# Patient Record
Sex: Female | Born: 1935 | Race: White | Hispanic: No | Marital: Married | Smoking: Never smoker
Health system: Southern US, Community
[De-identification: ages and names within clinical notes are randomized; demographics above are authoritative.]

## PROBLEM LIST (undated history)

## (undated) DIAGNOSIS — F0390 Unspecified dementia without behavioral disturbance: Secondary | ICD-10-CM

## (undated) DIAGNOSIS — F329 Major depressive disorder, single episode, unspecified: Secondary | ICD-10-CM

## (undated) DIAGNOSIS — J309 Allergic rhinitis, unspecified: Secondary | ICD-10-CM

## (undated) DIAGNOSIS — R32 Unspecified urinary incontinence: Secondary | ICD-10-CM

## (undated) DIAGNOSIS — F32A Depression, unspecified: Secondary | ICD-10-CM

## (undated) DIAGNOSIS — F1011 Alcohol abuse, in remission: Secondary | ICD-10-CM

## (undated) DIAGNOSIS — I6381 Other cerebral infarction due to occlusion or stenosis of small artery: Secondary | ICD-10-CM

## (undated) DIAGNOSIS — M24411 Recurrent dislocation, right shoulder: Secondary | ICD-10-CM

## (undated) DIAGNOSIS — G8929 Other chronic pain: Secondary | ICD-10-CM

## (undated) DIAGNOSIS — S065X9A Traumatic subdural hemorrhage with loss of consciousness of unspecified duration, initial encounter: Secondary | ICD-10-CM

## (undated) DIAGNOSIS — E039 Hypothyroidism, unspecified: Secondary | ICD-10-CM

## (undated) DIAGNOSIS — J45909 Unspecified asthma, uncomplicated: Secondary | ICD-10-CM

## (undated) DIAGNOSIS — K219 Gastro-esophageal reflux disease without esophagitis: Secondary | ICD-10-CM

## (undated) DIAGNOSIS — R1312 Dysphagia, oropharyngeal phase: Secondary | ICD-10-CM

## (undated) DIAGNOSIS — F1010 Alcohol abuse, uncomplicated: Secondary | ICD-10-CM

## (undated) DIAGNOSIS — G4700 Insomnia, unspecified: Secondary | ICD-10-CM

## (undated) DIAGNOSIS — M47812 Spondylosis without myelopathy or radiculopathy, cervical region: Secondary | ICD-10-CM

## (undated) DIAGNOSIS — E785 Hyperlipidemia, unspecified: Secondary | ICD-10-CM

## (undated) DIAGNOSIS — Z96659 Presence of unspecified artificial knee joint: Secondary | ICD-10-CM

## (undated) DIAGNOSIS — M1990 Unspecified osteoarthritis, unspecified site: Secondary | ICD-10-CM

## (undated) DIAGNOSIS — K631 Perforation of intestine (nontraumatic): Secondary | ICD-10-CM

## (undated) DIAGNOSIS — Z8679 Personal history of other diseases of the circulatory system: Secondary | ICD-10-CM

## (undated) DIAGNOSIS — M6530 Trigger finger, unspecified finger: Secondary | ICD-10-CM

## (undated) DIAGNOSIS — I951 Orthostatic hypotension: Secondary | ICD-10-CM

## (undated) DIAGNOSIS — Z8489 Family history of other specified conditions: Secondary | ICD-10-CM

## (undated) DIAGNOSIS — K56609 Unspecified intestinal obstruction, unspecified as to partial versus complete obstruction: Secondary | ICD-10-CM

## (undated) DIAGNOSIS — M069 Rheumatoid arthritis, unspecified: Secondary | ICD-10-CM

## (undated) DIAGNOSIS — R296 Repeated falls: Secondary | ICD-10-CM

## (undated) DIAGNOSIS — S065XAA Traumatic subdural hemorrhage with loss of consciousness status unknown, initial encounter: Secondary | ICD-10-CM

## (undated) DIAGNOSIS — I10 Essential (primary) hypertension: Secondary | ICD-10-CM

## (undated) HISTORY — DX: Unspecified osteoarthritis, unspecified site: M19.90

## (undated) HISTORY — DX: Gastro-esophageal reflux disease without esophagitis: K21.9

## (undated) HISTORY — DX: Major depressive disorder, single episode, unspecified: F32.9

## (undated) HISTORY — DX: Trigger finger, unspecified finger: M65.30

## (undated) HISTORY — DX: Rheumatoid arthritis, unspecified: M06.9

## (undated) HISTORY — DX: Allergic rhinitis, unspecified: J30.9

## (undated) HISTORY — DX: Hyperlipidemia, unspecified: E78.5

## (undated) HISTORY — DX: Hypothyroidism, unspecified: E03.9

## (undated) HISTORY — DX: Unspecified asthma, uncomplicated: J45.909

## (undated) HISTORY — PX: CARPAL TUNNEL RELEASE: SHX101

## (undated) HISTORY — PX: TOTAL KNEE ARTHROPLASTY: SHX125

## (undated) HISTORY — DX: Presence of unspecified artificial knee joint: Z96.659

## (undated) HISTORY — DX: Alcohol abuse, uncomplicated: F10.10

## (undated) HISTORY — DX: Other cerebral infarction due to occlusion or stenosis of small artery: I63.81

## (undated) HISTORY — DX: Essential (primary) hypertension: I10

## (undated) HISTORY — DX: Insomnia, unspecified: G47.00

## (undated) HISTORY — PX: ABDOMINAL HYSTERECTOMY: SHX81

## (undated) HISTORY — DX: Alcohol abuse, in remission: F10.11

## (undated) HISTORY — DX: Spondylosis without myelopathy or radiculopathy, cervical region: M47.812

## (undated) HISTORY — PX: BREAST SURGERY: SHX581

## (undated) HISTORY — DX: Recurrent dislocation, right shoulder: M24.411

## (undated) HISTORY — DX: Unspecified urinary incontinence: R32

## (undated) HISTORY — DX: Orthostatic hypotension: I95.1

## (undated) HISTORY — DX: Depression, unspecified: F32.A

## (undated) HISTORY — DX: Dysphagia, oropharyngeal phase: R13.12

## (undated) HISTORY — DX: Personal history of other diseases of the circulatory system: Z86.79

## (undated) HISTORY — PX: VAGINAL PROLAPSE REPAIR: SHX830

---

## 1996-06-01 HISTORY — PX: OTHER SURGICAL HISTORY: SHX169

## 1998-09-30 ENCOUNTER — Inpatient Hospital Stay (HOSPITAL_COMMUNITY): Admission: RE | Admit: 1998-09-30 | Discharge: 1998-10-05 | Payer: Self-pay | Admitting: Orthopedic Surgery

## 1998-09-30 HISTORY — PX: TOTAL KNEE ARTHROPLASTY: SHX125

## 1998-10-02 ENCOUNTER — Encounter: Payer: Self-pay | Admitting: Orthopedic Surgery

## 1998-11-12 ENCOUNTER — Encounter: Admission: RE | Admit: 1998-11-12 | Discharge: 1998-12-26 | Payer: Self-pay | Admitting: Orthopedic Surgery

## 1999-02-11 ENCOUNTER — Other Ambulatory Visit: Admission: RE | Admit: 1999-02-11 | Discharge: 1999-02-11 | Payer: Self-pay | Admitting: Family Medicine

## 1999-04-29 ENCOUNTER — Ambulatory Visit (HOSPITAL_BASED_OUTPATIENT_CLINIC_OR_DEPARTMENT_OTHER): Admission: RE | Admit: 1999-04-29 | Discharge: 1999-04-29 | Payer: Self-pay | Admitting: Orthopedic Surgery

## 1999-06-02 HISTORY — PX: CATARACT EXTRACTION: SUR2

## 1999-06-04 ENCOUNTER — Encounter: Admission: RE | Admit: 1999-06-04 | Discharge: 1999-07-25 | Payer: Self-pay | Admitting: Orthopedic Surgery

## 1999-08-21 ENCOUNTER — Encounter: Payer: Self-pay | Admitting: Family Medicine

## 1999-08-21 ENCOUNTER — Encounter: Admission: RE | Admit: 1999-08-21 | Discharge: 1999-08-21 | Payer: Self-pay | Admitting: Family Medicine

## 2002-02-07 ENCOUNTER — Encounter: Payer: Self-pay | Admitting: *Deleted

## 2002-02-07 ENCOUNTER — Encounter: Admission: RE | Admit: 2002-02-07 | Discharge: 2002-02-07 | Payer: Self-pay | Admitting: Family Medicine

## 2002-02-08 ENCOUNTER — Ambulatory Visit (HOSPITAL_BASED_OUTPATIENT_CLINIC_OR_DEPARTMENT_OTHER): Admission: RE | Admit: 2002-02-08 | Discharge: 2002-02-08 | Payer: Self-pay | Admitting: *Deleted

## 2002-07-31 HISTORY — PX: TOTAL KNEE ARTHROPLASTY: SHX125

## 2002-08-09 ENCOUNTER — Inpatient Hospital Stay (HOSPITAL_COMMUNITY): Admission: RE | Admit: 2002-08-09 | Discharge: 2002-08-13 | Payer: Self-pay | Admitting: Orthopedic Surgery

## 2002-08-11 ENCOUNTER — Encounter: Payer: Self-pay | Admitting: Orthopedic Surgery

## 2002-09-26 ENCOUNTER — Encounter: Admission: RE | Admit: 2002-09-26 | Discharge: 2002-10-16 | Payer: Self-pay | Admitting: Orthopedic Surgery

## 2005-06-01 HISTORY — PX: LUMBAR LAMINECTOMY: SHX95

## 2005-10-13 ENCOUNTER — Ambulatory Visit (HOSPITAL_COMMUNITY): Admission: RE | Admit: 2005-10-13 | Discharge: 2005-10-13 | Payer: Self-pay | Admitting: Neurosurgery

## 2005-11-04 ENCOUNTER — Encounter: Admission: RE | Admit: 2005-11-04 | Discharge: 2005-11-04 | Payer: Self-pay | Admitting: Neurosurgery

## 2005-11-30 ENCOUNTER — Encounter: Admission: RE | Admit: 2005-11-30 | Discharge: 2005-11-30 | Payer: Self-pay | Admitting: Neurosurgery

## 2006-11-13 ENCOUNTER — Emergency Department (HOSPITAL_COMMUNITY): Admission: EM | Admit: 2006-11-13 | Discharge: 2006-11-14 | Payer: Self-pay | Admitting: Emergency Medicine

## 2006-11-17 ENCOUNTER — Ambulatory Visit: Payer: Self-pay | Admitting: Psychiatry

## 2006-11-17 ENCOUNTER — Other Ambulatory Visit: Payer: Self-pay | Admitting: Emergency Medicine

## 2006-11-17 ENCOUNTER — Inpatient Hospital Stay (HOSPITAL_COMMUNITY): Admission: AD | Admit: 2006-11-17 | Discharge: 2006-11-22 | Payer: Self-pay | Admitting: Psychiatry

## 2007-03-10 ENCOUNTER — Encounter: Payer: Self-pay | Admitting: Internal Medicine

## 2007-03-10 ENCOUNTER — Inpatient Hospital Stay (HOSPITAL_COMMUNITY): Admission: EM | Admit: 2007-03-10 | Discharge: 2007-03-12 | Payer: Self-pay | Admitting: Emergency Medicine

## 2007-03-10 LAB — CONVERTED CEMR LAB
BUN: 15 mg/dL
CO2: 90 meq/L
Calcium: 9.3 mg/dL
Creatinine, Ser: 0.94 mg/dL
Potassium: 4.6 meq/L
Sodium: 141 meq/L

## 2007-03-28 ENCOUNTER — Ambulatory Visit (HOSPITAL_COMMUNITY): Admission: RE | Admit: 2007-03-28 | Discharge: 2007-03-28 | Payer: Self-pay | Admitting: Neurosurgery

## 2007-05-29 ENCOUNTER — Encounter: Payer: Self-pay | Admitting: Internal Medicine

## 2007-05-29 ENCOUNTER — Emergency Department (HOSPITAL_COMMUNITY): Admission: EM | Admit: 2007-05-29 | Discharge: 2007-05-30 | Payer: Self-pay | Admitting: Emergency Medicine

## 2007-05-29 LAB — CONVERTED CEMR LAB
AST: 16 units/L
Alkaline Phosphatase: 82 units/L
BUN: 21 mg/dL
CO2: 28 meq/L
Creatinine, Ser: 1.05 mg/dL
Glucose, Bld: 117 mg/dL
HCT: 32.4 %
Lymphocytes, automated: 8 %
MCV: 92.1 fL
Monocytes Relative: 11 %
Neutrophils Relative %: 80 %
Platelets: 416 10*3/uL
RDW: 16.2 %

## 2008-07-04 ENCOUNTER — Encounter: Payer: Self-pay | Admitting: Internal Medicine

## 2008-07-04 LAB — CONVERTED CEMR LAB
Cholesterol: 224 mg/dL
HDL: 56 mg/dL
LDL Cholesterol: 137 mg/dL
Triglyceride fasting, serum: 172 mg/dL

## 2008-07-16 ENCOUNTER — Encounter: Admission: RE | Admit: 2008-07-16 | Discharge: 2008-07-16 | Payer: Self-pay | Admitting: Internal Medicine

## 2008-11-07 ENCOUNTER — Encounter: Payer: Self-pay | Admitting: Internal Medicine

## 2008-11-07 LAB — CONVERTED CEMR LAB
ALT: 25 units/L
AST: 20 units/L
CO2: 24 meq/L
Glucose, Bld: 71 mg/dL
Potassium: 4.2 meq/L
Sodium: 141 meq/L

## 2009-03-25 ENCOUNTER — Encounter: Admission: RE | Admit: 2009-03-25 | Discharge: 2009-03-25 | Payer: Self-pay | Admitting: Neurosurgery

## 2009-07-05 ENCOUNTER — Encounter: Payer: Self-pay | Admitting: Internal Medicine

## 2009-07-05 LAB — CONVERTED CEMR LAB
Albumin: 3.6 g/dL
Alkaline Phosphatase: 69 units/L
CO2: 19 meq/L
Calcium: 10.4 mg/dL
Cholesterol: 259 mg/dL
Creatinine, Ser: 1.1 mg/dL
Glucose, Bld: 69 mg/dL
HDL: 65 mg/dL
LDL Cholesterol: 163 mg/dL
Platelets: 446 10*3/uL
RBC: 4 M/uL
Sodium: 142 meq/L
TSH: 1.78 microintl units/mL
Total Bilirubin: 0.3 mg/dL
WBC: 10.8 10*3/uL

## 2009-10-02 ENCOUNTER — Encounter: Admission: RE | Admit: 2009-10-02 | Discharge: 2009-10-02 | Payer: Self-pay | Admitting: Neurosurgery

## 2009-12-07 ENCOUNTER — Emergency Department (HOSPITAL_COMMUNITY)
Admission: EM | Admit: 2009-12-07 | Discharge: 2009-12-07 | Payer: Self-pay | Source: Home / Self Care | Admitting: Emergency Medicine

## 2009-12-19 ENCOUNTER — Encounter: Payer: Self-pay | Admitting: Internal Medicine

## 2009-12-19 LAB — CONVERTED CEMR LAB
CO2: 23 meq/L
Calcium: 10.7 mg/dL
Glucose, Bld: 104 mg/dL

## 2009-12-24 ENCOUNTER — Encounter: Admission: RE | Admit: 2009-12-24 | Discharge: 2009-12-24 | Payer: Self-pay | Admitting: Gastroenterology

## 2010-01-27 ENCOUNTER — Encounter: Admission: RE | Admit: 2010-01-27 | Discharge: 2010-01-27 | Payer: Self-pay | Admitting: Neurological Surgery

## 2010-05-06 ENCOUNTER — Encounter: Admission: RE | Admit: 2010-05-06 | Discharge: 2010-05-06 | Payer: Self-pay | Admitting: Orthopedic Surgery

## 2010-06-01 HISTORY — PX: COLPOCLEISIS VAGINAL LE FORT: SUR279

## 2010-06-22 ENCOUNTER — Encounter: Payer: Self-pay | Admitting: Neurosurgery

## 2010-07-09 ENCOUNTER — Encounter: Payer: Self-pay | Admitting: Internal Medicine

## 2010-07-09 LAB — CONVERTED CEMR LAB
ALT: 19 units/L
AST: 20 units/L
CO2: 29 meq/L
Calcium: 9.9 mg/dL
Chloride: 105 meq/L
Cholesterol: 235 mg/dL
Glucose, Bld: 84 mg/dL
HDL: 62 mg/dL
LDL Cholesterol: 153 mg/dL
Potassium: 4 meq/L
Sodium: 143 meq/L
TSH: 3.38 microintl units/mL
Triglyceride fasting, serum: 101 mg/dL

## 2010-08-04 ENCOUNTER — Encounter: Payer: Self-pay | Admitting: Internal Medicine

## 2010-08-04 DIAGNOSIS — Z96659 Presence of unspecified artificial knee joint: Secondary | ICD-10-CM | POA: Insufficient documentation

## 2010-08-04 DIAGNOSIS — F329 Major depressive disorder, single episode, unspecified: Secondary | ICD-10-CM | POA: Insufficient documentation

## 2010-08-04 DIAGNOSIS — F1011 Alcohol abuse, in remission: Secondary | ICD-10-CM

## 2010-08-04 DIAGNOSIS — G4700 Insomnia, unspecified: Secondary | ICD-10-CM | POA: Insufficient documentation

## 2010-08-04 DIAGNOSIS — F32A Depression, unspecified: Secondary | ICD-10-CM | POA: Insufficient documentation

## 2010-08-04 DIAGNOSIS — M1990 Unspecified osteoarthritis, unspecified site: Secondary | ICD-10-CM | POA: Insufficient documentation

## 2010-08-04 HISTORY — DX: Alcohol abuse, in remission: F10.11

## 2010-08-04 HISTORY — DX: Presence of unspecified artificial knee joint: Z96.659

## 2010-08-11 ENCOUNTER — Ambulatory Visit (INDEPENDENT_AMBULATORY_CARE_PROVIDER_SITE_OTHER): Payer: Medicare Other | Admitting: Internal Medicine

## 2010-08-11 ENCOUNTER — Encounter: Payer: Self-pay | Admitting: Internal Medicine

## 2010-08-11 DIAGNOSIS — K219 Gastro-esophageal reflux disease without esophagitis: Secondary | ICD-10-CM | POA: Insufficient documentation

## 2010-08-11 DIAGNOSIS — I10 Essential (primary) hypertension: Secondary | ICD-10-CM | POA: Insufficient documentation

## 2010-08-11 DIAGNOSIS — E785 Hyperlipidemia, unspecified: Secondary | ICD-10-CM | POA: Insufficient documentation

## 2010-08-11 DIAGNOSIS — M129 Arthropathy, unspecified: Secondary | ICD-10-CM | POA: Insufficient documentation

## 2010-08-11 DIAGNOSIS — J45909 Unspecified asthma, uncomplicated: Secondary | ICD-10-CM | POA: Insufficient documentation

## 2010-08-11 DIAGNOSIS — E039 Hypothyroidism, unspecified: Secondary | ICD-10-CM

## 2010-08-11 DIAGNOSIS — M069 Rheumatoid arthritis, unspecified: Secondary | ICD-10-CM

## 2010-08-11 DIAGNOSIS — F329 Major depressive disorder, single episode, unspecified: Secondary | ICD-10-CM

## 2010-08-11 DIAGNOSIS — J309 Allergic rhinitis, unspecified: Secondary | ICD-10-CM | POA: Insufficient documentation

## 2010-08-11 DIAGNOSIS — R32 Unspecified urinary incontinence: Secondary | ICD-10-CM | POA: Insufficient documentation

## 2010-08-11 HISTORY — DX: Unspecified asthma, uncomplicated: J45.909

## 2010-08-11 HISTORY — DX: Allergic rhinitis, unspecified: J30.9

## 2010-08-13 DIAGNOSIS — M069 Rheumatoid arthritis, unspecified: Secondary | ICD-10-CM | POA: Insufficient documentation

## 2010-08-13 DIAGNOSIS — E039 Hypothyroidism, unspecified: Secondary | ICD-10-CM | POA: Insufficient documentation

## 2010-08-14 ENCOUNTER — Telehealth (INDEPENDENT_AMBULATORY_CARE_PROVIDER_SITE_OTHER): Payer: Self-pay | Admitting: *Deleted

## 2010-08-15 ENCOUNTER — Encounter: Payer: Self-pay | Admitting: Internal Medicine

## 2010-08-16 ENCOUNTER — Emergency Department (HOSPITAL_COMMUNITY): Payer: Medicare Other

## 2010-08-16 ENCOUNTER — Emergency Department (HOSPITAL_COMMUNITY)
Admission: EM | Admit: 2010-08-16 | Discharge: 2010-08-16 | Disposition: A | Discharge status: Home / Self Care | Payer: Medicare Other | Attending: Emergency Medicine | Admitting: Emergency Medicine

## 2010-08-16 DIAGNOSIS — W010XXA Fall on same level from slipping, tripping and stumbling without subsequent striking against object, initial encounter: Secondary | ICD-10-CM | POA: Insufficient documentation

## 2010-08-16 DIAGNOSIS — S0003XA Contusion of scalp, initial encounter: Secondary | ICD-10-CM | POA: Insufficient documentation

## 2010-08-16 DIAGNOSIS — M542 Cervicalgia: Secondary | ICD-10-CM | POA: Insufficient documentation

## 2010-08-16 DIAGNOSIS — R51 Headache: Secondary | ICD-10-CM | POA: Insufficient documentation

## 2010-08-16 DIAGNOSIS — M129 Arthropathy, unspecified: Secondary | ICD-10-CM | POA: Insufficient documentation

## 2010-08-16 DIAGNOSIS — I4891 Unspecified atrial fibrillation: Secondary | ICD-10-CM | POA: Insufficient documentation

## 2010-08-16 DIAGNOSIS — S0990XA Unspecified injury of head, initial encounter: Secondary | ICD-10-CM | POA: Insufficient documentation

## 2010-08-16 DIAGNOSIS — M549 Dorsalgia, unspecified: Secondary | ICD-10-CM | POA: Insufficient documentation

## 2010-08-16 DIAGNOSIS — E039 Hypothyroidism, unspecified: Secondary | ICD-10-CM | POA: Insufficient documentation

## 2010-08-16 DIAGNOSIS — M109 Gout, unspecified: Secondary | ICD-10-CM | POA: Insufficient documentation

## 2010-08-19 NOTE — Assessment & Plan Note (Signed)
Summary: New pt/Medicare/#/cd   Vital Signs:  Patient profile:   77 year old female Height:      60 inches (152.40 cm) Weight:      123.8 pounds (56.27 kg) BMI:     24.27 O2 Sat:      97 % on Room air Temp:     98.1 degrees F (36.72 degrees C) oral Pulse rate:   83 / minute BP sitting:   132 / 72  (left arm) Cuff size:   regular  Vitals Entered By: Orlan Leavens RMA (August 11, 2010 3:25 PM)  O2 Flow:  Room air CC: New patient Is Patient Diabetic? No Pain Assessment Patient in pain? no        Primary Care Provider:  Newt Lukes MD  CC:  New patient.  History of Present Illness: new pt to me and our group, here to est care - prev at [facility]   reviewed chronic med issues -  chronic pain - complicated by severe arthritis, ?RA - hands, wrists, shoulders - follows with ortho ([facility]) and planning to see hand re: L left wrist pain and swelling, hx fx 09/2006 followinfall - uses sched norco and takes cymbalta for this and depression symptoms   severe gout - on chronic pred - prev tophi but all resolved with chroinc pred use -  depression and anxiety - complicated by hx alcohol abuse - chronic sched BZ - reports compliance with ongoing medical treatment and no changes in medication dose or frequency. denies adverse side effects related to current therapy.   hypothyroid - reports compliance with ongoing medical treatment and no changes in medication dose or frequency. denies adverse side effects related to current therapy.   Preventive Screening-Counseling & Management  Alcohol-Tobacco     Alcohol drinks/day: 0     Smoking Status: never  Caffeine-Diet-Exercise     Does Patient Exercise: no     Exercise Counseling: to improve exercise regimen  Comments: hx Etoh abise - sober since 2010 (intermitent periods of sobreity per spouse)  Safety-Violence-Falls     Seat Belt Counseling: not indicated; patient wears seat belts     Helmet Counseling: not applicable     Violence  Counseling: not indicated; no violence risk noted     Fall Risk Counseling: counseling provided; falls with injury noted  Clinical Review Panels:  CBC   WBC:  11.1 (05/29/2007)   RBC:  3.52 (05/29/2007)   Hgb:  11.0 (05/29/2007)   Hct:  32.4 (05/29/2007)   Platelets:  416 (05/29/2007)   MCV  92.1 (05/29/2007)   RDW  16.2 (05/29/2007)   PMN:  80 (05/29/2007)   Monos:  11 (05/29/2007)   Eosinophils:  1 (05/29/2007)   Basophil:  1 (05/29/2007)  Complete Metabolic Panel   Glucose:  84 (07/09/2010)   Sodium:  143 (07/09/2010)   Potassium:  4.0 (07/09/2010)   Chloride:  105 (07/09/2010)   CO2:  29 (07/09/2010)   BUN:  24 (07/09/2010)   Creatinine:  0.9 (07/09/2010)   Albumin:  3.6 (07/09/2010)   Total Protein:  6.0 (07/09/2010)   Calcium:  9.9 (07/09/2010)   Total Bili:  0.4 (07/09/2010)   Alk Phos:  92 (07/09/2010)   SGPT (ALT):  19 (07/09/2010)   SGOT (AST):  20 (07/09/2010)   -  Date:  07/09/2010    BG Random: 84    BUN: 24    Creatinine: 0.9    Sodium: 143    Potassium: 4.0  Chloride: 105    CO2 Total: 29    SGOT (AST): 20    SGPT (ALT): 19    T. Bilirubin: 0.4    Alk Phos: 92    Calcium: 9.9    Total Protein: 6.0    Albumin: 3.6    Cholesterol: 235    LDL: 153    HDL: 62    Triglycerides: 045    TSH: 3.38  Current Medications (verified): 1)  Lasix 40 Mg Tabs (Furosemide) .... Take 1/2 By Mouth Once Daily 2)  Claritin 10 Mg Tabs (Loratadine) .... Take 1 By Mouth Once Daily 3)  Synthroid 100 Mcg Tabs (Levothyroxine Sodium) .... Take 1 By Mouth Once Daily 4)  Klor-Con M20 20 Meq Cr-Tabs (Potassium Chloride Crys Cr) .... Take 1 By Mouth Once Daily 5)  Alprazolam 0.5 Mg Tabs (Alprazolam) .... Take As Needed 6)  Prednisone 10 Mg Tabs (Prednisone) .... Take 1 By Mouth Once Daily 7)  Protonix 40 Mg Tbec (Pantoprazole Sodium) .... Take 1 By Mouth Once Daily 8)  Ipratropium Bromide 0.02 % Soln (Ipratropium Bromide) .... Use As Needed 9)  Gabapentin 300 Mg  Caps (Gabapentin) .... Take 1 By Mouth Two Times A Day 10)  Aricept 10 Mg Tabs (Donepezil Hcl) .... Take 1 By Mouth Once Daily 11)  Dilt-Cd 180 Mg Xr24h-Cap (Diltiazem Hcl Coated Beads) .... Take 1 By Mouth Once Daily 12)  Colcrys 0.6 Mg Tabs (Colchicine) .... Take As Needed 13)  Metoprolol Succinate 100 Mg Xr24h-Tab (Metoprolol Succinate) .... Take 1 At Bedtime 14)  Cymbalta 60 Mg Cpep (Duloxetine Hcl) .... Take 1 At Bedtime 15)  Hydrocodone-Acetaminophen 10-325 Mg Tabs (Hydrocodone-Acetaminophen) .... Take 1 At Bedtime 16)  Proventil Hfa 108 (90 Base) Mcg/act Aers (Albuterol Sulfate) .... Use As Needed 17)  Tylenol Extra Strength 500 Mg Tabs (Acetaminophen) .... Take As Needed 18)  Tramadol Hcl 50 Mg Tabs (Tramadol Hcl) .... Take 1-2 By Mouth As Needed 19)  Prednisolone Acetate 1 % Susp (Prednisolone Acetate) .... Use As Needed 20)  Biotin 300 Mcg Tabs (Biotin) .... Take 1 Evening 21)  Vitamin E 400 Unit Caps (Vitamin E) .... Take 1 By Mouth Once Daily 22)  Selenium 100 Mcg Tabs (Selenium) .... Take 1 By Mouth in The Evening 23)  Fish Oil 1200 Mg Caps (Omega-3 Fatty Acids) .... Take 1 By Mouth in The Evening 24)  Multivitamins  Tabs (Multiple Vitamin) .... Take 1 By Mouth Once Daily 25)  Melatonin 5 Mg Tabs (Melatonin) .... Take 1 At Bedtime 26)  I-Caps .... Take 1 By Mouth At Bedtime  Allergies (verified): 1)  ! * Azithromycin 2)  ! Vasotec 3)  ! Allopurinol 4)  * Valtrex  Past History:  Past Medical History: Depression Osteoarthritis - ?RA and gout - chronic pred use Allergic rhinitis Asthma GERD Hyperlipidemia Hypertension Urinary incontinence vaginal prolapse s/p repair 06/2008  md roster -  ortho - [facility] [place], [place] for women - [facility]  Past Surgical History: Hysterectomy (1977) Breast biopsy (1961) repair vag prolapse 06/2008 TKR, right 07/2002 TKR, L 09/1998  Family History: Family History of Arthritis Family  History Diabetes 1st degree relative Family History Hypertension Family History Lung cancer Family History of Stroke M 1st degree relative <50  Social History: married, lives with spouse never smoked hx alcohol abuse 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 I have reviewed all other systems and they were negative.   Physical Exam  General:  alert, well-developed, well-nourished, and cooperative to examination.   spouse at side Head:  Normocephalic and atraumatic without obvious abnormalities. No apparent alopecia or balding. Eyes:  vision grossly intact.   Ears:  R ear normal and L ear normal.   Mouth:  teeth and gums in good repair; mucous membranes moist, without lesions or ulcers. oropharynx clear without exudate, no erythema.  Lungs:  normal respiratory effort, no intercostal retractions or use of accessory muscles;fair breath sounds bilaterally - no crackles and no wheezes.    Heart:  normal rate, regular rhythm, no murmur, and no rub. BLE without edema. Abdomen:  soft, non-tender, normal bowel sounds, no distention; no masses and no appreciable hepatomegaly or splenomegaly.   Genitalia:  defer Msk:  pannus deformity of left wrist - other chronic arthritis changes at MCP - limited ROM B shoulders - no tophi at this time Neurologic:  alert & oriented X3 and cranial nerves II-XII symetrically intact.  strength normal in all extremities, sensation intact to light touch, and gait normal. speech fluent without dysarthria or aphasia; follows commands with good comprehension.  Psych:  Oriented X3, memory intact for recent and remote, normally interactive, good eye contact, not anxious appearing, not depressed appearing, and not agitated.      Impression & Recommendations:  Problem # 1:  HYPOTHYROIDISM (ICD-244.9) will send for records The following medications were removed from the medication  list:    Synthroid 112 Mcg Tabs (Levothyroxine sodium) .Marland Kitchen... Take 1 by mouth once daily Her updated medication list for this problem includes:    Synthroid 100 Mcg Tabs (Levothyroxine sodium) .Marland Kitchen... Take 1 by mouth once daily  Labs Reviewed: TSH: 0.119 (03/10/2007)     Problem # 2:  RHEUMATOID ARTHRITIS (ICD-714.0) suspect this as primary arthritic dx based on exam (left wrist, mcp and b shoulders) but complicated by hx gout and pred use - will send for prior w/u and rec no med change at this time -pred and sched norco  Problem # 3:  DEPRESSION (ICD-311) complicated by anxiety and alcohol abuse hx - no med change rec - send fopr prior records The following medications were removed from the medication list:    Cymbalta 60 Mg Cpep (Duloxetine hcl) .Marland Kitchen... Take 1 by mouth once daily Her updated medication list for this problem includes:    Alprazolam 0.5 Mg Tabs (Alprazolam) .Marland Kitchen... Take two times a day as needed    Cymbalta 60 Mg Cpep (Duloxetine hcl) .Marland Kitchen... Take 1 at bedtime Time spent with patient and spouse 35 minutes, more than 50% of this time was spent counseling patient on depression, arthritis and chronic pain and review of recent l;abs and medications - plan to send for additional records should pt decide to follow here rather than return to her prior pcp  Complete Medication List: 1)  Lasix 40 Mg Tabs (Furosemide) .... Take 1/2 by mouth once daily 2)  Claritin-d 12 Hour 5-120 Mg Xr12h-tab (Loratadine-pseudoephedrine) .Marland Kitchen.. 1 by mouth  two times a day 3)  Synthroid 100 Mcg Tabs (Levothyroxine sodium) .... Take 1 by mouth once daily 4)  Klor-con M20 20 Meq Cr-tabs (Potassium chloride crys cr) .... Take 1 by mouth once daily 5)  Alprazolam 0.5 Mg Tabs (Alprazolam) .... Take two times a day as needed 6)  Prednisone 10 Mg Tabs (Prednisone) .... Take 1 by mouth once daily 7)  Protonix 40  Mg Tbec (Pantoprazole sodium) .... Take 1 by mouth once daily 8)  Atrovent 0.06 % Soln (Ipratropium  bromide) .... Spray nostril  every morning as needed 9)  Gabapentin 300 Mg Caps (Gabapentin) .... Take 1 by mouth two times a day 10)  Aricept 10 Mg Tabs (Donepezil hcl) .... Take 1 by mouth once daily 11)  Dilt-cd 180 Mg Xr24h-cap (Diltiazem hcl coated beads) .... Take 1 by mouth once daily 12)  Colcrys 0.6 Mg Tabs (Colchicine) .Marland Kitchen.. 1 by mouth once daily 13)  Metoprolol Succinate 100 Mg Xr24h-tab (Metoprolol succinate) .... Take 1 at bedtime 14)  Cymbalta 60 Mg Cpep (Duloxetine hcl) .... Take 1 at bedtime 15)  Hydrocodone-acetaminophen 10-325 Mg Tabs (Hydrocodone-acetaminophen) .... Take 1 by mouth two times a day 16)  Proventil Hfa 108 (90 Base) Mcg/act Aers (Albuterol sulfate) .... Use as needed 17)  Tylenol Extra Strength 500 Mg Tabs (Acetaminophen) .... Take as needed 18)  Tramadol Hcl 50 Mg Tabs (Tramadol hcl) .... Take 1-2 by mouth as needed 19)  Prednisolone Acetate 1 % Susp (Prednisolone acetate) .... Use as needed 20)  Biotin 300 Mcg Tabs (Biotin) .... Take 1 evening 21)  Vitamin E 400 Unit Caps (Vitamin e) .... Take 1 by mouth once daily 22)  Selenium 100 Mcg Tabs (Selenium) .... Take 1 by mouth in the evening 23)  Fish Oil 1200 Mg Caps (Omega-3 fatty acids) .... Take 1 by mouth in the evening 24)  Multivitamins Tabs (Multiple vitamin) .... Take 1 by mouth once daily 25)  Melatonin 5 Mg Tabs (Melatonin) .... Take 1 at bedtime 26)  I-caps  .... Take 1 by mouth at bedtime 27)  Uloric 40 Mg Tabs (Febuxostat) .Marland Kitchen.. 1 by mouth  every morning 28)  Theophylline Cr 200 Mg Xr12h-tab (Theophylline) .Marland Kitchen.. 1 by mouth at bedtime  Patient Instructions: 1)  it was good to see you today. 2)  medications and history reviewed - no changes today 3)  will send for records from dr. Jarold Motto to review 4)  Please schedule a follow-up appointment in 2-3 months to continue review, call sooner if problems.    Orders Added: 1)  New Patient Level III [40981]

## 2010-08-19 NOTE — Progress Notes (Signed)
  Phone Note Other Incoming   Request: Send information Summary of Call: Records received from [facility]. 117 pages forwarded to Dr. Felicity Coyer for review.

## 2010-08-21 ENCOUNTER — Encounter: Payer: Self-pay | Admitting: Internal Medicine

## 2010-08-21 ENCOUNTER — Ambulatory Visit (INDEPENDENT_AMBULATORY_CARE_PROVIDER_SITE_OTHER)
Admission: RE | Admit: 2010-08-21 | Discharge: 2010-08-21 | Disposition: A | Discharge status: Home / Self Care | Payer: Medicare Other | Source: Ambulatory Visit | Attending: Internal Medicine | Admitting: Internal Medicine

## 2010-08-21 ENCOUNTER — Other Ambulatory Visit: Payer: Medicare Other

## 2010-08-21 ENCOUNTER — Ambulatory Visit: Payer: BC Managed Care – PPO | Admitting: Internal Medicine

## 2010-08-21 ENCOUNTER — Ambulatory Visit (INDEPENDENT_AMBULATORY_CARE_PROVIDER_SITE_OTHER): Payer: Medicare Other | Admitting: Internal Medicine

## 2010-08-21 VITALS — BP 142/70 | HR 92 | Temp 97.7°F | Resp 14 | Wt 123.5 lb

## 2010-08-21 DIAGNOSIS — S0993XA Unspecified injury of face, initial encounter: Secondary | ICD-10-CM

## 2010-08-21 DIAGNOSIS — S199XXA Unspecified injury of neck, initial encounter: Secondary | ICD-10-CM

## 2010-08-21 DIAGNOSIS — S0990XA Unspecified injury of head, initial encounter: Secondary | ICD-10-CM

## 2010-08-21 NOTE — Patient Instructions (Signed)
Head Injuries, Adult You have had a head injury which does not appear serious at this time. A concussion is a state of changed mental ability, usually from a blow to the head. You should take clear liquids for the rest of the day and then resume your regular diet. You should not take sedatives or alcoholic beverages forever hours after discharge. After injuries such as yours, most problems occur within the first 24 hours.  THESE MINOR SYMPTOMS MAY BE SEEN AFTER DISCHARGE:  Memory difficulties  Dizziness   Headaches   Double vision  Hearing difficulties   Depression   Tiredness  Weakness   Difficulty with concentration   If you experience any of these problems, you should not be alarmed. A concussion requires a few days for recovery. Many patients with head injuries frequently experience such symptoms. Usually, these problems disappear without medical care. If symptoms last for more than one day, notify your caregiver. See your caregiver sooner if symptoms are becoming worse rather than better. HOME CARE INSTRUCTIONS  During the next 24 hours you must stay with someone who can watch you for the warning signs listed below.  Although it is unlikely that serious side effects will occur, you should be aware of signs and symptoms which may necessitate your return to this location. Side effects may occur up to 7 - 10 days following the injury. It is important for you to carefully monitor your condition and contact your caregiver or seek immediate medical attention if there is a change in your condition. 1. SEEK IMMEDIATE MEDICAL CARE IF:   There is confusion or drowsiness.   You can not awaken the injured person.   There is nausea (feeling sick to your stomach) or continued, forceful vomiting.   You notice dizziness or unsteadiness which is getting worse, or inability to walk.   You have convulsions or unconsciousness.   You experience severe, persistent headaches not relieved by  over-the-counter or prescription medicines for pain. (Do not take aspirin as this impairs clotting abilities). Take other pain medications only as directed.   You can not use arms or legs normally.   There is clear or bloody discharge from the nose or ears.  MAKE SURE YOU:   Understand these instructions.   Will watch your condition.   Will get help right away if you are not doing well or get worse.  Document Released: 05/18/2005 Document Re-Released: 05/06/2009 [facility] Patient Information 2011 [place], [place].

## 2010-08-21 NOTE — Progress Notes (Signed)
Subjective:    Patient ID: Connie Fitzpatrick, female    DOB: 08/24/1927, 77 y.o.   MRN: [id number]  HPI New to me she was scheduled to see Dr. Felicity Coyer but missed her appt and is seeing me. She has had 2 falls in the last 5 days. After the first fall she went to the ER and has normal Xrays but she fell again 3 days ago. She says that she is falling "b/c I am old." She describes losing her balance spontaneoulsy with no warning or palpitations. Today she tells me that something does not fell right in her head and neck. She has diffuse neck pain and diffuse head pain that shoots into her ears. She has not had an LOC, nausea, vomiting, or dizziness. She has not had any N/W/T or slurred speech. She has pain meds and ? muscle relaxer that she is taking for discomfort.   Review of Systems  Constitutional: Positive for activity change. Negative for fever, chills, diaphoresis, appetite change, fatigue and unexpected weight change.  HENT: Positive for ear pain, neck pain and neck stiffness. Negative for hearing loss, nosebleeds, congestion, facial swelling, rhinorrhea and ear discharge.   Eyes: Negative for photophobia, pain, redness and visual disturbance.  Respiratory: Negative for cough, chest tightness, shortness of breath and stridor.   Cardiovascular: Negative for chest pain, palpitations and leg swelling.  Gastrointestinal: Negative for abdominal pain and anal bleeding.  Genitourinary: Negative for dysuria, frequency, hematuria and difficulty urinating.  Musculoskeletal: Positive for arthralgias. Negative for myalgias, back pain and gait problem.  Skin: Negative for color change, pallor, rash and wound.  Neurological: Positive for syncope and headaches. Negative for dizziness, tremors, seizures, speech difficulty, weakness, light-headedness and numbness.  Hematological: Negative for adenopathy. Does not bruise/bleed easily.  Psychiatric/Behavioral: Negative for suicidal ideas, hallucinations, behavioral  problems, confusion, sleep disturbance, self-injury, dysphoric mood, decreased concentration and agitation. The patient is not nervous/anxious and is not hyperactive.    No past medical history on file. No past surgical history on file.  reports that she has never smoked. She does not have any smokeless tobacco history on file. Her alcohol and drug histories not on file. family history is not on file. Allergies  Allergen Reactions  . Allopurinol     REACTION: Skin rash  . Azithromycin   . Enalapril Maleate     REACTION: swelling of mouth \\T \ throat  . Valacyclovir Hcl       Objective:   Physical Exam  Constitutional: She is oriented to person, place, and time. She appears well-developed and well-nourished. No distress.  HENT:  Head: Normocephalic. Head is without raccoon's eyes, without Battle's sign, without abrasion, without contusion, without laceration, without right periorbital erythema and without left periorbital erythema.  Right Ear: External ear normal.  Left Ear: External ear normal.  Nose: Nose normal.  Mouth/Throat: Oropharynx is clear and moist. No oropharyngeal exudate.  Eyes: Conjunctivae and EOM are normal. Pupils are equal, round, and reactive to light. Right eye exhibits no discharge. Left eye exhibits no discharge. No scleral icterus.  Neck: Neck supple. No tracheal tenderness, no spinous process tenderness and no muscular tenderness present. No rigidity. Decreased range of motion present. No edema and no erythema present. No Kernig's sign noted. No thyromegaly present.  Cardiovascular: Normal rate, regular rhythm and intact distal pulses.  Exam reveals no gallop and no friction rub.   No murmur heard. Pulmonary/Chest: Effort normal and breath sounds normal. No stridor. No respiratory distress. She has no wheezes. She  has no rales. She exhibits no tenderness.  Abdominal: She exhibits no distension and no mass. There is no tenderness. There is no rebound and no  guarding.  Musculoskeletal:       She has diffuse rheumatoid nodules and deviations in her joints.  Neurological: She is alert and oriented to person, place, and time. She displays normal reflexes. She exhibits normal muscle tone. Coordination normal.  Skin: Skin is warm and dry. No rash noted. She is not diaphoretic. No erythema. No pallor.  Psychiatric: She has a normal mood and affect. Her behavior is normal. Judgment and thought content normal.          Assessment & Plan:

## 2010-09-25 ENCOUNTER — Other Ambulatory Visit: Payer: Self-pay | Admitting: *Deleted

## 2010-09-25 MED ORDER — HYDROCODONE-ACETAMINOPHEN 10-325 MG PO TABS: 2.0000 | ORAL_TABLET | Freq: Two times a day (BID) | ORAL | Status: DC

## 2010-09-25 MED ORDER — ALPRAZOLAM 0.5 MG PO TABS: 0.5000 mg | ORAL_TABLET | Freq: Three times a day (TID) | ORAL | Status: DC

## 2010-09-25 NOTE — Telephone Encounter (Signed)
Faxed script back to [facility]/[address] [phone]

## 2010-10-13 ENCOUNTER — Other Ambulatory Visit: Payer: Self-pay | Admitting: Internal Medicine

## 2010-10-14 NOTE — H&P (Signed)
NAME:  Connie Fitzpatrick, Connie Fitzpatrick                  ACCOUNT NO.:  [id number]   MEDICAL RECORD NO.:  [id number]          PATIENT TYPE:  INP   LOCATION:  3707                         FACILITY:  [facility]   PHYSICIAN:  Hilda Lias, M.D.   DATE OF BIRTH:  21-Jan-1928   DATE OF ADMISSION:  03/10/2007  DATE OF DISCHARGE:                              HISTORY & PHYSICAL   Connie Fitzpatrick is a lady who was brought to the emergency room after she  fell.  She was seen by the emergency room physician.  It was found that  she had a laceration of the left eye.  She underwent CT scan of the head  and because of the finding, we were called for evaluation.  The last  time I saw this lady was several months ago with back pain.  She  underwent decompressive laminectomy about 11 years ago.  Unknown to me,  she came to the emergency room in June of this year after she fell, and  a CT scan at that time was negative.  The CT scan this time is positive  for a small subdural hematoma in the left frontal area.   PAST MEDICAL HISTORY:  1. Knee replacement.  2. Lumbar laminectomy.  3. Carpal tunnel surgery.  4. Hysterectomy.   FAMILY HISTORY:  Unremarkable.   REVIEW OF SYSTEMS:  Right now is positive for some headache.   PHYSICAL EXAMINATION:  HEENT:  Racoon eyes.  There is a laceration in  the left supraorbital area.  There is a well-healed laceration in the  right supraorbital area.  There is no evidence of any CSF or blood  coming from the nose or from the ear.  NECK:  Normal.  LUNGS:  Clear.  HEART:  Heart sounds normal.  EXTREMITIES:  Normal pulses.  NEUROLOGIC:  The patient is oriented x3.  She does not recall the name  of her medical doctor.  Nevertheless, I was able to smell some alcohol  on her breath.  Strength is normal.  Sensation normal.  Coordination was  not tested.   CT scan showed that she has a small subdural hematoma in the left  frontal area with no displacement.  The alcohol level is positive in  blood  for over 120.   IMPRESSION:  Closed head injury with a small subdural hematoma.  No  evidence of surgical lesion.  Alcohol intoxication.   RECOMMENDATIONS:  The patient is going to be admitted to the hospital  for 24-hour observation.  We are going to repeat the CT scan in the next  24 hours.  She is going to be taking the medication at home.  Her  medical doctor,  Dr. Jarome Matin, is going to be called to help [place] with the care of  this lady, not only for her medical problem but the alcohol  intoxication.  It is important to note that right now it is 6:30 in the  afternoon, and the alcohol level was __________  4 hours ago.           ______________________________  Hilda Lias, M.D.  EB/MEDQ  D:  03/10/2007  T:  03/11/2007  Job:  [id number]

## 2010-10-14 NOTE — Consult Note (Signed)
Connie Fitzpatrick, Connie Fitzpatrick                  ACCOUNT NO.:  [id number]   MEDICAL RECORD NO.:  [id number]          PATIENT TYPE:  INP   LOCATION:  3707                         FACILITY:  [facility]   PHYSICIAN:  Antonietta Breach, M.D.  DATE OF BIRTH:  19-May-1928   DATE OF CONSULTATION:  03/11/2007  DATE OF DISCHARGE:                                 CONSULTATION   REQUESTING PHYSICIAN:  Hilda Lias, M.D.   REASON FOR CONSULTATION:  Depression and alcohol dependence.   HISTORY OF PRESENT ILLNESS:  Mrs. Connie Fitzpatrick is a 77 year old female  admitted to the [facility] on March 10, 2007, due to a fall.   The patient did sustain a left orbit laceration as well as a small,  subdural hematoma.  She has been drinking alcohol again after a period  of abstinence.  She does have a long-term history of alcohol dependence.   She describes normal interests and constructive future goals.  She has a  mild decrease in energy and concentration.  She has no thoughts of  harming herself, no thoughts of harming others.  She has no delusions or  hallucinations.  She was admitted with alcohol intoxication.  She  minimizes the amount of alcohol that she has been drinking.  It is  therefore unclear how much liquor she has been drinking per day.   PAST PSYCHIATRIC HISTORY:  The patient does have a history in the past  of drinking four pints of vodka per week.  In the early part of this  summer, through her primary care physician and her husband, she agrees  to enter the [facility] for a dual diagnosis  treatment program.   She states that she did maintain a period of abstinence after that  admission.   While there, she was treated for depression which included insomnia.  She was treated with Cymbalta and was discharged on Cymbalta 60 mg daily  on November 22, 2006.  The patient relapsed on alcohol this past week.   She denies any history of decreased need for sleep or expansive mood  with increased energy.  She denies hallucinations or delusions.  She  never has had other psychiatric admissions.  She has not made any  suicide attempts.   She denies other alcohol rehabilitation programs.  She does not use  illegal drugs.   FAMILY PSYCHIATRIC HISTORY:  None known.   SOCIAL HISTORY:  Mrs. Connie Fitzpatrick is married.  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  She does  not use any illegal drugs.  Religion is [place].  The patient was  trained as a Engineer, civil (consulting) in [place], [place].  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   PAST MEDICAL HISTORY:  1. Knee replacement.  2. Lumbar laminectomy.  3. Carpal tunnel surgery.  4. Hysterectomy.  5. Acute subdural hematoma which is small (please see below).   ALLERGIES:  POLYMYXIN B, VASOTEC, ZYLOPRIM AND VALACYCLOVIR.   MEDICATIONS:  The MAR is reviewed.  The patient is on Xanax 0.5 mg  b.i.d. which has been necessary for her feeling on edge, Cymbalta 60 mg  daily, Synthroid 112 mcg daily.   LABORATORY DATA:  WBC 8.8, hemoglobin 11, platelet count 357.  Alcohol  on admission was 126, INR 0.9.  Basic metabolic panel within normal  limits.   Head CT without contrast showed a small, left, frontal, subdural  hematoma with a possible small, left, tentorial, subdural hematoma.  Followup CT showed no change.  There was no midline shift.   REVIEW OF SYSTEMS:  CONSTITUTIONAL:  Afebrile.  No weight loss.  HEAD:  The patient has a closed laceration above the left orbit.  EYES:  No  visual changes.  EARS:  No hearing impairment.  NOSE:  No rhinorrhea.  MOUTH/THROAT:  No sore throat. NEUROLOGIC:  No focal motor or sensory  deficits.  PSYCHIATRIC:  As above.  CARDIOVASCULAR:  No chest pain,  palpitations.  RESPIRATORY:  No coughing or wheezing.  GASTROINTESTINAL:  No nausea, vomiting, diarrhea.   GENITOURINARY:  No dysuria.  SKIN:  Unremarkable.  ENDOCRINE/METABOLIC:  No heat or cold intolerance.  MUSCULOSKELETAL:  No deformities.  HEMATOLOGIC/LYMPHATIC:  Mild anemia.   PHYSICAL EXAMINATION:  VITAL SIGNS:  Temperature 97.8, pulse 102,  respiratory rate 19, blood pressure 168/92, O2 saturation on room air  95%.  GENERAL:  Mrs. Connie Fitzpatrick is an elderly female appearing her chronologic age  of 39, sitting up in her hospital bed with no abnormal involuntary  movements.  She is in no apparent distress.  MENTAL STATUS EXAM:  Mrs. Connie Fitzpatrick is alert.  She is oriented to all  spheres.  Her eye contact is good.  Her attention span is within normal  limits.  Her concentration is within normal limits.  Her affect is  mildly anxious.  Her mood is mildly anxious.  She is oriented to all  spheres.  Her memory is intact to immediate recent and remote.  Her fund  of knowledge and intelligence are above average.  Speech involves normal  rate and prosody without dysarthria.  Thought process is logical,  coherent, goal-directed.  No looseness of associations.  Language  expression and comprehension are intact.  Abstraction is intact.  Thought content with no thoughts of harming herself.  No thoughts of  harming others.  No delusions, no hallucinations.  Insight is partial.  Judgment is intact.   ASSESSMENT:  AXIS I:  1.  (293.83)  Mood disorder, not otherwise  specified (functional and general medical factors), depressed.  1. (23.84) Anxiety disorder, not otherwise specified.  2. Alcohol dependence.  3. (296.35) Major depressive disorder, recurrent in partial remission,      provisional.  AXIS II:  Deferred.  AXIS III:  See general medical section above.  AXIS IV:  General medical.  AXIS V:  55.   Mrs. Connie Fitzpatrick is not at risk to harm herself or others.  She agrees to call  emergency services immediately for any thoughts of harming herself,  thoughts of harming others or distress.   RECOMMENDATIONS:   The undersigned did recommend that he patient enter a  chemical dependency inpatient program given her history of less intense  therapy for alcohol and relapse, however, the patient declined inpatient  chemical dependency treatment as well as intensive outpatient CD  treatment.  She states that she also would prefer not to attend AA.   She wants  to see a psychiatrist as an outpatient on routine visits for  antidepression.   The undersigned provided ego supportive psychotherapy and education and  emphasized that if she changed her mind, she could call [phone],  regarding chemical dependency programs.   Regarding her medication treatment, she will continue on Cymbalta 60 mg  daily.   Regarding her outpatient care, in addition to a psychiatrist for  psychotropic medication management, the undersigned recommends a course  of cognitive behavioral therapy with a psychotherapist including deep  breathing and progressive muscle relaxation.  This will help reduce of  any symptoms that would be treated otherwise with benzodiazepines.   The SSRI component of Cymbalta should help her with her anxiety.   I would not prescribe benzodiazepines for this patient until her  sobriety from alcohol is confirmed.  The goal of medication combined  with psychotherapy is the elimination of benzodiazepine treatment.      Antonietta Breach, M.D.  Electronically Signed     JW/MEDQ  D:  03/13/2007  T:  03/14/2007  Job:  [id number]

## 2010-10-17 NOTE — Discharge Summary (Signed)
NAME:  Connie Fitzpatrick, SPIRITO NO.:  [id number]   MEDICAL RECORD NO.:  [id number]          PATIENT TYPE:  IPS   LOCATION:  0501                          FACILITY:  [facility]   PHYSICIAN:  Geoffery Lyons, M.D.      DATE OF BIRTH:  [birth year]   DATE OF ADMISSION:  11/17/2006  DATE OF DISCHARGE:  11/22/2006                               DISCHARGE SUMMARY   CHIEF COMPLAINT:  This was the first admission to [facility] for this 77 year old married white female voluntarily admitted.  History of alcohol dependence.  She fell and was found in the kitchen  floor, had been drinking since January, 4 pints a week of vodka.  Claimed that she drinks to help numb the pain of her arthritis.  Depression, sleeps poor at night.   PAST PSYCHIATRIC HISTORY:  First time at [facility], no previous  treatment.  Being seen by primary care Finian Helvey, has been given  Cymbalta.   ALCOHOL AND DRUG HISTORY:  As already stated persistent and increased  use of alcohol, started this in January, 4 pints of vodka a week.   MEDICAL HISTORY:  Bilateral knee replacement, osteoarthritis.   MEDICATIONS:  Cymbalta 60 mg per day for a year.   PHYSICAL EXAM PERFORMED:  Failed to show any acute findings.   LABORATORY WORKUP:  CBC white blood cells 6.2, hemoglobin 13, mean  corpuscular volume 97.4, sodium 140, potassium 3.7, glucose 102, BUN 30,  creatinine 1.45, eventually BUN came down to 20, and creatinine 1.27,  SGOT 16, SGPT 10, TSH 0.119.   MENTAL STATUS EXAM:  Reveals an alert cooperative female, good eye  contact.  Speech clear, normal rate, tempo and production.  Mood  depressed, somewhat upset.  Affect broad.  Thought processes logical,  coherent and relevant.  No delusions.  No active suicidal or homicidal  ideas, no hallucinations.  Cognition well-preserved.   ADMITTING DIAGNOSES:  AXIS I: Alcohol dependence.  Depressive disorder  not otherwise specified.  AXIS II: No  diagnosis.  AXIS III: Arthritis, bilateral knee replacement.  AXIS IV: Moderate.  AXIS V:  On admission 35, highest  GAF in the last year 70.   COURSE IN THE HOSPITAL:  She was admitted, started in individual and  group psychotherapy.  She was detoxed with Librium.  She was maintained  on the Cymbalta.  Apparently Saturday night before the admission she was  on the floor.  She had blackout, drinking steadily as already stated  started in January, 4 pints a week, the cheapest vodka she felt find.  Claimed that alcohol numbs the  pain of her arthritis.  Pain was worse  during January due to the cold weather, long history of depression, has  been on Cymbalta 60 mg per day.  She was able to look back at how things  got to be this way.  The situation with the husband has been very  conflicted through the years.  Does not think this is going to change,  but she does not see any options for her self.  Worried  about series of  activities they are going to be involved with family, not sure what to  tell them why she was going to be in the hospital, trying not to upset  the future in-laws of her son and they were coming to visit.  Endorsed  that she was trying to quit drinking, endorsed she was going to have to  develop other ways of dealing with the conflict with the husband.  She  was pretty open about things.  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 They have been very involved in church.  Has  spent the last 9 years caring for ailing parents and extended family.  Husband told her recently that he did not think that he could live with  her any longer due to her drinking. She has been drinking since she was  [age].  She continued to be detoxed,  pretty open about the situation,  worked on Pharmacologist and relapse prevention. On June 23 she was in  full contact with reality.  There were no active  suicidal or homicidal  ideas, no hallucinations or delusions.  No withdrawal.  She was going to  pursue further outpatient treatment.  She was committed to abstinence.  Endorsed that she had quit before and she was going to it again.   DISCHARGE DIAGNOSES:  AXIS I: Alcohol dependence.  Depressive disorder  not otherwise specified.  AXIS II: No diagnosis.  AXIS III:  Status post blackout with fall,  rheumatoid arthritis,  bilateral knee replacement, hypothyroidism.  AXIS IV: Moderate.  AXIS V:  Upon discharge 50.   DISCHARGE MEDICATIONS:  Discharged on Synthroid 112 mcg per day, Cartia  XL 220 mg per day.  Zetia 10 mg per day, Cymbalta 60 mg per day, Lasix  40 mg per day p.o., Theophylline slow B 100 mg every 12 hours, Colace  100 mg at night, Pepcid 10 mg twice a day.  K-Dur 10 mEq twice daily,  Toprol XL 50 mg at bedtime, Claritin 10 mg per day.  Cipro 250 twice a  day for five more days.   FOLLOW UP:  Follow up outpatient Ashmi Blas      Geoffery Lyons, M.D.  Electronically Signed     IL/MEDQ  D:  12/10/2006  T:  12/12/2006  Job:  [id number]

## 2010-11-03 ENCOUNTER — Other Ambulatory Visit (INDEPENDENT_AMBULATORY_CARE_PROVIDER_SITE_OTHER): Payer: Medicare Other

## 2010-11-03 ENCOUNTER — Other Ambulatory Visit: Payer: Self-pay | Admitting: Internal Medicine

## 2010-11-03 ENCOUNTER — Encounter: Payer: Self-pay | Admitting: Internal Medicine

## 2010-11-03 ENCOUNTER — Ambulatory Visit (INDEPENDENT_AMBULATORY_CARE_PROVIDER_SITE_OTHER): Payer: Medicare Other | Admitting: Internal Medicine

## 2010-11-03 DIAGNOSIS — M069 Rheumatoid arthritis, unspecified: Secondary | ICD-10-CM

## 2010-11-03 DIAGNOSIS — E785 Hyperlipidemia, unspecified: Secondary | ICD-10-CM

## 2010-11-03 DIAGNOSIS — E039 Hypothyroidism, unspecified: Secondary | ICD-10-CM

## 2010-11-03 DIAGNOSIS — J309 Allergic rhinitis, unspecified: Secondary | ICD-10-CM

## 2010-11-03 LAB — LIPID PANEL: VLDL: 30.4 mg/dL (ref 0.0–40.0)

## 2010-11-03 LAB — CBC WITH DIFFERENTIAL/PLATELET
Basophils Relative: 0.3 % (ref 0.0–3.0)
Eosinophils Relative: 0.8 % (ref 0.0–5.0)
Hemoglobin: 12.8 g/dL (ref 12.0–15.0)
Lymphocytes Relative: 16.2 % (ref 12.0–46.0)
MCV: 97.3 fl (ref 78.0–100.0)
Monocytes Absolute: 0.5 10*3/uL (ref 0.1–1.0)
Neutro Abs: 7.5 10*3/uL (ref 1.4–7.7)
Neutrophils Relative %: 77.8 % — ABNORMAL HIGH (ref 43.0–77.0)
RBC: 4.02 Mil/uL (ref 3.87–5.11)
WBC: 9.7 10*3/uL (ref 4.5–10.5)

## 2010-11-03 MED ORDER — AZELASTINE HCL 0.1 % NA SOLN
2.0000 | Freq: Two times a day (BID) | NASAL | Status: DC
Start: 2010-11-03 — End: 2011-10-26

## 2010-11-03 MED ORDER — POTASSIUM CHLORIDE CRYS ER 10 MEQ PO TBCR: 10.0000 meq | EXTENDED_RELEASE_TABLET | Freq: Every day | ORAL | Status: DC

## 2010-11-03 MED ORDER — THEOPHYLLINE 100 MG PO TB12: 100.0000 mg | ORAL_TABLET | Freq: Every day | ORAL | Status: DC | PRN

## 2010-11-03 MED ORDER — LORATADINE-PSEUDOEPHEDRINE ER 5-120 MG PO TB12: 1.0000 | ORAL_TABLET | Freq: Two times a day (BID) | ORAL | Status: DC

## 2010-11-03 NOTE — Assessment & Plan Note (Signed)
Labs done 07/2010 - no changes, no meds rec

## 2010-11-03 NOTE — Progress Notes (Signed)
Subjective:    Patient ID: Connie Fitzpatrick, female    DOB: 02-13-1928, 77 y.o.   MRN: [id number]  HPI Here for follow up - reviewed chronic medical issues:  Asthma - takes prn AM dose of theophylline oin addition to scheduled qhs dose - no flares but requests refills  allg rhinitis - continued clear runny nasal discharge despite OTC and steroid nasal spray - no congestion, no sneezing, no HA or fever  chronic pain - complicated by severe arthritis (RA, gout) - hands, wrists, shoulders - follows with ortho (graves) and planning to see hand re: L left wrist pain and swelling, hx fx 09/2006 following fall - uses sched norco and takes cymbalta for this and depression symptoms   severe gout - also advanced RA and OA/DJD - on chronic pred for control same + uloric - prev tophi but all resolved with chronic pred use -   depression and anxiety - associated with chronic insomnia. complicated by hx alcohol abuse - chronic sched BZ + cymbalta - reports compliance with ongoing medical treatment and no changes in medication dose or frequency. denies adverse side effects related to current therapy.   hypothyroid - reports compliance with ongoing medical treatment and no changes in medication dose or frequency. denies adverse side effects related to current therapy.  No weight, bowel or skin changes  Past Medical History  Diagnosis Date  . Thyroid disease   . Depression   . Gout   . GERD (gastroesophageal reflux disease)   . ALCOHOL ABUSE 08/04/2010  . DEPRESSION 08/04/2010  . INSOMNIA 08/04/2010  . KNEE REPLACEMENT, BILATERAL, HX OF 08/04/2010  . HYPOTHYROIDISM 08/13/2010  . HYPERLIPIDEMIA 08/11/2010  . HYPERTENSION 08/11/2010  . ASTHMA 08/11/2010  . GERD 08/11/2010  . URINARY INCONTINENCE 08/11/2010  . ARTHRITIS 08/11/2010  . Head injury 08/21/2010  . Neck injury 08/21/2010  . ALLERGIC RHINITIS   . OSTEOARTHRITIS   . Rheumatoid arthritis      Review of Systems  Constitutional: Negative for fever and  unexpected weight change.  Respiratory: Negative for shortness of breath.   Cardiovascular: Negative for chest pain.  Musculoskeletal: Positive for joint swelling and arthralgias.       Objective:   Physical Exam BP 122/68  Pulse 69  Temp(Src) 97.4 F (36.3 C) (Oral)  Resp 16  Wt 124 lb 8 oz (56.473 kg)  SpO2 97% Physical Exam  Constitutional: She is oriented to person, place, and time. She appears well-developed and well-nourished. No distress. spouse at side Neck: Normal range of motion. Neck supple. No JVD present. No thyromegaly present.  Cardiovascular: Normal rate, regular rhythm and normal heart sounds.  No murmur heard. No BLE edema. Pulmonary/Chest: Effort normal and breath sounds normal. No respiratory distress. She has no wheezes. Musculoskeletal: chronic rheumatoid deformities, esp L>R wrist, fingers Neurological: She is alert and oriented to person, place, and time. No cranial nerve deficit. Coordination normal.  Skin: Skin is warm and dry. No rash noted. No erythema.  Psychiatric: She has a normal mood and affect. Her behavior is normal. Judgment and thought content normal.       Lab Results  Component Value Date   WBC 10.80 07/05/2009   HGB 12.3 07/05/2009   HCT 37.7 07/05/2009   PLT 446 07/05/2009   CHOL 235 07/09/2010   HDL 62 07/09/2010   ALT 19 07/09/2010   AST 20 07/09/2010   NA 143 07/09/2010   K 4.0 07/09/2010   CL 105 07/09/2010   CREATININE 0.9  07/09/2010   BUN 24 07/09/2010   CO2 29 07/09/2010   TSH 3.38 07/09/2010    Assessment & Plan:  See problem list. Medications and labs reviewed today. Reviewed ER visit and ROV with TJ 07/2010 with accidental fall resulting in neck/head pain - now resolved injury but chronically "off balance" = order HHPT

## 2010-11-03 NOTE — Assessment & Plan Note (Signed)
On chronic pred, no acute flares ?if able to resume celebrex - will review prior EGD given hx "ulcers" (not in EMR at this time) Order HHPT/Ot to supplement ongoing aide (private)

## 2010-11-03 NOTE — Assessment & Plan Note (Signed)
Check labs -  Pt request name brand refills Lab Results  Component Value Date   TSH 3.38 07/09/2010

## 2010-11-03 NOTE — Assessment & Plan Note (Signed)
Add astelin for non allergic component of rhinitis - erx done Refills on claritin D as requested

## 2010-11-03 NOTE — Patient Instructions (Signed)
It was good to see you today. We have reviewed your prior records including labs and tests today Medications reviewed, see below for changes at this time. Test(s) ordered today. Your results will be called to you after review (48-72hours after test completion). If any changes need to be made, you will be notified at that time. we'll make referral for home health PT/OT. Our office will contact you regarding appointment(s) once made. Please schedule followup in 3-4 months, call sooner if problems.

## 2010-11-04 ENCOUNTER — Telehealth: Payer: Self-pay | Admitting: *Deleted

## 2010-11-04 ENCOUNTER — Encounter: Payer: Self-pay | Admitting: Internal Medicine

## 2010-11-04 MED ORDER — SYNTHROID 100 MCG PO TABS: 100.0000 ug | ORAL_TABLET | Freq: Every day | ORAL | Status: DC

## 2010-11-04 NOTE — Telephone Encounter (Signed)
Notified pt with results concerning labs. Per husband md wanted to know when she had her colon/endo done. 01/01/10 by Dr. Wandalee Ferdinand with [facility]. Colon was normal, Endo dx ulcer. Was told to continue taking all meds except for the celebrex....11/04/10@2 :13pm/LMB

## 2010-11-05 NOTE — Telephone Encounter (Signed)
Ok - noted and thanks - celebrex removed from med list, will avoid other NSAIDs

## 2010-11-11 ENCOUNTER — Other Ambulatory Visit: Payer: Self-pay | Admitting: *Deleted

## 2010-11-11 MED ORDER — TRAMADOL HCL 50 MG PO TABS: 50.0000 mg | ORAL_TABLET | Freq: Four times a day (QID) | ORAL | Status: DC | PRN

## 2010-12-01 DIAGNOSIS — G8929 Other chronic pain: Secondary | ICD-10-CM

## 2010-12-01 DIAGNOSIS — R269 Unspecified abnormalities of gait and mobility: Secondary | ICD-10-CM

## 2010-12-01 DIAGNOSIS — M069 Rheumatoid arthritis, unspecified: Secondary | ICD-10-CM

## 2010-12-01 DIAGNOSIS — IMO0001 Reserved for inherently not codable concepts without codable children: Secondary | ICD-10-CM

## 2010-12-02 ENCOUNTER — Encounter (HOSPITAL_COMMUNITY): Payer: Self-pay | Admitting: Radiology

## 2010-12-02 ENCOUNTER — Emergency Department (HOSPITAL_COMMUNITY): Payer: Medicare Other

## 2010-12-02 ENCOUNTER — Inpatient Hospital Stay (HOSPITAL_COMMUNITY)
Admission: EM | Admit: 2010-12-02 | Discharge: 2010-12-08 | DRG: 392 | Disposition: A | Discharge status: Skilled Nursing Facility | Payer: Medicare Other | Attending: Internal Medicine | Admitting: Internal Medicine

## 2010-12-02 DIAGNOSIS — I10 Essential (primary) hypertension: Secondary | ICD-10-CM | POA: Diagnosis present

## 2010-12-02 DIAGNOSIS — R1032 Left lower quadrant pain: Secondary | ICD-10-CM | POA: Diagnosis present

## 2010-12-02 DIAGNOSIS — E039 Hypothyroidism, unspecified: Secondary | ICD-10-CM | POA: Diagnosis present

## 2010-12-02 DIAGNOSIS — M069 Rheumatoid arthritis, unspecified: Secondary | ICD-10-CM | POA: Diagnosis present

## 2010-12-02 DIAGNOSIS — F0280 Dementia in other diseases classified elsewhere without behavioral disturbance: Secondary | ICD-10-CM | POA: Diagnosis present

## 2010-12-02 DIAGNOSIS — E871 Hypo-osmolality and hyponatremia: Secondary | ICD-10-CM | POA: Diagnosis present

## 2010-12-02 DIAGNOSIS — R1031 Right lower quadrant pain: Secondary | ICD-10-CM | POA: Diagnosis present

## 2010-12-02 DIAGNOSIS — K921 Melena: Secondary | ICD-10-CM | POA: Diagnosis present

## 2010-12-02 DIAGNOSIS — K219 Gastro-esophageal reflux disease without esophagitis: Secondary | ICD-10-CM | POA: Diagnosis present

## 2010-12-02 DIAGNOSIS — K5900 Constipation, unspecified: Secondary | ICD-10-CM

## 2010-12-02 DIAGNOSIS — R109 Unspecified abdominal pain: Secondary | ICD-10-CM

## 2010-12-02 DIAGNOSIS — G309 Alzheimer's disease, unspecified: Secondary | ICD-10-CM | POA: Diagnosis present

## 2010-12-02 DIAGNOSIS — F1020 Alcohol dependence, uncomplicated: Secondary | ICD-10-CM | POA: Diagnosis present

## 2010-12-02 LAB — URINALYSIS, ROUTINE W REFLEX MICROSCOPIC
Bilirubin Urine: NEGATIVE
Glucose, UA: NEGATIVE mg/dL
Nitrite: NEGATIVE
Specific Gravity, Urine: 1.012 (ref 1.005–1.030)
pH: 8 (ref 5.0–8.0)

## 2010-12-02 LAB — CBC
HCT: 42.7 % (ref 36.0–46.0)
MCH: 31.5 pg (ref 26.0–34.0)
MCHC: 33 g/dL (ref 30.0–36.0)
MCV: 95.3 fL (ref 78.0–100.0)
RDW: 13.9 % (ref 11.5–15.5)

## 2010-12-02 LAB — DIFFERENTIAL
Eosinophils Relative: 2 % (ref 0–5)
Lymphocytes Relative: 22 % (ref 12–46)
Lymphs Abs: 1.7 10*3/uL (ref 0.7–4.0)
Monocytes Absolute: 0.8 10*3/uL (ref 0.1–1.0)
Monocytes Relative: 10 % (ref 3–12)

## 2010-12-02 LAB — POCT OCCULT BLOOD STOOL (DEVICE): Fecal Occult Bld: POSITIVE

## 2010-12-02 LAB — COMPREHENSIVE METABOLIC PANEL
AST: 46 U/L — ABNORMAL HIGH (ref 0–37)
BUN: 21 mg/dL (ref 6–23)
CO2: 27 mEq/L (ref 19–32)
Chloride: 94 mEq/L — ABNORMAL LOW (ref 96–112)
Creatinine, Ser: 1.03 mg/dL (ref 0.50–1.10)
GFR calc Af Amer: >60 mL/min (ref >60)
GFR calc non Af Amer: 52 mL/min — ABNORMAL LOW (ref >60)
Glucose, Bld: 100 mg/dL — ABNORMAL HIGH (ref 70–99)
Total Bilirubin: 0.4 mg/dL (ref 0.3–1.2)

## 2010-12-02 LAB — CARDIAC PANEL(CRET KIN+CKTOT+MB+TROPI)
CK, MB: 3.6 ng/mL (ref 0.3–4.0)
Total CK: 84 U/L (ref 7–177)

## 2010-12-02 MED ORDER — IOHEXOL 300 MG/ML  SOLN
100.0000 mL | Freq: Once | INTRAMUSCULAR | Status: AC | PRN
  Administered 2010-12-02: 100 mL via INTRAVENOUS

## 2010-12-02 NOTE — H&P (Signed)
[name], Fitzpatrick NO.:  [id number]  MEDICAL RECORD NO.:  [id number]  LOCATION:  WLED                         FACILITY:  [facility]  PHYSICIAN:  Pleas Koch, MD        DATE OF BIRTH:  08-12-1927  DATE OF ADMISSION:  12/02/2010 DATE OF DISCHARGE:                             HISTORY & PHYSICAL   CHIEF COMPLAINT: 1. Falls. 2. Dark stool. 3. Significant rheumatoid arthritis and pain.  HISTORY OF PRESENT ILLNESS:  This is a very pleasant 77 year old female with multiple medical issues, who reports to me that the reason she came to the ED was that she tripped and fell on floor and needed to get around.  This happened last p.m.  She states that she was dizzy.  She had no dizziness.  She was just moving fast that she normally should have and has fallen before.  She states no chest pain, no double vision, no lightheadedness or any other issues.  Her husband then comes into the room and tells me that the patient complained this morning of severe abdominal pain, it hurt so bad that he brought her to the ED.  He states that she could not really move around. She has not really had a bowel movement, she was too weak to get up to the car and two people had to get her up.  They both are pretty tangential; however, I am able to glean that she has had pretty severe rheumatoid arthritis as well as possible gout and has been on prednisone long-term for a while and is also on Ultram.  The patient is not able to tell me pretty much how bad the pain was but said that the shots in the stomach what helped.  She does endorse that she has significant joint pains, especially in the left joint and has been seen by orthopedic surgery in the past.  Her husband also states that she worked pretty rigorously with physical therapy at her home yesterday and did 4 to 6 rounds around the chair and had to keep moving and then developed some thigh pain in addition to her other issues.  PAST  MEDICAL HISTORY:  Significant for hypothyroidism, history of rheumatoid arthritis and gout, elevated blood pressure.  No lung disease.  No heart disease that she knows of.  She has bad arthritis and seen by Dr. Allena Katz 15 years ago and he did back surgery which healed this.  She also has significant wrist swelling and has been seen by Dr. Luiz Blare but they have held off on doing surgery.  REVIEW OF SYSTEMS:  On further review of systems, both husband and wife are able to endorse that she does not really eat well and has difficulty swallowing in general.  She also currently has abdominal pain 5 over 10 and it is a dull aching pain, which was relieved as said before by shots.  MEDICATIONS:  Medication history is not reconciled but seem to include: 1. Synthroid 100 mcg once daily. 2. Uloric 40 mg once daily. 3. Alprazolam 0.5 twice daily. 4. Diltiazem 180 once daily. 5. Prednisone 10 mg once daily. 6. Colchicine 0.6 mg once daily.  7. Lasix 20 mg once daily. 8. Klor-Con 20 mEq daily. 9. Pantoprazole 40 mg once daily. 10.B12 1000 mcg once daily. 11.Claritin-D 12 as needed twice daily. 12.Vitamin B1 100 mg once daily. 13.Ultram 50 mg as needed. 14.Caltrate plus D 1 tablet once daily. 15.Aricept 10 mg once daily. 16.Coenzyme Q 100 mg once daily. 17.Vicodin 10/325 mg twice daily. 18.Biotene 300 mg.  SOCIAL HISTORY:  The patient also has significant depression history and is known to abuse alcohol as well but has been clean from alcohol per her husband for the past 3 years.  FAMILY HISTORY:  Her mother passed at the age of 8 secondary to a fall. Father passed at the age of 40 with lung emphysema.  Sister had diabetes mellitus.  The patient's husband also reveals to me that the patient has been followed in the past by Dr. Sherre Poot at [facility], who has done endoscope and colonoscopy about a year ago and coincidental ultrasound of the abdomen.  She was told that she would not need a  colonoscopy.  She has significant acid reflux and pain of this, and has not been able to get any relief from the same.  SOCIAL HISTORY:  She is to [employer] and she was a Engineer, civil (consulting) as well.  She used to drink until 3 years ago.  She does not smoke.  ALLERGIES:  She is allergic to ALLOPURINOL, AZITHROMYCIN, VALTREX AND VASOTEC, which cause throat swelling.  GENERAL PHYSICAL EXAMINATION:  VITAL SIGNS:  On admission, blood pressures were 195 to 212 over 90 to 98, pulse was 81, respirations 20. Pain was 10 over 10.  O2 sats were 100% at 6 a.m. GENERAL:  The patient is a frail Caucasian female, for the most part oriented but does seem to be a little bit agitated. HEENT:  Pinpoint pupils.  Throat is clear.  Moderately good dentition. NECK:  No neck bruit.  Neck is soft.  Slight JVD. HEART:  Increase S1 and S2.  No murmurs, rubs or gallops.  CTAB.  No tactile vocal resonance or fremitus. ABDOMEN:  Soft but distended.  Nontender.  No rebound no guarding. Decreased bowel sounds. RECTAL:  Deferred but as per report by the ED provider, it looks like it showed maroonish-colored blood which showed that this was grossly guaiac positive x1.  LABORATORY DATA:  Vitals on admit, urinalysis negative.  Fecal occult blood was positive.  Lipase is 27.  Cardiac panel, CK 84, CK-MB 3.6, troponin less than 0.30.  CMP, sodium 137, potassium 3.7, chloride 94, CO2 27, glucose 100, BUN to creatinine 21 over 1.03.  I do not have another for comparison.  Bilirubin is 0.4.  Alk phos is 86, SGOT 46, SGPT 33, calcium 10.6.  CBC shows hemoglobin of 14.1, hematocrit of 42.7, WBC 7.9, platelet count 277,000, normal differential.  Compared with CBC done on June 4, this is actually better, hemoglobin of 12.8.  PERTINENT IMAGING STUDIES:  CT abdomen and pelvis done December 02, 2010 showed no evidence of bowel obstruction.  Normal appendix.  Oral contrast within distal esophagus raising possibility of  gastroesophageal reflux or esophageal dysmotility, 3.9 cm right adnexal cystic lesion, unchanged from 2008.  IMPRESSION AND ASSESSMENT:  This is an 77 year old pleasant Caucasian female with: 1. Falls, likely secondary to impaired proprioception from rheumatoid     arthritis and significant pain issues secondary to her are     arthritides and on prednisone which may have caused her to develop     gastrointestinal  bleed, which is more likely upper than lower given     history of maroonish stool. 2. Possible gastrointestinal bleed.  I will taper and discontinue her     prednisone for now, as this may be irritating her reflux and maybe     the precipitant for her gastrointestinal issues.  She is on Ultram     which will also need to be discontinued and we will place her on     Dilaudid for abdominal pain at this point in time.  We will also     hold off on colchicine at this point in time and just continue     Uloric.  Further recommendations as I do get a full med rec.     Gastroenterology has been consulted regarding these issues and     hopefully will see her and evaluate her for another colonoscopy.     I will let gastroenterology make that     call-a differential would be Ischemic colitis and we will review her with      another abdominal exam. 3. Severe rheumatoid arthritis.  Options seem limited.  I would     recommend around the discharge period of time that she transitions     to opiates only given the fact that NSAIDs would be a relative     contraindication with her if she does have a bleed. 4. Hypertension.  Her blood pressure shows evidences of accelerated     hypertension but she has not had her medications, so we will     reconcile them and start her back once meds her reconciled. 5. Hypothyroidism.  She will continue on Synthroid. 6. Depression-stable continue meds as reconciled 7. Dementia.  This is mild, however, Aricept has been shown to     contribute to falls in the  elderly and I would recommend     discontinuing off this and placing her maybe on Namenda. 8. History of depression.  The patient is not on any antidepressants     and may likely need to be on the same.  However, SSRIs can also     worsen bleeding. 9. History of ethanol abuse.  The patient does not need CIWA protocol     at this time, as she is clinically clear from the same. 10.History of back surgery, this is stable at this time.  I anticipate     that she will experience some blood loss anemia and we will do labs     daily.  I also anticipate that her BUN, creatinine is slightly     elevated and may become more so over the course of time given     breakdown products of blood.  We will follow the patient daily.     The patient will be admitted on to [facility].  I have discussed fully with her husband, Carlisia Geno, at phone number [phone].          ______________________________ Pleas Koch, MD     JS/MEDQ  D:  12/02/2010  T:  12/02/2010  Job:  [id number]  Electronically Signed by Pleas Koch MD on 12/02/2010 08:04:52 PM

## 2010-12-03 LAB — COMPREHENSIVE METABOLIC PANEL
AST: 50 U/L — ABNORMAL HIGH (ref 0–37)
Albumin: 2.9 g/dL — ABNORMAL LOW (ref 3.5–5.2)
BUN: 17 mg/dL (ref 6–23)
Calcium: 8.9 mg/dL (ref 8.4–10.5)
Chloride: 91 mEq/L — ABNORMAL LOW (ref 96–112)
Creatinine, Ser: 0.84 mg/dL (ref 0.50–1.10)
Total Bilirubin: 0.5 mg/dL (ref 0.3–1.2)
Total Protein: 5.9 g/dL — ABNORMAL LOW (ref 6.0–8.3)

## 2010-12-03 LAB — CBC
HCT: 45.8 % (ref 36.0–46.0)
MCHC: 32.3 g/dL (ref 30.0–36.0)
MCV: 94.2 fL (ref 78.0–100.0)
Platelets: 239 10*3/uL (ref 150–400)
RDW: 13.8 % (ref 11.5–15.5)
WBC: 12.5 10*3/uL — ABNORMAL HIGH (ref 4.0–10.5)

## 2010-12-03 LAB — LACTIC ACID, PLASMA: Lactic Acid, Venous: 1.2 mmol/L (ref 0.5–2.2)

## 2010-12-04 LAB — BASIC METABOLIC PANEL
BUN: 28 mg/dL — ABNORMAL HIGH (ref 6–23)
CO2: 22 mEq/L (ref 19–32)
Chloride: 91 mEq/L — ABNORMAL LOW (ref 96–112)
Creatinine, Ser: 1.09 mg/dL (ref 0.50–1.10)
GFR calc Af Amer: 58 mL/min — ABNORMAL LOW (ref >60)
Glucose, Bld: 98 mg/dL (ref 70–99)
Potassium: 4.8 mEq/L (ref 3.5–5.1)

## 2010-12-04 LAB — CBC
HCT: 41.2 % (ref 36.0–46.0)
Hemoglobin: 13.3 g/dL (ref 12.0–15.0)
MCV: 93 fL (ref 78.0–100.0)
RBC: 4.43 MIL/uL (ref 3.87–5.11)
RDW: 13.8 % (ref 11.5–15.5)
WBC: 13.7 10*3/uL — ABNORMAL HIGH (ref 4.0–10.5)

## 2010-12-04 LAB — LIPID PANEL
Cholesterol: 190 mg/dL (ref 0–200)
Total CHOL/HDL Ratio: 4.1 RATIO
Triglycerides: 179 mg/dL — ABNORMAL HIGH (ref <150)
VLDL: 36 mg/dL (ref 0–40)

## 2010-12-04 LAB — POCT OCCULT BLOOD STOOL (DEVICE): Fecal Occult Bld: NEGATIVE

## 2010-12-05 LAB — CBC
HCT: 35.6 % — ABNORMAL LOW (ref 36.0–46.0)
Hemoglobin: 12 g/dL (ref 12.0–15.0)
MCHC: 33.7 g/dL (ref 30.0–36.0)
MCV: 91.8 fL (ref 78.0–100.0)

## 2010-12-05 LAB — COMPREHENSIVE METABOLIC PANEL
Alkaline Phosphatase: 71 U/L (ref 39–117)
BUN: 20 mg/dL (ref 6–23)
Chloride: 91 mEq/L — ABNORMAL LOW (ref 96–112)
Creatinine, Ser: 0.82 mg/dL (ref 0.50–1.10)
GFR calc Af Amer: >60 mL/min (ref >60)
Glucose, Bld: 113 mg/dL — ABNORMAL HIGH (ref 70–99)
Potassium: 3.9 mEq/L (ref 3.5–5.1)
Total Bilirubin: 0.4 mg/dL (ref 0.3–1.2)
Total Protein: 5.6 g/dL — ABNORMAL LOW (ref 6.0–8.3)

## 2010-12-05 LAB — POCT OCCULT BLOOD STOOL (DEVICE): Fecal Occult Bld: NEGATIVE

## 2010-12-05 LAB — OSMOLALITY: Osmolality: 272 mOsm/kg — ABNORMAL LOW (ref 275–300)

## 2010-12-06 LAB — SODIUM, URINE, RANDOM: Sodium, Ur: 27 mEq/L

## 2010-12-06 LAB — CBC
MCH: 30.7 pg (ref 26.0–34.0)
MCHC: 33.6 g/dL (ref 30.0–36.0)
Platelets: 260 10*3/uL (ref 150–400)
RDW: 13.6 % (ref 11.5–15.5)

## 2010-12-06 LAB — BASIC METABOLIC PANEL
Calcium: 8.5 mg/dL (ref 8.4–10.5)
GFR calc Af Amer: >60 mL/min (ref >60)
GFR calc non Af Amer: >60 mL/min (ref >60)
Sodium: 126 mEq/L — ABNORMAL LOW (ref 135–145)

## 2010-12-06 LAB — CORTISOL: Cortisol, Plasma: 24.7 ug/dL

## 2010-12-07 LAB — BASIC METABOLIC PANEL
Calcium: 8.2 mg/dL — ABNORMAL LOW (ref 8.4–10.5)
GFR calc Af Amer: >60 mL/min (ref >60)
GFR calc non Af Amer: >60 mL/min (ref >60)
Glucose, Bld: 91 mg/dL (ref 70–99)
Sodium: 131 mEq/L — ABNORMAL LOW (ref 135–145)

## 2010-12-08 LAB — BASIC METABOLIC PANEL
BUN: 17 mg/dL (ref 6–23)
CO2: 24 mEq/L (ref 19–32)
Chloride: 102 mEq/L (ref 96–112)
Glucose, Bld: 85 mg/dL (ref 70–99)
Potassium: 4.8 mEq/L (ref 3.5–5.1)
Sodium: 132 mEq/L — ABNORMAL LOW (ref 135–145)

## 2010-12-08 NOTE — Consult Note (Signed)
[name], Connie Fitzpatrick NO.:  [id number]  MEDICAL RECORD NO.:  [id number]  LOCATION:  1502                         FACILITY:  [facility]  PHYSICIAN:  Currie Paris, M.D.DATE OF BIRTH:  [birth year]  DATE OF CONSULTATION:  12/02/2010 DATE OF DISCHARGE:                                CONSULTATION   TIME OF CONSULTATION:  1615  REQUESTING PHYSICIAN:  Willis Modena, MD  REASON FOR CONSULTATION:  Abdominal pain.  HISTORY OF PRESENT ILLNESS:  Connie Fitzpatrick is a 77 year old white female with a history of alcoholism, Alzheimer disease, GERD, rheumatoid arthritis, hypothyroidism, and hypertension who was brought to the emergency department last night by her husband secondary to abdominal pain.  The history is very vague as the husband is no longer here.  The patient is very drowsy from recent pain medicine.  The daughter is present; however, she is unaware of much of the history.  The daughter does state that the patient has not had a bowel movement in several days.  Apparently, the patient and her husband tried giving her an enema last night; however, she had no results.  They did state that they saw some bright red blood after the enema was completed.  The patient denies any nausea or vomiting.  She states that her pain is unlike her chronic back pain.  It is crampy and sharp in nature and is very episodic.  Upon arrival to the emergency department, she had a CT scan of the abdomen and pelvis, which was completely negative except for a stool burden in the rectum, sigmoid, and left colon.  All of her labs were otherwise completely normal as well.  The patient was admitted for pain control and further observation.  We have been asked to evaluate the patient for further input.  REVIEW OF SYSTEMS:  Please see HPI, otherwise review of systems is difficult to obtain secondary to the patient's drowsiness.  FAMILY HISTORY:  Noncontributory.  PAST MEDICAL HISTORY: 1.  Alcoholism. 2. Alzheimer's. 3. Rheumatoid arthritis. 4. Hypothyroidism. 5. Hypertension. 6. Gout. 7. GERD.  PAST SURGICAL HISTORY: 1. Recent vaginal cuff closure with pelvic floor reinforcement for     vaginal prolapse. 2. Hysterectomy. 3. Back surgery. 4. Bilateral total knee replacement.  SOCIAL HISTORY:  The daughter states the patient lives at home with her husband.  She used to be an alcoholic, but quit drinking approximately 5 years ago.  She denies any tobacco, but did smoke many years ago.  ALLERGIES: 1. ALLOPURINOL. 2. AZITHROMYCIN. 3. VALTREX. 4. VASOTEC.  MEDICATIONS AT HOME: 1. Synthroid. 2. Uloric. 3. Alprazolam. 4. Diltiazem. 5. Prednisone. 6. Colchicine. 7. Lasix. 8. Klor-Con. 9. Pantoprazole. 10.B12. 11.Claritin-D 12.Vitamin B1. 13.Ultram 50 mg. 14.Caltrate plus. 15.Aricept. 16.Coenzyme Q. 17.Vicodin. 18.Biotene.  PHYSICAL EXAMINATION:  GENERAL:  [name] is a 77 year old white female who was initially lying in bed comfortably; however, then had a pain episode and was in obvious distress. VITAL SIGNS:  Temperature 99, pulse 88, respirations 20, blood pressure 153/85. HEENT:  Head is normocephalic, atraumatic.  Sclerae noninjected.  Pupils are equal, round, and reactive to light.  Ears and nose without any obvious masses or lesions.  No  rhinorrhea.  Mouth is pink.  Throat shows no exudate. HEART:  Regular rate and rhythm.  Normal S1, S2.  No murmurs, gallops, or rubs are noted.  She does have palpable carotid, radial, and pedal pulses bilaterally. LUNGS:  Clear to auscultation bilaterally with no wheezes, rhonchi, or rales noted. RESPIRATORY:  Effort is nonlabored. ABDOMEN:  Soft and initially nontender, nondistended with active bowel sounds.  No hernias, masses, or peritoneal signs are noted.  The patient then began having an episode of pain and was minimally tender with palpation, but I suspect most of her pain was crampy  intra-abdominal pain.  MUSCULOSKELETAL:  All 4 extremities are relatively symmetrical with no cyanosis, clubbing, or edema; however, the patient does have multiple contractures secondary to her rheumatoid arthritis, specifically in her hands. SKIN:  Warm and dry with no masses, lesions, or rashes. PSYCH:  The patient is arousable, but sleepy.  She does appear to be oriented x3.  LABORATORY DATA AND DIAGNOSTICS:  Urinalysis is negative.  Fecal occult is positive.  Lipase is 27.  Cardiac panel is negative.  Sodium is 137, potassium 3.7, glucose 100, BUN 21, creatinine 1.03.  LFTs are unremarkable except for AST of 46; however, there was some slight hemolysis.  White blood cell count is 7900, hemoglobin 14.1, hematocrit 42.7, platelet count is 277,000 with 65% neutrophil count.  CT scan of the abdomen and pelvis reveals no evidence of bowel obstruction and normal appendix.  There was some oral contrast within the distal esophagus raising the possibility of GERD or esophageal dysmotility.  The only other finding was 3.9 cm right adnexal cystic lesion, which was unchanged since 2008.  IMPRESSION: 1. Abdominal pain of unknown etiology, questionably secondary to     constipation. 2. Constipation. 3. Alzheimer disease. 4. Rheumatoid arthritis with chronic pain.  PLAN:  The patient has been seen by myself as well as Dr. Jamey Ripa.  There is no acute evidence for immediate surgical intervention.  The patient's abdomen is very soft and actually nontender until she has her episodic crampy pain.  When her pain does arise, it seems like her pain is mostly intra-abdominal and with only mild tenderness with palpation.  There are definitely no signs of peritonitis.  It appears that most of her pain may be coming from cramps secondary to constipation.  However, the patient is on steroids and this may mask some further worsening problem. Therefore, in the meantime, we agree with continuing to try to  clean her out with suppositories.  We will have her on call physician tonight follow up on the patient and reevaluate her later.  We will follow her otherwise very closely for any possible surgical intervention that may be needed.     Letha Cape, PA   ______________________________ Currie Paris, M.D.    KEO/MEDQ  D:  12/02/2010  T:  12/03/2010  Job:  [id number]  cc:   Willis Modena, MD Fax: [phone]  Graylin Shiver, M.D. Fax: [phone]  Electronically Signed by Barnetta Chapel PA on 12/05/2010 04:28:10 PM Electronically Signed by Cyndia Bent M.D. on 12/08/2010 09:50:00 AM

## 2010-12-11 NOTE — Discharge Summary (Signed)
Connie Fitzpatrick, Connie Fitzpatrick                  ACCOUNT NO.:  [id number]  MEDICAL RECORD NO.:  [id number]  LOCATION:  1502                         FACILITY:  [facility]  PHYSICIAN:  Connie Canal, MD      DATE OF BIRTH:  [birth year]  DATE OF ADMISSION:  12/02/2010 DATE OF DISCHARGE:  12/08/2010                        DISCHARGE SUMMARY - REFERRING   PRIMARY CARE PHYSICIAN:  Vikki Ports A. Felicity Coyer, MD, with [facility].  CONSULTING PHYSICIANS: 1. Willis Modena, MD. 2. Currie Paris, MD.  DISCHARGE DIAGNOSES: 1. Abdominal pain, no clear etiology, probably related to     constipation. 2. Hyponatremia, multifactorial etiology including medications,     improved. 3. Hypothyroidism. 4. Severe rheumatoid arthritis. 5. Malignant hypertension. 6. Gout. 7. Gastroesophageal reflux disease. 8. Alcoholism. 9. Alzheimer disease.  DISCHARGE MEDICATIONS: 1. Albuterol inhalations 2 puffs every 4 hours as needed. 2. Alprazolam 0.5 mg 3 times daily as needed. 3. Aricept 10 mg daily. 4. Atrovent 1 spray daily as needed. 5. Biotin 300 mg daily. 6. Calcium carbonate 1 tablet daily. 7. Cymbalta 60 mg daily. 8. Cardizem CD 180 mg daily. 9. Colace 200 mg daily. 10.Lasix 20 mg daily. 11.Melatonin 5 mg at bedtime as needed. 12.Toprol XL 100 mg daily. 13.MiraLax 17 g daily as needed. 14.Multivitamins 1 tablet daily. 15.Protonix 40 mg daily. 16.KCl 10 mEq daily while on Lasix. 17.Prednisone acetate 1% suspension both eyes 2 times daily as needed. 18.Prednisone 10 mg daily. 19.Physicians Comfort Formula Joint Support 2 tablets daily. 20.Synthroid 100 mcg daily. 21.Uloric 40 mg daily. 22.Ultram 50 mg every 8 hours as needed. 23.Vicodin 10/325 mg every 8 hours as needed. 24.Vitamin B1 is 100 mg daily. 25.Vitamin B12 is 1000 mcg daily. 26.Vitamin E 400 units daily.  PROCEDURES PERFORMED:  CT abdomen and pelvis on December 02, 2010, showed no evidence of bowel obstruction, normal appendix, and also showed  oral contrast within the distal esophagus raising the possibility of gastroesophageal reflux disease or esophageal dysmotility,and a 3.9 cm right adnexal cystic lesion which is unchanged from 2008.  HOSPITAL COURSE:  [name] was admitted on December 02, 2010, with complaints of dark colored stool, falls, and dizziness.  She also complained of severe abdominal pain, hence was seen by Gastroenterology and General Surgery who could note identify specific cause for the abdominal pain other than possibility of constipation.  The patient did not have any more GI bleeding in the hospital.  She made slow progress. The hospitalization was also marked by episode of hyponatremia possibly related to the patient coming off medications including prednisone. Once the medications were restarted, her sodium gradually improved. Serum uric acid was normal, serum osmolality 272, while urine osmolality was within normal limits.  Cortisol level was 24.7.  She had episode of diarrhea probably from laxatives, and her stool for C diff was negative. The patient may have an element of polypharmacy and prior to discharge, I held some of her medications including colchicine, theophylline, and Claritin which she seemed to do fine without during the hospital stay. It might be worth revisiting her medications to see if further adjustments are necessarily or some of these medications that I held may be needed in the future.  Today, her labs also include a white count of 8, hemoglobin 11.4, hematocrit 33.9, platelet count 260,000.  Sodium 131, potassium 4.8, BUN 17, creatinine 0.74, and calcium 8.6.  Total cholesterol 190, HDL 46, LDL 108, triglycerides 179.  The patient is discharged in relatively stable condition to skilled nursing facility today.  TIME SPENT:  Time spent for discharge preparation less than __________.     Connie Canal, MD     SR/MEDQ  D:  12/08/2010  T:  12/08/2010  Job:  [id number]  cc:    Vikki Ports A. Felicity Coyer, MD [address] [place], [place] [address]  Willis Modena, MD Fax: [phone]  Electronically Signed by Connie Fitzpatrick  on 12/11/2010 07:30:44 PM

## 2011-01-09 NOTE — Consult Note (Signed)
NAMEGERLDINE, Connie Fitzpatrick NO.:  [id number]  MEDICAL RECORD NO.:  [id number]  LOCATION:  1502                         FACILITY:  [facility]  PHYSICIAN:  Willis Modena, MD     DATE OF BIRTH:  1928-03-21  DATE OF CONSULTATION:  12/02/2010 DATE OF DISCHARGE:                                CONSULTATION   REASON FOR CONSULTATION:  Abdominal pain, blood in stool.  CHIEF COMPLAINT:  Abdominal pain.  HISTORY OF PRESENT ILLNESS:  [name] is an 77 year old female with severe rheumatoid arthritis on prednisone and NSAIDs in the past.  She has a history, over 5 years, of mid-back pain.  Over the past 2 weeks, she has developed a distinctly different type of pain in her lower abdomen.  It has become progressively worse and was essentially intractable early this morning, necessitating emergency department evaluation.  She has history of intermittent chronic constipation, has not had a bowel movement about the past week and half.  She denies ever having pain like this associated with constipation in the past.  Her husband has tried some failed attempts at enemas at home in the recent past and has noticed some spots of blood in her stool postdating enema insertion. She had a little bit of bleeding when she arrived in the emergency room, but has not had any for several hours.  She underwent a CT scan of the abdomen and pelvis with contrast that showed diverticulosis changes, consistent with reflux, but otherwise no acute findings, specifically no bowel obstruction, inflammatory changes.  She currently is writhing around in pain, uncomfortable, but is not able to focally verbalize any discrete symptoms otherwise.  Past medical history, past surgical history, home medications, allergies, family history, social history, review of systems, all from dictated note from Dr. Mahala Menghini dated, December 02, 2010.  I have reviewed and I agree.  PHYSICAL EXAMINATION:  VITAL SIGNS:  Blood pressure  is 153/85, heart rate 88, respiratory rate 20, temperature 99.0, oxygen saturation 93% on 2 L. GENERAL:  [name] is uncomfortable-appearing, writhing around in pain. She tells me she is having both back pain and severe abdominal pain, it is hard to tease out which one is the most pressing. LUNGS:  Clear. HEART:  Regular. ABDOMEN:  Soft, but she endorses severe pain with palpation.  Her bowel sounds are normoactive.  She does not have rebound, rigidity, or guarding.  She has no obvious liver or splenic enlargement. EXTREMITIES:  No peripheral cyanosis, clubbing, or edema. MUSCULOSKELETAL:  Diffuse joint deformities from rheumatoid arthritis. NEUROLOGIC:  Diffusely weak, but nonfocal without lateralizing signs. LYMPHATICS:  No palpable axillary, submandibular, or supraclavicular adenopathy.  RADIOLOGIC STUDIES:  CT of abdomen and pelvis with results as mentioned.  LABORATORY STUDIES:  White count 7.9, hemoglobin 14.1, platelets 277. Sodium 137, potassium 3.7, chloride 94, bicarbonate 27, BUN 21, creatinine 1.0. AST 46, other liver tests are normal.  Calcium slightly up at 10.6, lipase normal at 27.  IMPRESSION:  [name] is an 77 year old female presenting with severe abdominal pain and back pain. Differential considerations are extensive. These would include constipation versus radicular discomfort from back disk problem versus mesenteric ischemia (unlikely  given normal labs, lack of leukocytosis, and normal CT scan), other.  She did have an endoscopy and colonoscopy about a year ago by Dr. Evette Cristal which showed a small gastric ulcer as well as extensive left-sided diverticulosis.  I suspect the blood in her stool is more due to irritation from enemas rather than any intrinsic process.  Gastrointestinal bleeding does not seem to be the source of her symptoms in my estimation.  PLAN: 1. Agree with supportive management with judicious analgesia. 2. We will treat constipation a bit  more aggressively than she was at     home and see if this helps discomfort. 3. Given her severe discomfort and lack of clear explanation, I would     not pursue endoscopy or colonoscopy at this time unless she has     overt ongoing bleeding, which she does not at this time.  We will     check a lactic acid level. 4. The patient is absolutely uncomfortable with significant severe     abdominal pain despite a fairly benign abdominal exam, negative     labs, and negative CT.  I will solicit the opinion of General     Surgery to see if they think there is any possibility of mesenteric     ischemia, but I doubt there is. 5. If all the above measures are unrevealing, one might consider     thoracolumbar spinal MRI or possible orthopedics consultation. 6. I have discussed the case directly with Barnetta Chapel of the     surgical team as well as Dr. Mahala Menghini, the admitting hospitalist, of     record.  Thank you again for allowing me to participate in [name]'s care.     Willis Modena, MD     WO/MEDQ  D:  12/02/2010  T:  12/03/2010  Job:  [id number]  Electronically Signed by Willis Modena  on 01/09/2011 07:22:48 PM

## 2011-01-12 ENCOUNTER — Ambulatory Visit (INDEPENDENT_AMBULATORY_CARE_PROVIDER_SITE_OTHER): Payer: Medicare Other | Admitting: Internal Medicine

## 2011-01-12 ENCOUNTER — Encounter: Payer: Self-pay | Admitting: Internal Medicine

## 2011-01-12 DIAGNOSIS — E039 Hypothyroidism, unspecified: Secondary | ICD-10-CM

## 2011-01-12 DIAGNOSIS — M069 Rheumatoid arthritis, unspecified: Secondary | ICD-10-CM

## 2011-01-12 DIAGNOSIS — I10 Essential (primary) hypertension: Secondary | ICD-10-CM

## 2011-01-12 MED ORDER — PREDNISONE 10 MG PO TABS: 10.0000 mg | ORAL_TABLET | Freq: Every day | ORAL | Status: DC

## 2011-01-12 MED ORDER — FUROSEMIDE 40 MG PO TABS: 20.0000 mg | ORAL_TABLET | Freq: Every day | ORAL | Status: DC

## 2011-01-12 MED ORDER — METOPROLOL SUCCINATE ER 100 MG PO TB24: 100.0000 mg | ORAL_TABLET | Freq: Every day | ORAL | Status: DC

## 2011-01-12 MED ORDER — COLCHICINE 0.6 MG PO TABS: 0.6000 mg | ORAL_TABLET | Freq: Every day | ORAL | Status: DC

## 2011-01-12 MED ORDER — FEBUXOSTAT 40 MG PO TABS: 80.0000 mg | ORAL_TABLET | ORAL | Status: DC

## 2011-01-12 MED ORDER — ALPRAZOLAM 0.5 MG PO TABS: 0.5000 mg | ORAL_TABLET | Freq: Three times a day (TID) | ORAL | Status: DC

## 2011-01-12 MED ORDER — POTASSIUM CHLORIDE CRYS ER 10 MEQ PO TBCR: 10.0000 meq | EXTENDED_RELEASE_TABLET | Freq: Every day | ORAL | Status: DC

## 2011-01-12 MED ORDER — THEOPHYLLINE 200 MG PO TB12: 200.0000 mg | ORAL_TABLET | Freq: Every day | ORAL | Status: DC

## 2011-01-12 MED ORDER — GABAPENTIN 300 MG PO CAPS: 300.0000 mg | ORAL_CAPSULE | Freq: Two times a day (BID) | ORAL | Status: DC

## 2011-01-12 MED ORDER — PANTOPRAZOLE SODIUM 40 MG PO TBEC: 40.0000 mg | DELAYED_RELEASE_TABLET | Freq: Every day | ORAL | Status: DC

## 2011-01-12 MED ORDER — DILTIAZEM HCL ER COATED BEADS 180 MG PO CP24: 180.0000 mg | ORAL_CAPSULE | Freq: Every day | ORAL | Status: DC

## 2011-01-12 MED ORDER — DULOXETINE HCL 60 MG PO CPEP: 60.0000 mg | ORAL_CAPSULE | Freq: Every day | ORAL | Status: DC

## 2011-01-12 NOTE — Patient Instructions (Signed)
It was good to see you today. We have reviewed your prior records including labs and tests today Medications reviewed, no changes at this time. Refill on medication(s) as discussed today. Please schedule followup in 3-4 months, call sooner if problems. If you would like a referral to a rheumatology specialist, let us know! I would not sign up for surgery until this evaluation is complete

## 2011-01-12 NOTE — Progress Notes (Signed)
Subjective:    Patient ID: Connie Fitzpatrick, female    DOB: January 08, 1928, 77 y.o.   MRN: [id number]  HPI  Here for hospital follow up - 7/3-7/9, 2012, then SNF: DISCHARGE DIAGNOSES:  1. Abdominal pain, no clear etiology, probably related to constipation.  2. Hyponatremia, multifactorial etiology including medications, improved.  3. Hypothyroidism.  4. Severe rheumatoid arthritis.  5. Malignant hypertension.  6. Gout.  7. Gastroesophageal reflux disease.  8. Alcoholism.  9. Alzheimer disease. Since home,  Also reviewed chronic medical issues:  Asthma - takes prn AM dose of theophylline in addition to scheduled qhs dose - no flares   allg rhinitis - continued clear runny nasal discharge despite OTC and steroid nasal spray - no congestion, no sneezing, no HA or fever  chronic pain - complicated by severe arthritis (RA, gout) - hands, wrists, shoulders - follows with ortho ([facility]) and planning to see hand re: L left wrist pain and swelling, hx fx 09/2006 following fall - uses sched norco and takes cymbalta for this and depression symptoms   severe gout - also advanced RA and OA/DJD - on chronic pred for control same + uloric - prev tophi but now resolved with chronic pred use -   depression and anxiety - associated with chronic insomnia. complicated by hx alcohol abuse - chronic sched BZ + cymbalta - reports compliance with ongoing medical treatment and no changes in medication dose or frequency. denies adverse side effects related to current therapy.   hypothyroid - reports compliance with ongoing medical treatment and no changes in medication dose or frequency. denies adverse side effects related to current therapy.  No weight, bowel or skin changes  Past Medical History  Diagnosis Date  . Gout   . INSOMNIA   . ALLERGIC RHINITIS   . ASTHMA   . OSTEOARTHRITIS   . Rheumatoid arthritis   . HYPERTENSION   . HYPERLIPIDEMIA   . GERD (gastroesophageal reflux disease)   . Depression   .  ALCOHOL ABUSE     hx detox, dry since 2010  . HYPOTHYROIDISM   . URINARY INCONTINENCE      Review of Systems  Constitutional: Negative for fever and unexpected weight change.  Respiratory: Negative for shortness of breath.   Cardiovascular: Negative for chest pain.  Musculoskeletal: Positive for joint swelling and arthralgias.       Objective:   Physical Exam  BP 122/60  Pulse 75  Temp(Src) 97.6 F (36.4 C) (Oral)  Ht 5' (1.524 m)  Wt 114 lb 12.8 oz (52.073 kg)  BMI 22.42 kg/m2  SpO2 95%   Wt Readings from Last 3 Encounters:  01/12/11 114 lb 12.8 oz (52.073 kg)  11/03/10 124 lb 8 oz (56.473 kg)  08/21/10 123 lb 8 oz (56.019 kg)   Constitutional: She is chronically ill appearing; She appears well-developed and well-nourished. No distress. spouse at side Neck: Normal range of motion. Neck supple. No JVD present. No thyromegaly present.  Cardiovascular: Normal rate, regular rhythm and normal heart sounds.  No murmur heard. No BLE edema. Pulmonary/Chest: Effort normal and breath sounds normal. No respiratory distress. She has no wheezes. Musculoskeletal: chronic rheumatoid deformities, esp L>R wrist pannus, fingers with MCP deformities Neurological: She is alert and oriented to person, place, and time. No cranial nerve deficit. Coordination normal.  Skin: Skin is warm and dry. No rash noted. No erythema.  Psychiatric: She has a normal mood and affect. Her behavior is normal. Judgment and thought content normal.  Lab Results  Component Value Date   WBC 8.0 12/06/2010   HGB 11.4* 12/06/2010   HCT 33.9* 12/06/2010   PLT 260 12/06/2010   CHOL 190 12/04/2010   TRIG 179* 12/04/2010   HDL 46 12/04/2010   LDLDIRECT 130.5 11/03/2010   ALT 20 12/05/2010   AST 20 12/05/2010   NA 132* 12/08/2010   K 4.8 12/08/2010   CL 102 12/08/2010   CREATININE 0.74 12/08/2010   BUN 17 12/08/2010   CO2 24 12/08/2010   TSH 0.62 11/03/2010    Assessment & Plan:  See problem list. Medications and labs reviewed  today.  Time spent with pt/family today 28 minutes, greater than 50% time spent counseling patient on recent hospitalization, SNF rehab and medication review. Also review of prior records

## 2011-01-14 NOTE — Assessment & Plan Note (Signed)
On chronic pred, no acute flares continue HHPT/Ot to supplement ongoing aide (private) Also encouraged consideration of rheum eval and tx - pt will let us know if this is agreeable

## 2011-01-14 NOTE — Assessment & Plan Note (Signed)
BP Readings from Last 3 Encounters:  01/12/11 122/60  11/03/10 122/68  08/21/10 142/70   The current medical regimen is effective;  continue present plan and medications.

## 2011-01-14 NOTE — Assessment & Plan Note (Signed)
The current medical regimen is effective;  continue present plan and medications.  Pt request name brand refills Lab Results  Component Value Date   TSH 0.62 11/03/2010

## 2011-02-04 ENCOUNTER — Other Ambulatory Visit: Payer: Self-pay | Admitting: Internal Medicine

## 2011-02-04 ENCOUNTER — Telehealth: Payer: Self-pay

## 2011-02-04 DIAGNOSIS — S81809A Unspecified open wound, unspecified lower leg, initial encounter: Secondary | ICD-10-CM

## 2011-02-04 NOTE — Telephone Encounter (Signed)
Pt advised via home VM, to expect contact from [facility] RN and to make an appt if necessary.

## 2011-02-04 NOTE — Telephone Encounter (Signed)
Will order [facility] RN eval and tx, pt may make OV if feels symptoms are getting worse - thx

## 2011-02-04 NOTE — Telephone Encounter (Signed)
Pt's spouse called stating pt has 6-8 un healed wounds on her LE that are weeping. Spouse is requesting advisement from MD, does pt need OV THEN wound care OV? Please advise

## 2011-02-06 ENCOUNTER — Encounter: Payer: Self-pay | Admitting: Internal Medicine

## 2011-02-06 ENCOUNTER — Ambulatory Visit (INDEPENDENT_AMBULATORY_CARE_PROVIDER_SITE_OTHER): Payer: Medicare Other | Admitting: Internal Medicine

## 2011-02-06 DIAGNOSIS — L02419 Cutaneous abscess of limb, unspecified: Secondary | ICD-10-CM

## 2011-02-06 DIAGNOSIS — S81809A Unspecified open wound, unspecified lower leg, initial encounter: Secondary | ICD-10-CM

## 2011-02-06 DIAGNOSIS — L03119 Cellulitis of unspecified part of limb: Secondary | ICD-10-CM

## 2011-02-06 MED ORDER — DOXYCYCLINE HYCLATE 100 MG PO TABS: 100.0000 mg | ORAL_TABLET | Freq: Two times a day (BID) | ORAL | Status: AC

## 2011-02-06 MED ORDER — FEBUXOSTAT 40 MG PO TABS: 40.0000 mg | ORAL_TABLET | ORAL | Status: DC

## 2011-02-06 NOTE — Progress Notes (Signed)
Subjective:    Patient ID: Connie Fitzpatrick, female    DOB: May 27, 1928, 77 y.o.   MRN: [id number]  HPI  complains of open leg wounds L>R distal legs -  Onset > 1 week ago  associated with redness and swelling but no fever [facility] - [facility] Covering with neosprorin and bandaid Declines HHRN due to poor experience with same in past   Also reviewed chronic medical issues:  Asthma - takes prn AM dose of theophylline in addition to scheduled qhs dose - no flares   allg rhinitis - continued clear runny nasal discharge despite OTC and steroid nasal spray - no congestion, no sneezing, no HA or fever  chronic pain - complicated by severe arthritis (RA, gout) - hands, wrists, shoulders - follows with ortho ([facility]) and planning to see hand re: L left wrist pain and swelling, hx fx 09/2006 following fall - uses sched norco and takes cymbalta for this and depression symptoms   severe gout - also advanced RA and OA/DJD - on chronic pred for control same + uloric - prev tophi but now resolved with chronic pred use -   depression and anxiety - associated with chronic insomnia. complicated by hx alcohol abuse - chronic sched BZ + cymbalta - reports compliance with ongoing medical treatment and no changes in medication dose or frequency. denies adverse side effects related to current therapy.   hypothyroid - reports compliance with ongoing medical treatment and no changes in medication dose or frequency. denies adverse side effects related to current therapy.  No weight, bowel or skin changes  Past Medical History  Diagnosis Date  . Gout   . INSOMNIA   . ALLERGIC RHINITIS   . ASTHMA   . OSTEOARTHRITIS   . Rheumatoid arthritis   . HYPERTENSION   . HYPERLIPIDEMIA   . GERD (gastroesophageal reflux disease)   . Depression   . ALCOHOL ABUSE     hx detox, dry since 2010  . HYPOTHYROIDISM   . URINARY INCONTINENCE      Review of Systems  Constitutional: Negative for fever and  unexpected weight change.  Respiratory: Negative for shortness of breath.   Cardiovascular: Negative for chest pain.  Musculoskeletal: Positive for joint swelling and arthralgias.       Objective:   Physical Exam  BP 100/52  Pulse 76  Temp(Src) 98.4 F (36.9 C) (Oral)  Ht 5' (1.524 m)  SpO2 95%   Wt Readings from Last 3 Encounters:  01/12/11 114 lb 12.8 oz (52.073 kg)  11/03/10 124 lb 8 oz (56.473 kg)  08/21/10 123 lb 8 oz (56.019 kg)   Constitutional: She is chronically ill appearing; She appears well-developed and well-nourished. No distress. spouse at side Neck: Normal range of motion. Neck supple. No JVD present. No thyromegaly present.  Cardiovascular: Normal rate, regular rhythm and normal heart sounds.  No murmur heard. No BLE edema. Pulmonary/Chest: Effort normal and breath sounds normal. No respiratory distress. She has no wheezes. Musculoskeletal: chronic rheumatoid deformities, esp L>R wrist pannus, fingers with MCP deformities  Skin: bright erythema distal LE L>R, open wounds, superficial with clear weeping distal L>R ( L with 3, largest 2.5cm x 2cm and R with 2 smaller superficial ulcersations) - no abnormal warmth Psychiatric: She has a normal mood and affect. Her behavior is normal. Judgment and thought content normal.       Lab Results  Component Value Date   WBC 8.0 12/06/2010   HGB 11.4* 12/06/2010  HCT 33.9* 12/06/2010   PLT 260 12/06/2010   CHOL 190 12/04/2010   TRIG 179* 12/04/2010   HDL 46 12/04/2010   LDLDIRECT 130.5 11/03/2010   ALT 20 12/05/2010   AST 20 12/05/2010   NA 132* 12/08/2010   K 4.8 12/08/2010   CL 102 12/08/2010   CREATININE 0.74 12/08/2010   BUN 17 12/08/2010   CO2 24 12/08/2010   TSH 0.62 11/03/2010    Assessment & Plan:  See problem list. Medications and labs reviewed today. BLE wounds, open weeping in setting of chronic lymphedema -refer to wound care center at [facility]  Cellulitis BLE related to above - on kelfex, add doxy continue topical wound  care until seen by wound care center - refer as above

## 2011-02-06 NOTE — Patient Instructions (Signed)
It was good to see you today. we'll make referral to wound care center at [facility]. Our office will contact you regarding appointment(s) once made. Add doxy antibiotics to keflex - continue wound care covering as ongoing Your prescription(s) have been submitted to your pharmacy. Please take as directed and contact our office if you believe you are having problem(s) with the medication(s).

## 2011-02-14 ENCOUNTER — Other Ambulatory Visit: Payer: Self-pay | Admitting: Internal Medicine

## 2011-02-19 ENCOUNTER — Encounter (HOSPITAL_BASED_OUTPATIENT_CLINIC_OR_DEPARTMENT_OTHER): Payer: Medicare Other | Attending: Internal Medicine

## 2011-02-19 DIAGNOSIS — M069 Rheumatoid arthritis, unspecified: Secondary | ICD-10-CM | POA: Insufficient documentation

## 2011-02-19 DIAGNOSIS — G8929 Other chronic pain: Secondary | ICD-10-CM | POA: Insufficient documentation

## 2011-02-19 DIAGNOSIS — E039 Hypothyroidism, unspecified: Secondary | ICD-10-CM | POA: Insufficient documentation

## 2011-02-19 DIAGNOSIS — I872 Venous insufficiency (chronic) (peripheral): Secondary | ICD-10-CM | POA: Insufficient documentation

## 2011-02-19 DIAGNOSIS — L97809 Non-pressure chronic ulcer of other part of unspecified lower leg with unspecified severity: Secondary | ICD-10-CM | POA: Insufficient documentation

## 2011-02-19 DIAGNOSIS — K219 Gastro-esophageal reflux disease without esophagitis: Secondary | ICD-10-CM | POA: Insufficient documentation

## 2011-02-19 DIAGNOSIS — I10 Essential (primary) hypertension: Secondary | ICD-10-CM | POA: Insufficient documentation

## 2011-02-19 DIAGNOSIS — M109 Gout, unspecified: Secondary | ICD-10-CM | POA: Insufficient documentation

## 2011-02-19 DIAGNOSIS — E785 Hyperlipidemia, unspecified: Secondary | ICD-10-CM | POA: Insufficient documentation

## 2011-02-19 DIAGNOSIS — M129 Arthropathy, unspecified: Secondary | ICD-10-CM | POA: Insufficient documentation

## 2011-02-19 NOTE — Progress Notes (Unsigned)
[facility]  NAME:  Connie Fitzpatrick, BERTE NO.:  [id number]  MEDICAL RECORD NO.:  [id number]      DATE OF BIRTH:  Apr 02, 1928  PHYSICIAN:  Maxwell Caul, M.D.      VISIT DATE:                                  OFFICE VISIT   HISTORY:  This is an 77 year old woman who is here for review of wounds on her right greater than left leg.  The history I obtained was slightly different from her referral notes from her primary care at [facility] (Dr. Felicity Coyer).  The patient states she has had an open area on this right leg for several months, however, she fell a week or 2 ago and the area was larger and opened.  She has apparently had a bad experience working with physical therapy provider in the past, therefore wanted to be referred here.  Her setting is complicated by rheumatoid arthritis and apparently also severe gout.  Her hands especially on the left are deformed compatible with rheumatoid arthritis, chronic changes.  She has not had previous wounds.  She was apparently put on Keflex at an Urgent Care and then doxycycline at [facility] on September 7.  PAST MEDICAL HISTORY: 1. Asthma. 2. Allergic rhinitis. 3. Chronic pain complicated by severe RA and also gout as well as     osteoarthritis.  She is on chronic prednisone at 10 mg. 4. Depression with anxiety. 5. Hypothyroidism. 6. Gastroesophageal reflux disease. 7. Hypertension. 8. Hyperlipidemia.  PHYSICAL EXAMINATION:  GENERAL:  The patient was not in any distress. She was accompanied by her husband. RESPIRATORY:  Showed shallow but otherwise clear air entry bilaterally. CARDIAC:  Heart sounds were normal.  JVP was not elevated.  There were no murmurs. MUSCULOSKELETAL:  Chronic deformity changes, especially in her left greater than right wrist, also left greater than right MCPs and PIPs.  Wound exam, the major area of concern here is on the right leg.  This is a superficial wound measuring 4.5 x  3.7 x 0.1.  There is probably chronic stasis, chronic venous insufficiency and edema related to this as well as prednisone-induced skin changes.  She has 2 small areas on the left leg which are much less impressive than the right.  Her peripheral pulses are intact.  ABIs measured in this clinic was 1.13 on the right and 1.11 on the left.  IMPRESSION:  Traumatic wound on the right greater than left leg, apparently complicated by recent trauma.  There is no evidence today of infection.  She has completed both Keflex and doxycycline.  The area was not recultured.  We applied a foam-based dressing to all of the wounds using a Kerlix and Coban wrap.  We suggested leaving this in place for a week.  We will see this again in a week's time.          ______________________________ Maxwell Caul, M.D.     MGR/MEDQ  D:  02/19/2011  T:  02/19/2011  Job:  [id number]

## 2011-02-25 ENCOUNTER — Encounter: Payer: Self-pay | Admitting: Internal Medicine

## 2011-02-25 ENCOUNTER — Ambulatory Visit (INDEPENDENT_AMBULATORY_CARE_PROVIDER_SITE_OTHER): Payer: Medicare Other | Admitting: Internal Medicine

## 2011-02-25 DIAGNOSIS — I10 Essential (primary) hypertension: Secondary | ICD-10-CM

## 2011-02-25 DIAGNOSIS — M069 Rheumatoid arthritis, unspecified: Secondary | ICD-10-CM

## 2011-02-25 DIAGNOSIS — M109 Gout, unspecified: Secondary | ICD-10-CM

## 2011-02-25 MED ORDER — TRAMADOL HCL 50 MG PO TABS: 50.0000 mg | ORAL_TABLET | Freq: Four times a day (QID) | ORAL | Status: DC | PRN

## 2011-02-25 NOTE — Assessment & Plan Note (Signed)
BP Readings from Last 3 Encounters:  02/25/11 146/78  02/06/11 100/52  01/12/11 122/60   The current medical regimen is effective;  continue present plan and medications.

## 2011-02-25 NOTE — Progress Notes (Signed)
Subjective:    Patient ID: Connie Fitzpatrick, female    DOB: 02-05-1928, 77 y.o.   MRN: [id number]  HPI  Here for follow up - reviewed chronic medical issues:  Leg wounds, BLE edema - seen by wound clinic last week - tx silver and wrapping - for re-eval and removal of wrapping tomorrow  Asthma - takes prn AM dose of theophylline in addition to scheduled qhs dose - no flares   allg rhinitis - continued clear runny nasal discharge despite OTC and steroid nasal spray - no congestion, no sneezing, no HA or fever  chronic pain - complicated by severe arthritis (RA, gout) - hands, wrists, shoulders - follows with ortho ([facility]) and planning to see hand re: L left wrist pain and swelling, hx fx 09/2006 following fall - uses sched norco and takes cymbalta for this and depression symptoms   severe gout - also advanced RA and OA/DJD - on chronic pred for control same + uloric - prev tophi but now resolved with chronic pred use -   depression and anxiety - associated with chronic insomnia. complicated by hx alcohol abuse - chronic sched BZ + cymbalta - reports compliance with ongoing medical treatment and no changes in medication dose or frequency. denies adverse side effects related to current therapy.   hypothyroid - reports compliance with ongoing medical treatment and no changes in medication dose or frequency. denies adverse side effects related to current therapy.  No weight, bowel or skin changes  Past Medical History  Diagnosis Date  . Gout   . INSOMNIA   . ALLERGIC RHINITIS   . ASTHMA   . OSTEOARTHRITIS   . Rheumatoid arthritis   . HYPERTENSION   . HYPERLIPIDEMIA   . GERD (gastroesophageal reflux disease)   . Depression   . ALCOHOL ABUSE     hx detox, dry since 2010  . HYPOTHYROIDISM   . URINARY INCONTINENCE      Review of Systems  Constitutional: Negative for fever and unexpected weight change.  Respiratory: Negative for shortness of breath.   Cardiovascular: Negative for chest  pain.  Musculoskeletal: Positive for joint swelling and arthralgias.       Objective:   Physical Exam  BP 146/78  Pulse 112  Temp(Src) 98 F (36.7 C) (Oral)  Ht 5' (1.524 m)  Wt 115 lb 6.4 oz (52.345 kg)  BMI 22.54 kg/m2  SpO2 96%   Wt Readings from Last 3 Encounters:  02/25/11 115 lb 6.4 oz (52.345 kg)  01/12/11 114 lb 12.8 oz (52.073 kg)  11/03/10 124 lb 8 oz (56.473 kg)   Constitutional: She is chronically ill appearing; She appears well-developed and well-nourished. No distress. spouse at side Neck: Normal range of motion. Neck supple. No JVD present. No thyromegaly present.  Cardiovascular: Normal rate, regular rhythm and normal heart sounds.  No murmur heard. No BLE edema. Pulmonary/Chest: Effort normal and breath sounds normal. No respiratory distress. She has no wheezes. Musculoskeletal: chronic rheumatoid deformities, esp L>R wrist pannus, fingers with MCP deformities Neurological: She is alert and oriented to person, place, and time. No cranial nerve deficit. Coordination normal.  Skin: Skin is warm and dry. No rash noted. No erythema. legs not examined today- wrapping/dressing c/d/i Psychiatric: She has a normal mood and affect. Her behavior is normal. Judgment and thought content normal.      Lab Results  Component Value Date   WBC 8.0 12/06/2010   HGB 11.4* 12/06/2010   HCT 33.9* 12/06/2010   PLT 260  12/06/2010   CHOL 190 12/04/2010   TRIG 179* 12/04/2010   HDL 46 12/04/2010   LDLDIRECT 130.5 11/03/2010   ALT 20 12/05/2010   AST 20 12/05/2010   NA 132* 12/08/2010   K 4.8 12/08/2010   CL 102 12/08/2010   CREATININE 0.74 12/08/2010   BUN 17 12/08/2010   CO2 24 12/08/2010   TSH 0.62 11/03/2010    Assessment & Plan:  See problem list. Medications and labs reviewed today.

## 2011-02-25 NOTE — Assessment & Plan Note (Signed)
Chronic severe symptoms - continue uloric and pred As with Ra, encouared rheum eval to help with same Increase monthly supply of tramadol for pain to offset hydrocodne use

## 2011-02-25 NOTE — Assessment & Plan Note (Signed)
On chronic pred, no acute flares continue HHPT/Ot to supplement ongoing aide (private) Also encouraged consideration of rheum eval and tx - pt will let us know if this is agreeable 

## 2011-02-25 NOTE — Patient Instructions (Addendum)
It was good to see you today. Increase monthly supply of tramadol for pain - Your prescription(s) have been submitted to your pharmacy. Please take as directed and contact our office if you believe you are having problem(s) with the medication(s). We have reviewed your prior records including labs and tests today Good luck with wound care center tomorrow! Please schedule followup in 4 months, call sooner if problems.

## 2011-03-04 ENCOUNTER — Ambulatory Visit: Payer: Medicare Other | Admitting: Internal Medicine

## 2011-03-05 ENCOUNTER — Encounter (HOSPITAL_BASED_OUTPATIENT_CLINIC_OR_DEPARTMENT_OTHER): Payer: Medicare Other | Attending: Internal Medicine

## 2011-03-05 DIAGNOSIS — M129 Arthropathy, unspecified: Secondary | ICD-10-CM | POA: Insufficient documentation

## 2011-03-05 DIAGNOSIS — E785 Hyperlipidemia, unspecified: Secondary | ICD-10-CM | POA: Insufficient documentation

## 2011-03-05 DIAGNOSIS — G8929 Other chronic pain: Secondary | ICD-10-CM | POA: Insufficient documentation

## 2011-03-05 DIAGNOSIS — E039 Hypothyroidism, unspecified: Secondary | ICD-10-CM | POA: Insufficient documentation

## 2011-03-05 DIAGNOSIS — L97809 Non-pressure chronic ulcer of other part of unspecified lower leg with unspecified severity: Secondary | ICD-10-CM | POA: Insufficient documentation

## 2011-03-05 DIAGNOSIS — I10 Essential (primary) hypertension: Secondary | ICD-10-CM | POA: Insufficient documentation

## 2011-03-05 DIAGNOSIS — M109 Gout, unspecified: Secondary | ICD-10-CM | POA: Insufficient documentation

## 2011-03-05 DIAGNOSIS — K219 Gastro-esophageal reflux disease without esophagitis: Secondary | ICD-10-CM | POA: Insufficient documentation

## 2011-03-05 DIAGNOSIS — M069 Rheumatoid arthritis, unspecified: Secondary | ICD-10-CM | POA: Insufficient documentation

## 2011-03-05 DIAGNOSIS — I872 Venous insufficiency (chronic) (peripheral): Secondary | ICD-10-CM | POA: Insufficient documentation

## 2011-03-06 LAB — DIFFERENTIAL
Basophils Absolute: 0.1
Basophils Relative: 1
Eosinophils Absolute: 0.1
Eosinophils Relative: 1
Lymphocytes Relative: 8 — ABNORMAL LOW
Lymphs Abs: 0.9
Monocytes Absolute: 1.2 — ABNORMAL HIGH
Monocytes Relative: 11
Neutro Abs: 8.8 — ABNORMAL HIGH
Neutrophils Relative %: 80 — ABNORMAL HIGH

## 2011-03-06 LAB — URINALYSIS, ROUTINE W REFLEX MICROSCOPIC
Bilirubin Urine: NEGATIVE
Glucose, UA: NEGATIVE
Hgb urine dipstick: NEGATIVE
Ketones, ur: 15 — AB
Nitrite: NEGATIVE
Protein, ur: NEGATIVE
Specific Gravity, Urine: 1.025
Urobilinogen, UA: 1
pH: 6

## 2011-03-06 LAB — COMPREHENSIVE METABOLIC PANEL WITH GFR
AST: 16
Alkaline Phosphatase: 82
CO2: 28
Chloride: 99
Creatinine, Ser: 1.05
GFR calc Af Amer: >60
GFR calc non Af Amer: 51 — ABNORMAL LOW
Total Bilirubin: 1

## 2011-03-06 LAB — COMPREHENSIVE METABOLIC PANEL
ALT: 8
Albumin: 2.8 — ABNORMAL LOW
BUN: 21
Calcium: 10
Glucose, Bld: 117 — ABNORMAL HIGH
Potassium: 4
Sodium: 138
Total Protein: 6.8

## 2011-03-06 LAB — LIPASE, BLOOD: Lipase: 11

## 2011-03-06 LAB — CBC
HCT: 32.4 — ABNORMAL LOW
Hemoglobin: 11 — ABNORMAL LOW
MCHC: 33.8
MCV: 92.1
Platelets: 416 — ABNORMAL HIGH
RBC: 3.52 — ABNORMAL LOW
RDW: 16.2 — ABNORMAL HIGH
WBC: 11.1 — ABNORMAL HIGH

## 2011-03-06 LAB — URINE CULTURE
Colony Count: NO GROWTH
Culture: NO GROWTH

## 2011-03-06 LAB — OCCULT BLOOD X 1 CARD TO LAB, STOOL: Fecal Occult Bld: NEGATIVE

## 2011-03-06 LAB — ETHANOL: Alcohol, Ethyl (B): <5

## 2011-03-12 LAB — BASIC METABOLIC PANEL WITH GFR
Calcium: 9.3
Creatinine, Ser: 0.94
GFR calc Af Amer: >60
GFR calc non Af Amer: 58 — ABNORMAL LOW
Sodium: 141

## 2011-03-12 LAB — BASIC METABOLIC PANEL
BUN: 15
CO2: 25
Chloride: 105
Glucose, Bld: 90
Potassium: 4.6

## 2011-03-12 LAB — DIFFERENTIAL
Basophils Absolute: 0.1
Basophils Relative: 1
Eosinophils Absolute: 0.1
Eosinophils Relative: 1
Lymphocytes Relative: 23
Lymphs Abs: 2
Monocytes Absolute: 0.9 — ABNORMAL HIGH
Monocytes Relative: 10
Neutro Abs: 5.7
Neutrophils Relative %: 65

## 2011-03-12 LAB — CBC
HCT: 32.6 — ABNORMAL LOW
Hemoglobin: 11 — ABNORMAL LOW
MCHC: 33.9
MCV: 101.1 — ABNORMAL HIGH
Platelets: 357
RBC: 3.23 — ABNORMAL LOW
RDW: 16.3 — ABNORMAL HIGH
WBC: 8.8

## 2011-03-12 LAB — PROTIME-INR
INR: 0.9
Prothrombin Time: 12.1

## 2011-03-12 LAB — ABO/RH: ABO/RH(D): A POS

## 2011-03-12 LAB — APTT: aPTT: 26

## 2011-03-12 LAB — TYPE AND SCREEN
ABO/RH(D): A POS
Antibody Screen: NEGATIVE

## 2011-03-12 LAB — ETHANOL: Alcohol, Ethyl (B): 126 — ABNORMAL HIGH

## 2011-03-18 LAB — BASIC METABOLIC PANEL
BUN: 31 — ABNORMAL HIGH
CO2: 28
CO2: 32
Calcium: 9.2
Chloride: 99
Chloride: 100
Creatinine, Ser: 1.72 — ABNORMAL HIGH
GFR calc Af Amer: 35 — ABNORMAL LOW
GFR calc non Af Amer: 31 — ABNORMAL LOW
Glucose, Bld: 107 — ABNORMAL HIGH
Glucose, Bld: 110 — ABNORMAL HIGH
Potassium: 4.3
Sodium: 142
Sodium: 137

## 2011-03-18 LAB — COMPREHENSIVE METABOLIC PANEL
Alkaline Phosphatase: 83
BUN: 30 — ABNORMAL HIGH
Chloride: 100
GFR calc non Af Amer: 35 — ABNORMAL LOW
Glucose, Bld: 102 — ABNORMAL HIGH
Potassium: 3.7
Total Bilirubin: 0.9

## 2011-03-18 LAB — URINALYSIS, ROUTINE W REFLEX MICROSCOPIC
Ketones, ur: NEGATIVE
Nitrite: NEGATIVE
Protein, ur: NEGATIVE
Urobilinogen, UA: 1

## 2011-03-18 LAB — RAPID URINE DRUG SCREEN, HOSP PERFORMED
Opiates: NOT DETECTED
Tetrahydrocannabinol: NOT DETECTED

## 2011-03-18 LAB — CBC
HCT: 38
Hemoglobin: 13
WBC: 6.2

## 2011-03-18 LAB — TSH: TSH: 0.119 — ABNORMAL LOW

## 2011-03-18 LAB — ETHANOL: Alcohol, Ethyl (B): <5

## 2011-03-19 LAB — BASIC METABOLIC PANEL
CO2: 25
Calcium: 9.8
Glucose, Bld: 107 — ABNORMAL HIGH
Potassium: 4.1
Sodium: 134 — ABNORMAL LOW

## 2011-03-30 ENCOUNTER — Other Ambulatory Visit: Payer: Self-pay | Admitting: *Deleted

## 2011-03-30 MED ORDER — HYDROCODONE-ACETAMINOPHEN 10-325 MG PO TABS: 2.0000 | ORAL_TABLET | Freq: Two times a day (BID) | ORAL | Status: DC

## 2011-03-30 NOTE — Telephone Encounter (Signed)
Faxed script back to [facility] @ [phone]...03/30/11@12 :57pm/LMB

## 2011-04-02 ENCOUNTER — Encounter (HOSPITAL_BASED_OUTPATIENT_CLINIC_OR_DEPARTMENT_OTHER): Payer: Medicare Other | Attending: Internal Medicine

## 2011-04-02 DIAGNOSIS — L97809 Non-pressure chronic ulcer of other part of unspecified lower leg with unspecified severity: Secondary | ICD-10-CM | POA: Insufficient documentation

## 2011-04-02 DIAGNOSIS — M069 Rheumatoid arthritis, unspecified: Secondary | ICD-10-CM | POA: Insufficient documentation

## 2011-04-02 DIAGNOSIS — E039 Hypothyroidism, unspecified: Secondary | ICD-10-CM | POA: Insufficient documentation

## 2011-04-02 DIAGNOSIS — Z79899 Other long term (current) drug therapy: Secondary | ICD-10-CM | POA: Insufficient documentation

## 2011-04-02 DIAGNOSIS — I872 Venous insufficiency (chronic) (peripheral): Secondary | ICD-10-CM | POA: Insufficient documentation

## 2011-04-10 DIAGNOSIS — L97809 Non-pressure chronic ulcer of other part of unspecified lower leg with unspecified severity: Secondary | ICD-10-CM

## 2011-04-10 DIAGNOSIS — I872 Venous insufficiency (chronic) (peripheral): Secondary | ICD-10-CM

## 2011-04-10 DIAGNOSIS — R262 Difficulty in walking, not elsewhere classified: Secondary | ICD-10-CM

## 2011-04-10 DIAGNOSIS — M069 Rheumatoid arthritis, unspecified: Secondary | ICD-10-CM

## 2011-04-18 ENCOUNTER — Other Ambulatory Visit: Payer: Self-pay | Admitting: Internal Medicine

## 2011-05-07 ENCOUNTER — Encounter (HOSPITAL_BASED_OUTPATIENT_CLINIC_OR_DEPARTMENT_OTHER): Payer: Medicare Other | Attending: Internal Medicine

## 2011-05-07 DIAGNOSIS — M069 Rheumatoid arthritis, unspecified: Secondary | ICD-10-CM | POA: Insufficient documentation

## 2011-05-07 DIAGNOSIS — L97809 Non-pressure chronic ulcer of other part of unspecified lower leg with unspecified severity: Secondary | ICD-10-CM | POA: Insufficient documentation

## 2011-05-07 DIAGNOSIS — I872 Venous insufficiency (chronic) (peripheral): Secondary | ICD-10-CM | POA: Insufficient documentation

## 2011-05-07 DIAGNOSIS — Z79899 Other long term (current) drug therapy: Secondary | ICD-10-CM | POA: Insufficient documentation

## 2011-05-07 DIAGNOSIS — E039 Hypothyroidism, unspecified: Secondary | ICD-10-CM | POA: Insufficient documentation

## 2011-05-20 ENCOUNTER — Other Ambulatory Visit: Payer: Self-pay | Admitting: *Deleted

## 2011-05-20 MED ORDER — FUROSEMIDE 40 MG PO TABS: 40.0000 mg | ORAL_TABLET | Freq: Every day | ORAL | Status: DC

## 2011-05-20 MED ORDER — THEOPHYLLINE 200 MG PO TB12: 200.0000 mg | ORAL_TABLET | Freq: Every day | ORAL | Status: DC

## 2011-05-25 ENCOUNTER — Encounter: Payer: Self-pay | Admitting: Internal Medicine

## 2011-05-25 ENCOUNTER — Ambulatory Visit (INDEPENDENT_AMBULATORY_CARE_PROVIDER_SITE_OTHER): Payer: Medicare Other | Admitting: Internal Medicine

## 2011-05-25 DIAGNOSIS — S50909A Unspecified superficial injury of unspecified elbow, initial encounter: Secondary | ICD-10-CM

## 2011-05-25 DIAGNOSIS — M069 Rheumatoid arthritis, unspecified: Secondary | ICD-10-CM

## 2011-05-25 DIAGNOSIS — S81009A Unspecified open wound, unspecified knee, initial encounter: Secondary | ICD-10-CM

## 2011-05-25 DIAGNOSIS — M109 Gout, unspecified: Secondary | ICD-10-CM

## 2011-05-25 DIAGNOSIS — L97809 Non-pressure chronic ulcer of other part of unspecified lower leg with unspecified severity: Secondary | ICD-10-CM

## 2011-05-25 DIAGNOSIS — I10 Essential (primary) hypertension: Secondary | ICD-10-CM

## 2011-05-25 DIAGNOSIS — S50919A Unspecified superficial injury of unspecified forearm, initial encounter: Secondary | ICD-10-CM

## 2011-05-25 DIAGNOSIS — I872 Venous insufficiency (chronic) (peripheral): Secondary | ICD-10-CM

## 2011-05-25 DIAGNOSIS — S81809A Unspecified open wound, unspecified lower leg, initial encounter: Secondary | ICD-10-CM

## 2011-05-25 DIAGNOSIS — S60919A Unspecified superficial injury of unspecified wrist, initial encounter: Secondary | ICD-10-CM

## 2011-05-25 NOTE — Assessment & Plan Note (Signed)
Chronic severe symptoms - continue uloric and pred As with RA, encouraged rheum eval to help with same - refer done today Increase monthly supply of tramadol for pain to offset hydrocodone use

## 2011-05-25 NOTE — Assessment & Plan Note (Signed)
On chronic pred, no acute flares continue HHPT/Ot to supplement ongoing aide (private) Also again encouraged consideration of rheum eval and tx - will refer to [facility] (prev seen by [facility] and declines return there)

## 2011-05-25 NOTE — Assessment & Plan Note (Signed)
BP Readings from Last 3 Encounters:  05/25/11 132/74  02/25/11 146/78  02/06/11 100/52   The current medical regimen is effective;  continue present plan and medications.

## 2011-05-25 NOTE — Patient Instructions (Signed)
It was good to see you today. Medications reviewed, no changes at this time. we'll make referral to Dr. Dareen Piano for rheumatoid and gout management . Our office will contact you regarding appointment(s) once made. We have reviewed your prior records including labs and tests today Continue working with wound care center as ongoing Please schedule followup in 4 months, call sooner if problems.

## 2011-05-25 NOTE — Progress Notes (Signed)
Subjective:    Patient ID: Connie Fitzpatrick, female    DOB: 11/19/1927, 77 y.o.   MRN: [id number]  HPI  Here for follow up - reviewed chronic medical issues:  Leg wounds, BLE edema - seen by wound clinic for same - tx silver and wrapping - exac by recurrent falls precipitated when not using walker  Asthma - takes prn AM dose of theophylline in addition to scheduled qhs dose - no flares   allg rhinitis - continued clear runny nasal discharge despite OTC and steroid nasal spray - no congestion, no sneezing, no HA or fever  chronic pain - complicated by severe arthritis (RA, gout) - hands, wrists, shoulders - follows with ortho ([facility]) and planning to see hand re: L left wrist pain and swelling, hx fx 09/2006 following fall - off sched norco and takes cymbalta for this and depression symptoms   severe gout - also advanced RA and OA/DJD - on chronic pred for control same + uloric - prev tophi now resolved with chronic pred use -   depression and anxiety - associated with chronic insomnia. complicated by hx alcohol abuse - chronic sched BZ + cymbalta - reports compliance with ongoing medical treatment and no changes in medication dose or frequency. denies adverse side effects related to current therapy.   hypothyroid - reports compliance with ongoing medical treatment and no changes in medication dose or frequency. denies adverse side effects related to current therapy.  No weight, bowel or skin changes  Past Medical History  Diagnosis Date  . Gout   . INSOMNIA   . ALLERGIC RHINITIS   . ASTHMA   . OSTEOARTHRITIS   . Rheumatoid arthritis   . HYPERTENSION   . HYPERLIPIDEMIA   . GERD (gastroesophageal reflux disease)   . Depression   . ALCOHOL ABUSE     hx detox, dry since 2010  . HYPOTHYROIDISM   . URINARY INCONTINENCE      Review of Systems  Constitutional: Negative for fever and unexpected weight change.  Respiratory: Negative for shortness of breath.   Cardiovascular: Negative for  chest pain.  Musculoskeletal: Positive for joint swelling and arthralgias.       Objective:   Physical Exam  BP 132/74  Pulse 78  Temp(Src) 97.4 F (36.3 C) (Oral)  Ht 4\' 9"  (1.448 m)  Wt 111 lb 0.6 oz (50.367 kg)  BMI 24.03 kg/m2  SpO2 95%   Wt Readings from Last 3 Encounters:  05/25/11 111 lb 0.6 oz (50.367 kg)  02/25/11 115 lb 6.4 oz (52.345 kg)  01/12/11 114 lb 12.8 oz (52.073 kg)   Constitutional: She is chronically ill appearing; She appears well-developed and well-nourished. No distress. spouse at side Neck: Normal range of motion. Neck supple. No JVD present. No thyromegaly present.  Cardiovascular: Normal rate, regular rhythm and normal heart sounds.  No murmur heard. No BLE edema. Pulmonary/Chest: Effort normal and breath sounds normal. No respiratory distress. She has no wheezes. Musculoskeletal: chronic rheumatoid deformities, esp L>R wrist pannus, fingers with MCP deformities - indep ambulatory with RW Neurological: She is alert and oriented to person, place, and time. No cranial nerve deficit. Coordination normal.  Skin: Skin is warm and dry. No rash noted. No erythema. legs not examined today- wrapping/dressing c/d/i Psychiatric: She has a normal mood and affect. Her behavior is normal. Judgment and thought content normal.      Lab Results  Component Value Date   WBC 8.0 12/06/2010   HGB 11.4* 12/06/2010  HCT 33.9* 12/06/2010   PLT 260 12/06/2010   CHOL 190 12/04/2010   TRIG 179* 12/04/2010   HDL 46 12/04/2010   LDLDIRECT 130.5 11/03/2010   ALT 20 12/05/2010   AST 20 12/05/2010   NA 132* 12/08/2010   K 4.8 12/08/2010   CL 102 12/08/2010   CREATININE 0.74 12/08/2010   BUN 17 12/08/2010   CO2 24 12/08/2010   TSH 0.62 11/03/2010   INR 0.9 03/10/2007    Assessment & Plan:  See problem list. Medications and labs reviewed today.  BLE wounds - inflicted by recurrent falls (caused by deforming arthritis and chornic pain) - commended on decreased Noco use - pt recognized Norco increasing  confusion and falls without helping pain - working with wound care center on same - needs to address caused of pain - severe RA/gout symptoms uncontrolled - see next

## 2011-06-04 ENCOUNTER — Encounter (HOSPITAL_BASED_OUTPATIENT_CLINIC_OR_DEPARTMENT_OTHER): Payer: Medicare Other | Attending: Internal Medicine

## 2011-06-04 DIAGNOSIS — L97809 Non-pressure chronic ulcer of other part of unspecified lower leg with unspecified severity: Secondary | ICD-10-CM | POA: Insufficient documentation

## 2011-06-04 DIAGNOSIS — I872 Venous insufficiency (chronic) (peripheral): Secondary | ICD-10-CM | POA: Insufficient documentation

## 2011-07-03 ENCOUNTER — Ambulatory Visit (INDEPENDENT_AMBULATORY_CARE_PROVIDER_SITE_OTHER): Payer: Medicare Other | Admitting: Internal Medicine

## 2011-07-03 ENCOUNTER — Encounter: Payer: Self-pay | Admitting: Internal Medicine

## 2011-07-03 VITALS — BP 130/72 | HR 81 | Temp 96.7°F | Wt 113.1 lb

## 2011-07-03 DIAGNOSIS — N63 Unspecified lump in unspecified breast: Secondary | ICD-10-CM

## 2011-07-03 DIAGNOSIS — N6320 Unspecified lump in the left breast, unspecified quadrant: Secondary | ICD-10-CM

## 2011-07-03 DIAGNOSIS — R229 Localized swelling, mass and lump, unspecified: Secondary | ICD-10-CM

## 2011-07-03 DIAGNOSIS — R2230 Localized swelling, mass and lump, unspecified upper limb: Secondary | ICD-10-CM

## 2011-07-03 NOTE — Progress Notes (Signed)
Subjective:    Patient ID: Connie Fitzpatrick, female    DOB: 10-Feb-1928, 78 y.o.   MRN: [id number]  HPI  complains of "lump" under L arm onset (1st noticed) 2-3 weeks ago by aide during bath Since onset, ? increase change in size Not associated with tenderness, redness No other skin changes, no fever Denies injury or trauma Reports prior hx breast cysts prompting removal of breast tissue late 1970s/early80s - denies cancer  Also reviewed chronic medical issues:  Leg wounds, BLE edema - seen by wound clinic for same - tx silver and wrapping - exac by recurrent falls precipitated when not using walker  Asthma - takes prn AM dose of theophylline in addition to scheduled qhs dose - no flares   allg rhinitis - continued clear runny nasal discharge despite OTC and steroid nasal spray - no congestion, no sneezing, no HA or fever  chronic pain - complicated by severe arthritis (RA, gout) - hands, wrists, shoulders - follows with ortho ([facility]) and planning to see hand re: L left wrist pain and swelling, hx fx 09/2006 following fall - off sched norco and takes cymbalta for this and depression symptoms   severe gout - also advanced RA and OA/DJD - on chronic pred for control same + uloric - prev tophi now resolved with chronic pred use -   depression and anxiety - associated with chronic insomnia. complicated by hx alcohol abuse - chronic sched BZ + cymbalta - reports compliance with ongoing medical treatment and no changes in medication dose or frequency. denies adverse side effects related to current therapy.   hypothyroid - reports compliance with ongoing medical treatment and no changes in medication dose or frequency. denies adverse side effects related to current therapy.  No weight, bowel or skin changes    Past Medical History  Diagnosis Date  . Gout   . INSOMNIA   . ALLERGIC RHINITIS   . ASTHMA   . OSTEOARTHRITIS   . Rheumatoid arthritis   . HYPERTENSION   . HYPERLIPIDEMIA   . GERD  (gastroesophageal reflux disease)   . Depression   . ALCOHOL ABUSE     hx detox, dry since 2010  . HYPOTHYROIDISM   . URINARY INCONTINENCE      Review of Systems  Constitutional: Negative for fever and unexpected weight change.  Skin: Negative for color change, pallor, rash and wound.       Objective:   Physical Exam BP 130/72  Pulse 81  Temp(Src) 96.7 F (35.9 C) (Oral)  Wt 113 lb 1.9 oz (51.311 kg)  SpO2 97% Wt Readings from Last 3 Encounters:  07/03/11 113 lb 1.9 oz (51.311 kg)  05/25/11 111 lb 0.6 oz (50.367 kg)  02/25/11 115 lb 6.4 oz (52.345 kg)    Constitutional: She is chronically ill appearing; She appears well-developed and well-nourished. No distress. spouse at side Cardiovascular: Normal rate, regular rhythm and normal heart sounds.  No murmur heard. No BLE edema. Pulmonary/Chest: Effort normal and breath sounds normal. No respiratory distress. She has no wheezes. Musculoskeletal: chronic rheumatoid deformities, esp L>R wrist pannus, fingers with MCP deformities - indep ambulatory with RW Skin: soft tissue swelling with 3rd space fluid collection at L axilla/anterior chest wall - above "breast" - contains palpable freely mobile 3 cm round cyst-structure - non tender, no redness or bruising - Skin is warm and dry. No rash noted. No erythema. legs not examined today- RLE wrapping/dressing c/d/i Psychiatric: She has a normal mood and affect. Her behavior  is normal. Judgment and thought content normal.   Lab Results  Component Value Date   WBC 8.0 12/06/2010   HGB 11.4* 12/06/2010   HCT 33.9* 12/06/2010   PLT 260 12/06/2010   GLUCOSE 85 12/08/2010   CHOL 190 12/04/2010   TRIG 179* 12/04/2010   HDL 46 12/04/2010   LDLDIRECT 130.5 11/03/2010   LDLCALC 108* 12/04/2010   ALT 20 12/05/2010   AST 20 12/05/2010   NA 132* 12/08/2010   K 4.8 12/08/2010   CL 102 12/08/2010   CREATININE 0.74 12/08/2010   BUN 17 12/08/2010   CO2 24 12/08/2010   TSH 0.62 11/03/2010   INR 0.9 03/10/2007           Assessment & Plan:  L axilla/breast "cyst" - ?inflammatory vs other - refer for dx US/mammo - reports prior remote breast resection for similar problems (>25y ago) No signs infection

## 2011-07-03 NOTE — Patient Instructions (Signed)
It was good to see you today. we'll make referral to breast center for mammo and ultrasound of your lump. Our office will contact you regarding appointment(s) once made.

## 2011-07-09 ENCOUNTER — Encounter (HOSPITAL_BASED_OUTPATIENT_CLINIC_OR_DEPARTMENT_OTHER): Payer: Medicare Other | Attending: Internal Medicine

## 2011-07-09 DIAGNOSIS — I872 Venous insufficiency (chronic) (peripheral): Secondary | ICD-10-CM | POA: Insufficient documentation

## 2011-07-09 DIAGNOSIS — L97809 Non-pressure chronic ulcer of other part of unspecified lower leg with unspecified severity: Secondary | ICD-10-CM | POA: Insufficient documentation

## 2011-07-13 ENCOUNTER — Other Ambulatory Visit: Payer: Medicare Other

## 2011-07-27 ENCOUNTER — Other Ambulatory Visit: Payer: Self-pay | Admitting: *Deleted

## 2011-07-27 MED ORDER — ALPRAZOLAM 0.5 MG PO TABS: 0.5000 mg | ORAL_TABLET | Freq: Three times a day (TID) | ORAL | Status: DC

## 2011-07-28 NOTE — Telephone Encounter (Signed)
Faxed script back to [facility] @ [phone]... 07/28/11@9 :32am/LMB

## 2011-08-07 ENCOUNTER — Encounter: Payer: Self-pay | Admitting: Internal Medicine

## 2011-08-08 ENCOUNTER — Other Ambulatory Visit: Payer: Self-pay | Admitting: Internal Medicine

## 2011-08-13 ENCOUNTER — Other Ambulatory Visit: Payer: Self-pay | Admitting: Internal Medicine

## 2011-08-14 ENCOUNTER — Other Ambulatory Visit: Payer: Self-pay | Admitting: Internal Medicine

## 2011-08-21 ENCOUNTER — Encounter (HOSPITAL_BASED_OUTPATIENT_CLINIC_OR_DEPARTMENT_OTHER): Payer: Medicare Other | Attending: General Surgery

## 2011-08-21 DIAGNOSIS — IMO0002 Reserved for concepts with insufficient information to code with codable children: Secondary | ICD-10-CM | POA: Insufficient documentation

## 2011-08-21 DIAGNOSIS — I10 Essential (primary) hypertension: Secondary | ICD-10-CM | POA: Insufficient documentation

## 2011-08-21 DIAGNOSIS — Z79899 Other long term (current) drug therapy: Secondary | ICD-10-CM | POA: Insufficient documentation

## 2011-08-21 DIAGNOSIS — M129 Arthropathy, unspecified: Secondary | ICD-10-CM | POA: Insufficient documentation

## 2011-08-21 DIAGNOSIS — F329 Major depressive disorder, single episode, unspecified: Secondary | ICD-10-CM | POA: Insufficient documentation

## 2011-08-21 DIAGNOSIS — M069 Rheumatoid arthritis, unspecified: Secondary | ICD-10-CM | POA: Insufficient documentation

## 2011-08-21 DIAGNOSIS — F3289 Other specified depressive episodes: Secondary | ICD-10-CM | POA: Insufficient documentation

## 2011-08-21 DIAGNOSIS — I872 Venous insufficiency (chronic) (peripheral): Secondary | ICD-10-CM | POA: Insufficient documentation

## 2011-08-21 DIAGNOSIS — W19XXXA Unspecified fall, initial encounter: Secondary | ICD-10-CM | POA: Insufficient documentation

## 2011-08-21 NOTE — Progress Notes (Signed)
[facility]  NAME:  Connie Fitzpatrick, Connie Fitzpatrick NO.:  [id number]  MEDICAL RECORD NO.:  [id number]      DATE OF BIRTH:  1928/05/08  PHYSICIAN:  Ardath Sax, M.D.           VISIT DATE:                                  OFFICE VISIT   This is an 78 year old woman who for some reason keeps losing her balance even though she has got a walker and she falls and gets different types of bruises and abrasions.  Today, she comes in because on the anterior surface of her right below the left knee is an abrasion about 3 cm in diameter.  She states that her doctors have placed her on Xanax, and this is not only for depression but for treatment of her severe arthritis.  She also takes lisinopril for hypertension, and she has some venous stasis.  Her rheumatoid arthritis is most severe in her hands.  She has a husband that takes good care of her, and he said he cannot help her any more than he does, walk in a walker, and he helps her, but she still has periods where she just falls out and apparently this has been a very thoroughly evaluated by her medical doctor, and she has not had any sort of brain problem or heart block problem, which is causing her to have these falling out spells, so we gave her some collagen and we wrapped it up, and she will come back in a week.  I suspect this will heal up fairly quickly.     Ardath Sax, M.D.     PP/MEDQ  D:  08/21/2011  T:  08/21/2011  Job:  [id number]

## 2011-09-04 ENCOUNTER — Other Ambulatory Visit: Payer: Self-pay | Admitting: Internal Medicine

## 2011-09-04 ENCOUNTER — Encounter (HOSPITAL_BASED_OUTPATIENT_CLINIC_OR_DEPARTMENT_OTHER): Payer: Medicare Other | Attending: General Surgery

## 2011-09-04 DIAGNOSIS — L97809 Non-pressure chronic ulcer of other part of unspecified lower leg with unspecified severity: Secondary | ICD-10-CM | POA: Insufficient documentation

## 2011-09-09 ENCOUNTER — Other Ambulatory Visit: Payer: Self-pay | Admitting: Internal Medicine

## 2011-09-12 ENCOUNTER — Other Ambulatory Visit: Payer: Self-pay | Admitting: Internal Medicine

## 2011-09-24 ENCOUNTER — Other Ambulatory Visit: Payer: Self-pay

## 2011-09-24 NOTE — Telephone Encounter (Signed)
Pt's spouse called stating that wound center MD states that prednisone is causing wound to be resistant to healing (makes skin thin?). Spouse was advised that pt's prednisone needs to be reduced, please advise.

## 2011-09-24 NOTE — Telephone Encounter (Signed)
Agree with advice from wound care center - but decrease dose ay also precipitated flare of arthritis pain -   Will reduce pred to 5 mg daily for skin - If new flare of arthritis pain on decreased dose, pt should call their rheumatologist for alternative treatments

## 2011-09-25 ENCOUNTER — Encounter (HOSPITAL_BASED_OUTPATIENT_CLINIC_OR_DEPARTMENT_OTHER): Payer: Medicare Other

## 2011-09-25 MED ORDER — PREDNISONE 5 MG PO TABS: 5.0000 mg | ORAL_TABLET | Freq: Every day | ORAL | Status: DC

## 2011-09-25 NOTE — Telephone Encounter (Signed)
Pt's spouse advised and expressed understanding

## 2011-10-01 ENCOUNTER — Encounter (HOSPITAL_BASED_OUTPATIENT_CLINIC_OR_DEPARTMENT_OTHER): Payer: Medicare Other | Attending: Internal Medicine

## 2011-10-01 DIAGNOSIS — I872 Venous insufficiency (chronic) (peripheral): Secondary | ICD-10-CM | POA: Insufficient documentation

## 2011-10-01 DIAGNOSIS — L97809 Non-pressure chronic ulcer of other part of unspecified lower leg with unspecified severity: Secondary | ICD-10-CM | POA: Insufficient documentation

## 2011-10-01 DIAGNOSIS — M069 Rheumatoid arthritis, unspecified: Secondary | ICD-10-CM | POA: Insufficient documentation

## 2011-10-01 DIAGNOSIS — IMO0002 Reserved for concepts with insufficient information to code with codable children: Secondary | ICD-10-CM | POA: Insufficient documentation

## 2011-10-02 ENCOUNTER — Encounter (HOSPITAL_BASED_OUTPATIENT_CLINIC_OR_DEPARTMENT_OTHER): Payer: Medicare Other

## 2011-10-06 ENCOUNTER — Other Ambulatory Visit: Payer: Self-pay | Admitting: Internal Medicine

## 2011-10-08 ENCOUNTER — Other Ambulatory Visit: Payer: Self-pay | Admitting: Internal Medicine

## 2011-10-15 NOTE — Progress Notes (Signed)
[facility]  NAME:  TYLIAH, SCHLERETH NO.:  [id number]  MEDICAL RECORD NO.:  [id number]      DATE OF BIRTH:  05/22/1928  PHYSICIAN:  Maxwell Caul, M.D. VISIT DATE:  10/15/2011                                  OFFICE VISIT   HISTORY:  Ms. Eilene Voigt who is a lady we have been following here since September 2012 for severe bilateral venous stasis ulceration complicated by chronic steroid use for her advanced deforming rheumatoid arthritis. Since her arrival here in September 2012 with bilateral wounds on her lower extremities, we have had I think at least 2 falls and 1 trauma to her legs resulting in additional wounds.  We have really treated these conservatively with topical foam, Kerlix, Coban wraps.  The wounds heal, however, for the above reasons, she has had recurrences.  I believe we actually had discharged her in February only to have her show back up in March.  At this point, all of her wounds are healed.  There are no open areas. She has a pair of her own stockings, which I think are similar to a TED hose and their pressure.  She probably could use more than that; however, she tells me she could not get them on.  Finally, I would suggest a fall review to make sure that there is nothing else they could be contributing to, or something else that could be done for this lady's recurrent trauma.  This evaluation could be done by her primary physician.  We have discharged the patient from the clinic, however, given her past history, we had all surprising to have her show back up here in the next month or two.          ______________________________ Maxwell Caul, M.D.     MGR/MEDQ  D:  10/15/2011  T:  10/15/2011  Job:  [id number]

## 2011-10-26 ENCOUNTER — Encounter (HOSPITAL_COMMUNITY): Payer: Self-pay | Admitting: *Deleted

## 2011-10-26 ENCOUNTER — Inpatient Hospital Stay (HOSPITAL_COMMUNITY): Payer: Medicare Other | Admitting: *Deleted

## 2011-10-26 ENCOUNTER — Inpatient Hospital Stay (HOSPITAL_COMMUNITY)
Admission: EM | Admit: 2011-10-26 | Discharge: 2011-11-04 | DRG: 329 | Disposition: A | Discharge status: Skilled Nursing Facility | Payer: Medicare Other | Attending: General Surgery | Admitting: General Surgery

## 2011-10-26 ENCOUNTER — Encounter (HOSPITAL_COMMUNITY): Payer: Self-pay

## 2011-10-26 ENCOUNTER — Emergency Department (HOSPITAL_COMMUNITY): Payer: Medicare Other

## 2011-10-26 ENCOUNTER — Inpatient Hospital Stay (HOSPITAL_COMMUNITY): Payer: Medicare Other

## 2011-10-26 ENCOUNTER — Encounter (HOSPITAL_COMMUNITY): Admission: EM | Disposition: A | Discharge status: Skilled Nursing Facility | Payer: Self-pay | Source: Home / Self Care

## 2011-10-26 DIAGNOSIS — R69 Illness, unspecified: Secondary | ICD-10-CM

## 2011-10-26 DIAGNOSIS — G4700 Insomnia, unspecified: Secondary | ICD-10-CM

## 2011-10-26 DIAGNOSIS — E785 Hyperlipidemia, unspecified: Secondary | ICD-10-CM | POA: Diagnosis present

## 2011-10-26 DIAGNOSIS — F3289 Other specified depressive episodes: Secondary | ICD-10-CM | POA: Diagnosis present

## 2011-10-26 DIAGNOSIS — IMO0002 Reserved for concepts with insufficient information to code with codable children: Secondary | ICD-10-CM | POA: Diagnosis present

## 2011-10-26 DIAGNOSIS — M1990 Unspecified osteoarthritis, unspecified site: Secondary | ICD-10-CM | POA: Diagnosis present

## 2011-10-26 DIAGNOSIS — R5381 Other malaise: Secondary | ICD-10-CM | POA: Diagnosis not present

## 2011-10-26 DIAGNOSIS — Z96659 Presence of unspecified artificial knee joint: Secondary | ICD-10-CM

## 2011-10-26 DIAGNOSIS — F1011 Alcohol abuse, in remission: Secondary | ICD-10-CM | POA: Diagnosis present

## 2011-10-26 DIAGNOSIS — K650 Generalized (acute) peritonitis: Secondary | ICD-10-CM | POA: Diagnosis not present

## 2011-10-26 DIAGNOSIS — Z79899 Other long term (current) drug therapy: Secondary | ICD-10-CM

## 2011-10-26 DIAGNOSIS — E2749 Other adrenocortical insufficiency: Secondary | ICD-10-CM

## 2011-10-26 DIAGNOSIS — G9340 Encephalopathy, unspecified: Secondary | ICD-10-CM | POA: Diagnosis not present

## 2011-10-26 DIAGNOSIS — I10 Essential (primary) hypertension: Secondary | ICD-10-CM | POA: Diagnosis present

## 2011-10-26 DIAGNOSIS — K6389 Other specified diseases of intestine: Secondary | ICD-10-CM

## 2011-10-26 DIAGNOSIS — M069 Rheumatoid arthritis, unspecified: Secondary | ICD-10-CM | POA: Diagnosis present

## 2011-10-26 DIAGNOSIS — F0390 Unspecified dementia without behavioral disturbance: Secondary | ICD-10-CM | POA: Diagnosis present

## 2011-10-26 DIAGNOSIS — J95821 Acute postprocedural respiratory failure: Secondary | ICD-10-CM | POA: Diagnosis not present

## 2011-10-26 DIAGNOSIS — J45909 Unspecified asthma, uncomplicated: Secondary | ICD-10-CM | POA: Diagnosis present

## 2011-10-26 DIAGNOSIS — T184XXA Foreign body in colon, initial encounter: Principal | ICD-10-CM | POA: Diagnosis present

## 2011-10-26 DIAGNOSIS — F329 Major depressive disorder, single episode, unspecified: Secondary | ICD-10-CM | POA: Diagnosis present

## 2011-10-26 DIAGNOSIS — Z9849 Cataract extraction status, unspecified eye: Secondary | ICD-10-CM

## 2011-10-26 DIAGNOSIS — K631 Perforation of intestine (nontraumatic): Secondary | ICD-10-CM | POA: Diagnosis present

## 2011-10-26 DIAGNOSIS — N179 Acute kidney failure, unspecified: Secondary | ICD-10-CM | POA: Diagnosis not present

## 2011-10-26 DIAGNOSIS — R198 Other specified symptoms and signs involving the digestive system and abdomen: Secondary | ICD-10-CM

## 2011-10-26 DIAGNOSIS — M109 Gout, unspecified: Secondary | ICD-10-CM | POA: Diagnosis present

## 2011-10-26 DIAGNOSIS — J9600 Acute respiratory failure, unspecified whether with hypoxia or hypercapnia: Secondary | ICD-10-CM

## 2011-10-26 DIAGNOSIS — E039 Hypothyroidism, unspecified: Secondary | ICD-10-CM | POA: Diagnosis present

## 2011-10-26 DIAGNOSIS — R32 Unspecified urinary incontinence: Secondary | ICD-10-CM | POA: Diagnosis present

## 2011-10-26 DIAGNOSIS — K219 Gastro-esophageal reflux disease without esophagitis: Secondary | ICD-10-CM | POA: Diagnosis present

## 2011-10-26 DIAGNOSIS — IMO0001 Reserved for inherently not codable concepts without codable children: Secondary | ICD-10-CM

## 2011-10-26 DIAGNOSIS — T183XXA Foreign body in small intestine, initial encounter: Principal | ICD-10-CM | POA: Diagnosis present

## 2011-10-26 DIAGNOSIS — D473 Essential (hemorrhagic) thrombocythemia: Secondary | ICD-10-CM | POA: Diagnosis present

## 2011-10-26 DIAGNOSIS — J309 Allergic rhinitis, unspecified: Secondary | ICD-10-CM

## 2011-10-26 HISTORY — PX: BOWEL RESECTION: SHX1257

## 2011-10-26 HISTORY — PX: LAPAROTOMY: SHX154

## 2011-10-26 LAB — POCT I-STAT 3, VENOUS BLOOD GAS (G3P V)
Acid-Base Excess: 5 mmol/L — ABNORMAL HIGH (ref 0.0–2.0)
Bicarbonate: 31.4 mEq/L — ABNORMAL HIGH (ref 20.0–24.0)
O2 Saturation: 46 %
TCO2: 33 mmol/L (ref 0–100)
pCO2, Ven: 56.2 mmHg — ABNORMAL HIGH (ref 45.0–50.0)
pH, Ven: 7.355 — ABNORMAL HIGH (ref 7.250–7.300)
pO2, Ven: 27 mmHg — CL (ref 30.0–45.0)

## 2011-10-26 LAB — CBC
HCT: 38.8 % (ref 36.0–46.0)
Hemoglobin: 12.3 g/dL (ref 12.0–15.0)
MCH: 30.8 pg (ref 26.0–34.0)
MCHC: 31.7 g/dL (ref 30.0–36.0)
MCV: 97 fL (ref 78.0–100.0)
Platelets: 418 10*3/uL — ABNORMAL HIGH (ref 150–400)
RBC: 4 MIL/uL (ref 3.87–5.11)
RDW: 14.4 % (ref 11.5–15.5)
WBC: 10.2 10*3/uL (ref 4.0–10.5)

## 2011-10-26 LAB — COMPREHENSIVE METABOLIC PANEL
ALT: 9 U/L (ref 0–35)
AST: 18 U/L (ref 0–37)
Albumin: 3.1 g/dL — ABNORMAL LOW (ref 3.5–5.2)
Alkaline Phosphatase: 72 U/L (ref 39–117)
BUN: 24 mg/dL — ABNORMAL HIGH (ref 6–23)
CO2: 28 mEq/L (ref 19–32)
Calcium: 10.2 mg/dL (ref 8.4–10.5)
Chloride: 97 mEq/L (ref 96–112)
Creatinine, Ser: 1.16 mg/dL — ABNORMAL HIGH (ref 0.50–1.10)
GFR calc Af Amer: 50 mL/min — ABNORMAL LOW (ref >90)
GFR calc non Af Amer: 43 mL/min — ABNORMAL LOW (ref >90)
Glucose, Bld: 126 mg/dL — ABNORMAL HIGH (ref 70–99)
Potassium: 3.9 mEq/L (ref 3.5–5.1)
Sodium: 137 mEq/L (ref 135–145)
Total Bilirubin: 0.3 mg/dL (ref 0.3–1.2)
Total Protein: 6.5 g/dL (ref 6.0–8.3)

## 2011-10-26 LAB — DIFFERENTIAL
Basophils Absolute: 0.1 10*3/uL (ref 0.0–0.1)
Basophils Relative: 1 % (ref 0–1)
Eosinophils Absolute: 0.2 10*3/uL (ref 0.0–0.7)
Eosinophils Relative: 2 % (ref 0–5)
Lymphocytes Relative: 26 % (ref 12–46)
Lymphs Abs: 2.6 10*3/uL (ref 0.7–4.0)
Monocytes Absolute: 0.3 10*3/uL (ref 0.1–1.0)
Monocytes Relative: 3 % (ref 3–12)
Neutro Abs: 6.9 10*3/uL (ref 1.7–7.7)
Neutrophils Relative %: 68 % (ref 43–77)

## 2011-10-26 LAB — PROTIME-INR
INR: 1.02 (ref 0.00–1.49)
Prothrombin Time: 13.6 seconds (ref 11.6–15.2)

## 2011-10-26 LAB — TYPE AND SCREEN
ABO/RH(D): A POS
Antibody Screen: NEGATIVE

## 2011-10-26 LAB — LACTIC ACID, PLASMA: Lactic Acid, Venous: 1.5 mmol/L (ref 0.5–2.2)

## 2011-10-26 LAB — LIPASE, BLOOD: Lipase: 18 U/L (ref 11–59)

## 2011-10-26 SURGERY — LAPAROTOMY, EXPLORATORY
Anesthesia: General | Site: Abdomen | Wound class: Clean Contaminated

## 2011-10-26 MED ORDER — GLYCOPYRROLATE 0.2 MG/ML IJ SOLN
INTRAMUSCULAR | Status: DC | PRN
  Administered 2011-10-26 (×2): 0.4 mg via INTRAVENOUS

## 2011-10-26 MED ORDER — SODIUM CHLORIDE 0.9 % IV BOLUS (SEPSIS)
1000.0000 mL | Freq: Once | INTRAVENOUS | Status: AC
  Administered 2011-10-26: 1000 mL via INTRAVENOUS

## 2011-10-26 MED ORDER — SODIUM CHLORIDE 0.9 % IV SOLN
1.0000 g | INTRAVENOUS | Status: DC
  Administered 2011-10-26 (×2): 1 g via INTRAVENOUS
  Filled 2011-10-26 (×2): qty 1

## 2011-10-26 MED ORDER — PANTOPRAZOLE SODIUM 40 MG IV SOLR
40.0000 mg | Freq: Every day | INTRAVENOUS | Status: DC
  Administered 2011-10-27 – 2011-10-28 (×2): 40 mg via INTRAVENOUS
  Filled 2011-10-26 (×2): qty 40

## 2011-10-26 MED ORDER — IOHEXOL 300 MG/ML  SOLN
20.0000 mL | INTRAMUSCULAR | Status: DC
  Administered 2011-10-26: 20 mL via ORAL

## 2011-10-26 MED ORDER — ONDANSETRON HCL 4 MG/2ML IJ SOLN
4.0000 mg | Freq: Once | INTRAMUSCULAR | Status: AC
  Administered 2011-10-26: 4 mg via INTRAVENOUS
  Filled 2011-10-26: qty 2

## 2011-10-26 MED ORDER — 0.9 % SODIUM CHLORIDE (POUR BTL) OPTIME
TOPICAL | Status: DC | PRN
  Administered 2011-10-26 (×6): 1000 mL

## 2011-10-26 MED ORDER — SUCCINYLCHOLINE CHLORIDE 20 MG/ML IJ SOLN
INTRAMUSCULAR | Status: DC | PRN
  Administered 2011-10-26: 100 mg via INTRAVENOUS

## 2011-10-26 MED ORDER — HYDROCORTISONE SOD SUCCINATE 1000 MG IJ SOLR
INTRAMUSCULAR | Status: DC | PRN
  Administered 2011-10-26: 100 mg via INTRAVENOUS

## 2011-10-26 MED ORDER — NEOSTIGMINE METHYLSULFATE 1 MG/ML IJ SOLN
INTRAMUSCULAR | Status: DC | PRN
  Administered 2011-10-26 (×2): 2 mg via INTRAVENOUS

## 2011-10-26 MED ORDER — MORPHINE SULFATE 4 MG/ML IJ SOLN: 4.0000 mg | Freq: Once | INTRAMUSCULAR | Status: DC

## 2011-10-26 MED ORDER — LORAZEPAM 2 MG/ML IJ SOLN
1.0000 mg | Freq: Once | INTRAMUSCULAR | Status: DC
  Filled 2011-10-26: qty 1

## 2011-10-26 MED ORDER — METRONIDAZOLE IN NACL 5-0.79 MG/ML-% IV SOLN
500.0000 mg | Freq: Once | INTRAVENOUS | Status: DC
  Filled 2011-10-26: qty 100

## 2011-10-26 MED ORDER — MORPHINE SULFATE 4 MG/ML IJ SOLN
4.0000 mg | Freq: Once | INTRAMUSCULAR | Status: AC
  Administered 2011-10-26: 4 mg via INTRAVENOUS
  Filled 2011-10-26: qty 1

## 2011-10-26 MED ORDER — LORAZEPAM 2 MG/ML IJ SOLN: 2.0000 mg | INTRAMUSCULAR | Status: DC | PRN

## 2011-10-26 MED ORDER — CEFAZOLIN SODIUM 1-5 GM-% IV SOLN
1.0000 g | Freq: Once | INTRAVENOUS | Status: DC
  Filled 2011-10-26: qty 50

## 2011-10-26 MED ORDER — LACTATED RINGERS IV SOLN
INTRAVENOUS | Status: DC | PRN
  Administered 2011-10-26 (×2): via INTRAVENOUS

## 2011-10-26 MED ORDER — PROPOFOL 10 MG/ML IV EMUL
INTRAVENOUS | Status: DC | PRN
  Administered 2011-10-26: 100 mg via INTRAVENOUS

## 2011-10-26 MED ORDER — ROCURONIUM BROMIDE 100 MG/10ML IV SOLN
INTRAVENOUS | Status: DC | PRN
  Administered 2011-10-26: 30 mg via INTRAVENOUS

## 2011-10-26 MED ORDER — LIDOCAINE HCL (CARDIAC) 20 MG/ML IV SOLN
INTRAVENOUS | Status: DC | PRN
  Administered 2011-10-26: 40 mg via INTRAVENOUS

## 2011-10-26 MED ORDER — PHENYLEPHRINE HCL 10 MG/ML IJ SOLN
INTRAMUSCULAR | Status: DC | PRN
  Administered 2011-10-26: 40 ug via INTRAVENOUS

## 2011-10-26 MED ORDER — FENTANYL CITRATE 0.05 MG/ML IJ SOLN
INTRAMUSCULAR | Status: DC | PRN
  Administered 2011-10-26: 50 ug via INTRAVENOUS
  Administered 2011-10-26: 100 ug via INTRAVENOUS
  Administered 2011-10-26: 50 ug via INTRAVENOUS

## 2011-10-26 MED ORDER — FENTANYL CITRATE 0.05 MG/ML IJ SOLN
25.0000 ug/h | INTRAMUSCULAR | Status: DC
  Administered 2011-10-27: 50 ug/h via INTRAVENOUS
  Filled 2011-10-26: qty 50

## 2011-10-26 MED ORDER — IOHEXOL 300 MG/ML  SOLN
80.0000 mL | Freq: Once | INTRAMUSCULAR | Status: AC | PRN
  Administered 2011-10-26: 80 mL via INTRAVENOUS

## 2011-10-26 SURGICAL SUPPLY — 48 items
BLADE SURG ROTATE 9660 (MISCELLANEOUS) IMPLANT
CANISTER SUCTION 2500CC (MISCELLANEOUS) ×2 IMPLANT
CHLORAPREP W/TINT 26ML (MISCELLANEOUS) ×2 IMPLANT
CLOTH BEACON ORANGE TIMEOUT ST (SAFETY) ×2 IMPLANT
COVER SURGICAL LIGHT HANDLE (MISCELLANEOUS) ×2 IMPLANT
DRAPE LAPAROSCOPIC ABDOMINAL (DRAPES) ×2 IMPLANT
DRAPE UTILITY 15X26 W/TAPE STR (DRAPE) ×4 IMPLANT
DRAPE WARM FLUID 44X44 (DRAPE) ×2 IMPLANT
DRSG COVADERM 4X8 (GAUZE/BANDAGES/DRESSINGS) ×1 IMPLANT
ELECT CAUTERY BLADE 6.4 (BLADE) ×3 IMPLANT
ELECT REM PT RETURN 9FT ADLT (ELECTROSURGICAL) ×2
ELECTRODE REM PT RTRN 9FT ADLT (ELECTROSURGICAL) ×1 IMPLANT
GAUZE SPONGE 4X4 16PLY XRAY LF (GAUZE/BANDAGES/DRESSINGS) IMPLANT
GLOVE BIO SURGEON STRL SZ7 (GLOVE) ×8 IMPLANT
GLOVE BIOGEL PI IND STRL 7.0 (GLOVE) IMPLANT
GLOVE BIOGEL PI IND STRL 7.5 (GLOVE) ×1 IMPLANT
GLOVE BIOGEL PI INDICATOR 7.0 (GLOVE) ×2
GLOVE BIOGEL PI INDICATOR 7.5 (GLOVE) ×2
GLOVE SURG SS PI 7.0 STRL IVOR (GLOVE) ×2 IMPLANT
GOWN STRL NON-REIN LRG LVL3 (GOWN DISPOSABLE) ×2 IMPLANT
KIT BASIN OR (CUSTOM PROCEDURE TRAY) ×2 IMPLANT
KIT ROOM TURNOVER OR (KITS) ×2 IMPLANT
LIGASURE IMPACT 36 18CM CVD LR (INSTRUMENTS) ×2 IMPLANT
NS IRRIG 1000ML POUR BTL (IV SOLUTION) ×7 IMPLANT
PACK GENERAL/GYN (CUSTOM PROCEDURE TRAY) ×2 IMPLANT
PAD ARMBOARD 7.5X6 YLW CONV (MISCELLANEOUS) ×4 IMPLANT
RELOAD PROXIMATE 75MM BLUE (ENDOMECHANICALS) ×4 IMPLANT
RELOAD STAPLE 75 3.8 BLU REG (ENDOMECHANICALS) IMPLANT
SPECIMEN JAR LARGE (MISCELLANEOUS) ×1 IMPLANT
SPONGE GAUZE 4X4 12PLY (GAUZE/BANDAGES/DRESSINGS) ×2 IMPLANT
SPONGE LAP 18X18 X RAY DECT (DISPOSABLE) IMPLANT
STAPLER GUN LINEAR PROX 60 (STAPLE) ×2 IMPLANT
STAPLER PROXIMATE 75MM BLUE (STAPLE) ×1 IMPLANT
STAPLER VISISTAT 35W (STAPLE) ×2 IMPLANT
SUCTION POOLE TIP (SUCTIONS) ×2 IMPLANT
SUT PDS AB 1 TP1 96 (SUTURE) IMPLANT
SUT SILK 2 0 SH CR/8 (SUTURE) ×2 IMPLANT
SUT SILK 2 0 TIES 10X30 (SUTURE) ×2 IMPLANT
SUT SILK 3 0 SH CR/8 (SUTURE) ×2 IMPLANT
SUT SILK 3 0 TIES 10X30 (SUTURE) ×2 IMPLANT
SUT VIC AB 3-0 SH 18 (SUTURE) IMPLANT
TAPE CLOTH SURG 4X10 WHT LF (GAUZE/BANDAGES/DRESSINGS) ×1 IMPLANT
TOWEL OR 17X24 6PK STRL BLUE (TOWEL DISPOSABLE) ×1 IMPLANT
TOWEL OR 17X26 10 PK STRL BLUE (TOWEL DISPOSABLE) IMPLANT
TRAY FOLEY CATH 14FRSI W/METER (CATHETERS) ×1 IMPLANT
UNDERPAD 30X30 INCONTINENT (UNDERPADS AND DIAPERS) ×1 IMPLANT
WATER STERILE IRR 1000ML POUR (IV SOLUTION) ×1 IMPLANT
YANKAUER SUCT BULB TIP NO VENT (SUCTIONS) ×1 IMPLANT

## 2011-10-26 NOTE — ED Notes (Signed)
Patient is resting comfortably. 

## 2011-10-26 NOTE — ED Notes (Signed)
Took patient to surgery

## 2011-10-26 NOTE — Op Note (Signed)
Pre-op Dx:  Perforated viscus Post-op Dx:  Small bowel perforation from swallowed foreign body Procedure:  Exploratory laparotomy with small bowel resection Surgeon:Irem Stoneham K. Assistant: Emelia Loron, M.D. Anesthesia: Gen. endotracheal Indications: This is an 78 year old female with multiple medical comorbidities and dementia who presents with acute onset of severe abdominal pain several hours ago. She presented to the emergency department for evaluation. A CT scan showed inflammation and stranding in the small bowel mesentery with several dots of free air. We were consult to see the patient and recommended immediate exploratory laparotomy despite her multiple medical comorbidities.  Description of procedure: The patient was brought to the operating room and placed in a supine position on the operating room table. After an adequate level of general anesthesia was obtained, a Foley catheter was placed under sterile technique. The patient's abdomen was prepped with chlor prep and draped in sterile fashion. A timeout was taken to ensure the proper patient and proper procedure. We made a vertical midline incision. Dissection was carried down the fascia which was opened vertically. We into the peritoneal cavity. There was purulent fluid in the pelvis. There is a lot of inflammatory adhesions of the small bowel. We opened the fascia widely and placed the Balfour retractor. We then dissected the inflammatory adhesions with the small bowel. We identified the cecum. The appendix appeared normal. We began tracing the small bowel retrograde fashion. Very quickly we identified the area perforation. There is a small pinhole a bout 12 cm proximal to the ileocecal valve. There was a sharp foreign body protruding from this perforation. On palpating the small bowel it appeared that the foreign body was about 3 cm long and about 2 cm wide. We did a segmental small bowel resection using GIA staplers. The mesentery  was divided with the LigaSure device. We created a side-to-side anastomosis using another firing of the GIA-75 stapler. We inspected the staple line internally and there was no bleeding. We closed the enterotomy with a TA 60 stapler. A reinforcing suture of 3-0 silk was placed at the crotch of the anastomosis. We oversewed the TA 60 staple line with 3-0 silk. The mesenteric defect was closed with a 2-0 silk. The anastomosis was palpated and was widely patent. We then ran the remainder of the small bowel and no other injuries were noted. There were some inflammatory adhesions but the small bowel appeared to be intact. We then thoroughly irrigated the abdomen with warm saline. Hemostasis was good. No further purulence was noted. We reapproximated the fascia with double-stranded #1 PDS suture. The subcutaneous tissues were irrigated. Staples were used to close the skin. A sterile dressing was applied.  The patient was brought to the intensive care unit while still intubated. All sponge, instrument, and needle counts are correct.  Wilmon Arms. Corliss Skains, MD, [facility] Surgery  10/26/2011 11:08 PM

## 2011-10-26 NOTE — ED Notes (Signed)
Per patients  Husband ptt has a bruise on the rt upper part of head due to a fall 3 days ago

## 2011-10-26 NOTE — ED Notes (Addendum)
Patient is resting comfortably. 

## 2011-10-26 NOTE — OR Nursing (Signed)
Procedure timeout time edited from 0951 to 2151 when it was actually performed.

## 2011-10-26 NOTE — ED Notes (Signed)
Pt is currently in Radiology  

## 2011-10-26 NOTE — H&P (Signed)
Connie Fitzpatrick is an 78 y.o. female.   Chief Complaint: Abdominal pain HPI: 78 yo female with dementia and significant other medical problems presents emergently to the ED with acute onset of severe abdominal pain.  She apparently had a bowel movement earlier today - non-bloody.  The pain seems to be worse in the RLQ, but she is diffusely guarding.  Hemodynamically stable.  CT scan shows free air with significant stranding in the small bowel mesentery.  Past Medical History  Diagnosis Date  . Gout   . INSOMNIA   . ALLERGIC RHINITIS   . ASTHMA   . OSTEOARTHRITIS   . Rheumatoid arthritis   . HYPERTENSION   . HYPERLIPIDEMIA   . GERD (gastroesophageal reflux disease)   . Depression   . ALCOHOL ABUSE     hx detox, dry since 2010  . HYPOTHYROIDISM   . URINARY INCONTINENCE     Past Surgical History  Procedure Date  . Abdominal hysterectomy   . Breast surgery   . Total knee arthroplasty   . Vaginal prolapse repair   . Total knee arthroplasty 07/2002    right  . Total knee arthroplasty 09/1998    Left  . Cataract extraction 2001  . L-spine 1998  . Colpocleisis vaginal [place] 06/2010    Family History  Problem Relation Age of Onset  . Arthritis Mother   . Diabetes Mother   . Hypertension Mother   . Lung cancer Father   . Stroke Father    Social History:  reports that she has never smoked. She does not have any smokeless tobacco history on file. She reports that she does not drink alcohol or use illicit drugs.  Allergies:  Allergies  Allergen Reactions  . Allopurinol     REACTION: Skin rash  . Azithromycin   . Enalapril Maleate     REACTION: swelling of mouth \\T \ throat  . Morphine And Related Other (See Comments)    Possible hallucinations ( 07/12)  . Nsaids     Ulcer on EGD 01/01/10 - stopped celebrex  . Valacyclovir Hcl    Meds: See Med rec for extensive list   (Not in a hospital admission)  Results for orders placed during the hospital encounter of 10/26/11 (from  the past 48 hour(s))  TYPE AND SCREEN     Status: Normal   Collection Time   10/26/11  4:12 PM      Component Value Range Comment   ABO/RH(D) A POS      Antibody Screen NEG      Sample Expiration 10/29/2011     LACTIC ACID, PLASMA     Status: Normal   Collection Time   10/26/11  4:16 PM      Component Value Range Comment   Lactic Acid, Venous 1.5  0.5 - 2.2 (mmol/L)   COMPREHENSIVE METABOLIC PANEL     Status: Abnormal   Collection Time   10/26/11  4:17 PM      Component Value Range Comment   Sodium 137  135 - 145 (mEq/L)    Potassium 3.9  3.5 - 5.1 (mEq/L)    Chloride 97  96 - 112 (mEq/L)    CO2 28  19 - 32 (mEq/L)    Glucose, Bld 126 (*) 70 - 99 (mg/dL)    BUN 24 (*) 6 - 23 (mg/dL)    Creatinine, Ser 1.61 (*) 0.50 - 1.10 (mg/dL)    Calcium 09.6  8.4 - 10.5 (mg/dL)    Total  Protein 6.5  6.0 - 8.3 (g/dL)    Albumin 3.1 (*) 3.5 - 5.2 (g/dL)    AST 18  0 - 37 (U/L)    ALT 9  0 - 35 (U/L)    Alkaline Phosphatase 72  39 - 117 (U/L)    Total Bilirubin 0.3  0.3 - 1.2 (mg/dL)    GFR calc non Af Amer 43 (*) >90 (mL/min)    GFR calc Af Amer 50 (*) >90 (mL/min)   LIPASE, BLOOD     Status: Normal   Collection Time   10/26/11  4:17 PM      Component Value Range Comment   Lipase 18  11 - 59 (U/L)   CBC     Status: Abnormal   Collection Time   10/26/11  4:17 PM      Component Value Range Comment   WBC 10.2  4.0 - 10.5 (K/uL)    RBC 4.00  3.87 - 5.11 (MIL/uL)    Hemoglobin 12.3  12.0 - 15.0 (g/dL)    HCT 78.2  95.6 - 21.3 (%)    MCV 97.0  78.0 - 100.0 (fL)    MCH 30.8  26.0 - 34.0 (pg)    MCHC 31.7  30.0 - 36.0 (g/dL)    RDW 08.6  57.8 - 46.9 (%)    Platelets 418 (*) 150 - 400 (K/uL)   DIFFERENTIAL     Status: Normal   Collection Time   10/26/11  4:17 PM      Component Value Range Comment   Neutrophils Relative 68  43 - 77 (%)    Neutro Abs 6.9  1.7 - 7.7 (K/uL)    Lymphocytes Relative 26  12 - 46 (%)    Lymphs Abs 2.6  0.7 - 4.0 (K/uL)    Monocytes Relative 3  3 - 12 (%)     Monocytes Absolute 0.3  0.1 - 1.0 (K/uL)    Eosinophils Relative 2  0 - 5 (%)    Eosinophils Absolute 0.2  0.0 - 0.7 (K/uL)    Basophils Relative 1  0 - 1 (%)    Basophils Absolute 0.1  0.0 - 0.1 (K/uL)   PROTIME-INR     Status: Normal   Collection Time   10/26/11  4:17 PM      Component Value Range Comment   Prothrombin Time 13.6  11.6 - 15.2 (seconds)    INR 1.02  0.00 - 1.49    POCT I-STAT 3, BLOOD GAS (G3P V)     Status: Abnormal   Collection Time   10/26/11  4:35 PM      Component Value Range Comment   pH, Ven 7.355 (*) 7.250 - 7.300     pCO2, Ven 56.2 (*) 45.0 - 50.0 (mmHg)    pO2, Ven 27.0 (*) 30.0 - 45.0 (mmHg)    Bicarbonate 31.4 (*) 20.0 - 24.0 (mEq/L)    TCO2 33  0 - 100 (mmol/L)    O2 Saturation 46.0      Acid-Base Excess 5.0 (*) 0.0 - 2.0 (mmol/L)    Sample type VENOUS      Comment NOTIFIED PHYSICIAN      Ct Abdomen Pelvis W Contrast  10/26/2011  *RADIOLOGY REPORT*  Clinical Data: Right lower quadrant pain.  Distension.  History of hysterectomy and surgery for vaginal prolapse.  Pain is worse with movement.  CT ABDOMEN AND PELVIS WITH CONTRAST  Technique:  Multidetector CT imaging of the abdomen and pelvis was  performed following the standard protocol during bolus administration of intravenous contrast.  Contrast: 80mL OMNIPAQUE IOHEXOL 300 MG/ML  SOLN  Comparison: CT of the abdomen pelvis 12/02/2010  Findings: The heart is enlarged.  There are coronary artery calcifications.  There is bibasilar atelectasis/scarring.  Within the lower central mesentery, there is stranding.  Small locules of gas are identified adjacent to several distal ileal loops.  Findings are consistent with bowel perforation, best localized to the distal ileum.  There are mildly dilated loops of ileum which to contain contrast.  No evidence for bowel obstruction.  Measures 8 mm and appears irregular, also surrounded by inflammatory change within the right lower quadrant.  Findings are consistent with a  secondary inflammation or appendicitis secondarily involving the mesentery and bowel loops.  No focal abnormality identified within the liver, spleen.  Small low attenuation lesion is identified along the posterior aspect of the pancreas.  This may represent a pseudocyst or other cystic lesion.  This can be further characterized with MRI using pancreatic protocol.  No focal abnormality identified within the kidneys.  Gallbladder is present.  The uterus is surgically absent.  There is a moderate rectocele. Multiple colonic diverticula are present.  Right lower quadrant/adnexal low attenuation lesion appears stable since prior study, 4.1 x 3.6 cm.  There are moderate degenerative changes throughout the lower thoracic and lumbar spine.  There is moderate scoliosis of the spine.  IMPRESSION:  1.  Significant inflammatory changes involving the mid right lower quadrant mesentery.  Locules of gas within the mesentery are consistent with bowel perforation. 2.  The appendix is thickened and irregular, consistent with inflamed appendix. It is difficult to know whether this is a primary secondary process. 3.  Cardiomegaly and coronary calcifications. 4.  Small low attenuation lesion within the dorsum of the pancreas. This can be further characterized using MRI with contrast and pancreatic protocol.  The findings were discussed with Dr.Kohut on 10/26/2011 at 7:45 p.m.  Original Report Authenticated By: Patterson Hammersmith, M.D.   Dg Chest Portable 1 View  10/26/2011  *RADIOLOGY REPORT*  Clinical Data: Abdominal pain.  Hypertension.  PORTABLE CHEST - 1 VIEW  Comparison: 08/16/2010  Findings: Patient is partially rotated to the left.  Mild cardiomegaly stable.  Both lungs are clear.  No evidence of free air beneath the diaphragms.  Chronic end-stage arthritis and dislocations of both shoulder joints again seen.  IMPRESSION: Mild cardiomegaly.  No active lung disease or other acute findings.  Original Report Authenticated By:  Danae Orleans, M.D.    ROS  Blood pressure 132/68, pulse 87, temperature 98.1 F (36.7 C), temperature source Oral, resp. rate 18, SpO2 93.00%. Physical Exam  Elderly female in obvious distress  Disoriented, mildly combative Does not follow commands Abd - distended; diffusely tender; mostly in lower abdomen  Assessment/Plan Free air - perforated viscus - likely small bowel Significant medical comorbidities  Will proceed directly to OR for exploratory laparotomy and bowel resection CCM will help manage patient post-op The surgical procedure has been discussed with the patient.  Potential risks, benefits, alternative treatments, and expected outcomes have been explained.  All of the patient's family's questions at this time have been answered.  The family understands the proposed surgical procedure and wishes to proceed.   Timothy Trudell K. 10/26/2011, 8:27 PM

## 2011-10-26 NOTE — ED Notes (Signed)
Tii

## 2011-10-26 NOTE — ED Notes (Signed)
Pt is back in room from Radiology. 

## 2011-10-26 NOTE — Anesthesia Postprocedure Evaluation (Signed)
  Anesthesia Post-op Note  Patient: Connie Fitzpatrick  Procedure(s) Performed: Procedure(s) (LRB): EXPLORATORY LAPAROTOMY (N/A) SMALL BOWEL RESECTION (N/A)  Patient Location: Neuro ICU Anesthesia Type: General  Level of Consciousness: sedated and Patient remains intubated per anesthesia plan  Airway and Oxygen Therapy: Patient remains intubated per anesthesia plan and Patient placed on Ventilator (see vital sign flow sheet for setting)  Post-op Pain: none  Post-op Assessment: Post-op Vital signs reviewed, Patient's Cardiovascular Status Stable, Respiratory Function Stable, Patent Airway, No signs of Nausea or vomiting and Pain level controlled  Post-op Vital Signs: Reviewed and stable  Complications: No apparent anesthesia complications

## 2011-10-26 NOTE — Anesthesia Procedure Notes (Signed)
Procedure Name: Intubation Date/Time: 10/26/2011 9:42 PM Performed by: Ellin Goodie Pre-anesthesia Checklist: Patient identified, Emergency Drugs available, Suction available, Patient being monitored and Timeout performed Patient Re-evaluated:Patient Re-evaluated prior to inductionOxygen Delivery Method: Circle system utilized Preoxygenation: Pre-oxygenation with 100% oxygen Intubation Type: IV induction Ventilation: Mask ventilation without difficulty Laryngoscope Size: Mac and 3 Grade View: Grade I Tube type: Oral Tube size: 7.5 mm Number of attempts: 1 Airway Equipment and Method: Stylet Placement Confirmation: ETT inserted through vocal cords under direct vision Secured at: 22 cm Tube secured with: Tape Dental Injury: Teeth and Oropharynx as per pre-operative assessment

## 2011-10-26 NOTE — ED Notes (Signed)
Family at bedside. 

## 2011-10-26 NOTE — ED Provider Notes (Addendum)
History    83yf with abdominal pain. RLQ. Pt has been having intermittent RLQ pain for past few days. Acutely worse around 1230-1300 today. Persistent since. Worse with movement. Feels bloated. No n/v/d. No urinary complaints. BM today. No blood or melena. No fever or chills. Surgical hx significant for hysterectomy and procedure for what sounds like vaginal prolapse done at [facility] 2y ago. Denies recent procedures. Pt with multiple recent falls with last one 3d ago. Has struck head but no LOC, denies HA/neck/back pain and not on blood thinners.  CSN: [id number]  Arrival date & time 10/26/11  1522   First MD Initiated Contact with Patient 10/26/11 1527      Chief Complaint  Patient presents with  . Abdominal Pain    (Consider location/radiation/quality/duration/timing/severity/associated sxs/prior treatment) HPI  Past Medical History  Diagnosis Date  . Gout   . INSOMNIA   . ALLERGIC RHINITIS   . ASTHMA   . OSTEOARTHRITIS   . Rheumatoid arthritis   . HYPERTENSION   . HYPERLIPIDEMIA   . GERD (gastroesophageal reflux disease)   . Depression   . ALCOHOL ABUSE     hx detox, dry since 2010  . HYPOTHYROIDISM   . URINARY INCONTINENCE     Past Surgical History  Procedure Date  . Abdominal hysterectomy   . Breast surgery   . Total knee arthroplasty   . Vaginal prolapse repair   . Total knee arthroplasty 07/2002    right  . Total knee arthroplasty 09/1998    Left  . Cataract extraction 2001  . L-spine 1998  . Colpocleisis vaginal [place] 06/2010    Family History  Problem Relation Age of Onset  . Arthritis Mother   . Diabetes Mother   . Hypertension Mother   . Lung cancer Father   . Stroke Father     History  Substance Use Topics  . Smoking status: Never Smoker   . 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 Alcohol Use: No     HX alcohol abuse    OB History    Grav Para Term Preterm Abortions TAB SAB Ect Mult Living                    Review of Systems   Review of symptoms negative unless otherwise noted in HPI.   Allergies  Allopurinol; Azithromycin; Enalapril maleate; Nsaids; and Valacyclovir hcl  Home Medications   Current Outpatient Rx  Name Route Sig Dispense Refill  . ACETAMINOPHEN 500 MG PO TABS Oral Take 500 mg by mouth as needed.      . ALBUTEROL SULFATE HFA 108 (90 BASE) MCG/ACT IN AERS Inhalation Inhale 2 puffs into the lungs as needed.      . ALPRAZOLAM 0.5 MG PO TABS Oral Take 1 tablet (0.5 mg total) by mouth 3 (three) times daily. Take 1 in the morning and 2 in the evening 90 tablet 5  . AZELASTINE HCL 137 MCG/SPRAY NA SOLN Nasal Place 2 sprays into the nose 2 (two) times daily. Use in each nostril as directed 30 mL 2  . BIOTIN 300 MCG PO TABS Oral Take by mouth at bedtime.      Marland Kitchen CLARITIN-D 12 HOUR 5-120 MG PO TB12  TAKE 1 TABLET BY MOUTH TWICE A DAY 60 tablet 5  . COLCRYS 0.6 MG PO TABS  TAKE 1 TABLET BY MOUTH DAILY. 30 tablet 6  . CYMBALTA 60 MG PO  CPEP  TAKE 1 CAPSULE BY MOUTH AT BEDTIME. 30 capsule 5  . DILTIAZEM HCL ER COATED BEADS 180 MG PO CP24  TAKE 1 CAPSULE BY MOUTH EVERY DAY 30 capsule 5  . DONEPEZIL HCL 10 MG PO TABS  TAEK 1 TABLET BY MOUTH EVERY DAY IN THE EVENING 30 tablet 5  . FUROSEMIDE 40 MG PO TABS Oral Take 1 tablet (40 mg total) by mouth daily. 30 tablet 3  . GABAPENTIN 300 MG PO CAPS  TAKE ONE CAPSULE BY MOUTH TWICE A DAY 60 capsule 5  . HYDROCODONE-ACETAMINOPHEN 10-325 MG PO TABS Oral Take 2 tablets by mouth 2 (two) times daily. If needed - Must last 30 days 120 tablet 3  . IPRATROPIUM BROMIDE 0.06 % NA SOLN Nasal 2 sprays by Nasal route as needed.      Marland Kitchen KLOR-CON M10 10 MEQ PO TBCR  TAKE 1 TABLET BY MOUTH EVERY DAY 30 tablet 6  . MELATONIN 5 MG PO CAPS Oral Take by mouth at bedtime.      Marland Kitchen METOPROLOL SUCCINATE ER 100 MG PO TB24  TAKE 1 TABLET BY MOUTH AT BEDTIME. 30 tablet 5  . SUPER HIGH VITAMINS/MINERALS PO TABS Oral Take 1 tablet by mouth daily.      Marland Kitchen  PANTOPRAZOLE SODIUM 40 MG PO TBEC Oral Take 1 tablet (40 mg total) by mouth daily. 30 tablet 6  . POTASSIUM CHLORIDE CRYS ER 10 MEQ PO TBCR Oral Take 1 tablet (10 mEq total) by mouth daily. 30 tablet 6  . PREDNISOLONE ACETATE 1 % OP SUSP  1 drop as needed.      Marland Kitchen PREDNISONE 5 MG PO TABS Oral Take 1 tablet (5 mg total) by mouth daily. 30 tablet 6  . SELENIUM 100 MCG PO CAPS Oral Take by mouth at bedtime.      Marland Kitchen SYNTHROID 100 MCG PO TABS Oral Take 1 tablet (100 mcg total) by mouth daily. 30 tablet 11    Dispense as written.  . THEOPHYLLINE ER 100 MG PO TB12  TAKE 1 TABLET BY MOUTH EVERY DAY AS NEEDED 90 tablet 1  . THEOPHYLLINE 200 MG PO TB12 Oral Take 1 tablet (200 mg total) by mouth at bedtime. 30 tablet 5  . TRAMADOL HCL 50 MG PO TABS Oral Take 1 tablet (50 mg total) by mouth every 6 (six) hours as needed for pain. 120 tablet 6  . ULORIC 40 MG PO TABS  TAKE 1 TABLET BY MOUTH EVERY MORNING 30 tablet 6  . VITAMIN D (ERGOCALCIFEROL) 50000 UNITS PO CAPS  TAKE ONE CAPSULE BY MOUTH EVERY MONTH AS DIRECTED 12 capsule 0  . VITAMIN E 400 UNITS PO CAPS Oral Take 400 Units by mouth daily.        BP 116/52  Pulse 77  Temp(Src) 97.6 F (36.4 C) (Oral)  Resp 21  SpO2 93%  Physical Exam  Nursing note and vitals reviewed. Constitutional: She appears distressed.       Laying in bed. Uncomfortable appearing.  HENT:  Head: Normocephalic.       Ecchymosis to R forehead without singificant underlying bony tenderness.  Eyes: Pupils are equal, round, and reactive to light. Right eye exhibits no discharge. Left eye exhibits no discharge.       Pale conjunctiva  Neck: Normal range of motion. Neck supple.  Cardiovascular: Normal rate, regular rhythm and normal heart sounds.  Exam reveals no gallop and no friction rub.   No murmur heard. Pulmonary/Chest: Effort normal and breath  sounds normal. No respiratory distress.  Abdominal: Soft. She exhibits distension. There is tenderness. There is guarding. There  is no rebound.       Abdomen distended. Not tympany. Diffuse tenderness with voluntary guarding. No rebound.  Musculoskeletal:       Scattered bruising to b/l LE extremities and R knee bandaged.  Neurological: She is alert. She exhibits normal muscle tone.       Speech clear and content appropriate.  Skin: Skin is warm and dry.  Psychiatric: Her behavior is normal. Thought content normal.    ED Course  Procedures (including critical care time)  CRITICAL CARE Performed by: Raeford Razor   Total critical care time: 35 minutes  Critical care time was exclusive of separately billable procedures and treating other patients.  Critical care was necessary to treat or prevent imminent or life-threatening deterioration.  Critical care was time spent personally by me on the following activities: development of treatment plan with patient and/or surrogate as well as nursing, discussions with consultants, evaluation of patient's response to treatment, examination of patient, obtaining history from patient or surrogate, ordering and performing treatments and interventions, ordering and review of laboratory studies, ordering and review of radiographic studies, pulse oximetry and re-evaluation of patient's condition.  Labs Reviewed  COMPREHENSIVE METABOLIC PANEL - Abnormal; Notable for the following:    Glucose, Bld 126 (*)    BUN 24 (*)    Creatinine, Ser 1.16 (*)    Albumin 3.1 (*)    GFR calc non Af Amer 43 (*)    GFR calc Af Amer 50 (*)    All other components within normal limits  CBC - Abnormal; Notable for the following:    Platelets 418 (*)    All other components within normal limits  POCT I-STAT 3, BLOOD GAS (G3P V) - Abnormal; Notable for the following:    pH, Ven 7.355 (*)    pCO2, Ven 56.2 (*)    pO2, Ven 27.0 (*)    Bicarbonate 31.4 (*)    Acid-Base Excess 5.0 (*)    All other components within normal limits  LACTIC ACID, PLASMA  TYPE AND SCREEN  LIPASE, BLOOD    DIFFERENTIAL  PROTIME-INR  URINALYSIS, ROUTINE W REFLEX MICROSCOPIC   Ct Abdomen Pelvis W Contrast  10/26/2011  *RADIOLOGY REPORT*  Clinical Data: Right lower quadrant pain.  Distension.  History of hysterectomy and surgery for vaginal prolapse.  Pain is worse with movement.  CT ABDOMEN AND PELVIS WITH CONTRAST  Technique:  Multidetector CT imaging of the abdomen and pelvis was performed following the standard protocol during bolus administration of intravenous contrast.  Contrast: 80mL OMNIPAQUE IOHEXOL 300 MG/ML  SOLN  Comparison: CT of the abdomen pelvis 12/02/2010  Findings: The heart is enlarged.  There are coronary artery calcifications.  There is bibasilar atelectasis/scarring.  Within the lower central mesentery, there is stranding.  Small locules of gas are identified adjacent to several distal ileal loops.  Findings are consistent with bowel perforation, best localized to the distal ileum.  There are mildly dilated loops of ileum which to contain contrast.  No evidence for bowel obstruction.  Measures 8 mm and appears irregular, also surrounded by inflammatory change within the right lower quadrant.  Findings are consistent with a secondary inflammation or appendicitis secondarily involving the mesentery and bowel loops.  No focal abnormality identified within the liver, spleen.  Small low attenuation lesion is identified along the posterior aspect of the pancreas.  This may represent a  pseudocyst or other cystic lesion.  This can be further characterized with MRI using pancreatic protocol.  No focal abnormality identified within the kidneys.  Gallbladder is present.  The uterus is surgically absent.  There is a moderate rectocele. Multiple colonic diverticula are present.  Right lower quadrant/adnexal low attenuation lesion appears stable since prior study, 4.1 x 3.6 cm.  There are moderate degenerative changes throughout the lower thoracic and lumbar spine.  There is moderate scoliosis of the  spine.  IMPRESSION:  1.  Significant inflammatory changes involving the mid right lower quadrant mesentery.  Locules of gas within the mesentery are consistent with bowel perforation. 2.  The appendix is thickened and irregular, consistent with inflamed appendix. It is difficult to know whether this is a primary secondary process. 3.  Cardiomegaly and coronary calcifications. 4.  Small low attenuation lesion within the dorsum of the pancreas. This can be further characterized using MRI with contrast and pancreatic protocol.  The findings were discussed with Dr.Kaiyon Hynes on 10/26/2011 at 7:45 p.m.  Original Report Authenticated By: Patterson Hammersmith, M.D.   Dg Chest Portable 1 View  10/26/2011  *RADIOLOGY REPORT*  Clinical Data: Abdominal pain.  Hypertension.  PORTABLE CHEST - 1 VIEW  Comparison: 08/16/2010  Findings: Patient is partially rotated to the left.  Mild cardiomegaly stable.  Both lungs are clear.  No evidence of free air beneath the diaphragms.  Chronic end-stage arthritis and dislocations of both shoulder joints again seen.  IMPRESSION: Mild cardiomegaly.  No active lung disease or other acute findings.  Original Report Authenticated By: Danae Orleans, M.D.     1. Perforated viscus       MDM  4:05 PM 83yF with abdominal pain. Abdominal exam concerning but not peritoneal. XR to eval for free air then fu with CT. Labs, IVF, NPO, pain meds.   4:13 PM Review of records significant for similar symptoms in 11/2010 without clear etiology despite fairly comprehensive w/u.  5:23 PM Pt reassessed. No new complaints. Reports pain improved. Declining additional pain meds at this time.  7:48 PM Called by radiology. Perfd viscus. Abx. Surgery paged. Pt and family informed or diagnosis and plan.  7:53 PM Discussed with Tsuei. Will eval pt.    Raeford Razor, MD 10/26/11 2001  Raeford Razor, MD 10/26/11 [phone]

## 2011-10-26 NOTE — ED Notes (Signed)
Paged Radiology to let them know that the patient is ready for transport

## 2011-10-26 NOTE — Anesthesia Preprocedure Evaluation (Addendum)
Anesthesia Evaluation  Patient identified by MRN, date of birth, ID band Patient awake    Reviewed: Allergy & Precautions, H&P , NPO status , Patient's Chart, lab work & pertinent test results  Airway Mallampati: II TM Distance: <3 FB     Dental  (+) Teeth Intact   Pulmonary asthma ,  breath sounds clear to auscultation        Cardiovascular hypertension, Pt. on medications Rhythm:Regular Rate:Normal     Neuro/Psych    GI/Hepatic GERD-  ,  Endo/Other  Hypothyroidism   Renal/GU      Musculoskeletal  (+) Arthritis -,   Abdominal (+)  Abdomen: rigid. Bowel sounds: normal.  Peds  Hematology   Anesthesia Other Findings Unable to give dental advisory as pt is too demented to understand.  Reproductive/Obstetrics                        Anesthesia Physical Anesthesia Plan  ASA: III  Anesthesia Plan: General   Post-op Pain Management:    Induction: Intravenous  Airway Management Planned: Oral ETT  Additional Equipment:   Intra-op Plan:   Post-operative Plan: Possible Post-op intubation/ventilation  Informed Consent: I have reviewed the patients History and Physical, chart, labs and discussed the procedure including the risks, benefits and alternatives for the proposed anesthesia with the patient or authorized representative who has indicated his/her understanding and acceptance.   Dental advisory given  Plan Discussed with: CRNA, Anesthesiologist and Surgeon  Anesthesia Plan Comments:         Anesthesia Quick Evaluation

## 2011-10-26 NOTE — Consult Note (Signed)
Name: Connie Fitzpatrick MRN: [id number] DOB: [birth year]    LOS: 0  Referring Provider:  Corliss Skains Reason for Referral:  Postop respiratory failure  PULMONARY / CRITICAL CARE MEDICINE  The patient is encephalopathic and unable to provide history, which was obtained for available medical records.  HPI:  78 yo with dementia and multiple comorbidities admitted on 5/27 for small bowel perforation secondary to swallowed foreign body who underwent exploratory laparotomy / small bowel resection and was brought to ICU intubated.   Past Medical History  Diagnosis Date  . Gout   . INSOMNIA   . ALLERGIC RHINITIS   . ASTHMA   . OSTEOARTHRITIS   . Rheumatoid arthritis   . HYPERTENSION   . HYPERLIPIDEMIA   . GERD (gastroesophageal reflux disease)   . Depression   . ALCOHOL ABUSE     hx detox, dry since 2010  . HYPOTHYROIDISM   . URINARY INCONTINENCE    Past Surgical History  Procedure Date  . Abdominal hysterectomy   . Breast surgery   . Total knee arthroplasty   . Vaginal prolapse repair   . Total knee arthroplasty 07/2002    right  . Total knee arthroplasty 09/1998    Left  . Cataract extraction 2001  . L-spine 1998  . Colpocleisis vaginal [place] 06/2010   Prior to Admission medications   Medication Sig Start Date End Date Taking? Authorizing Provider  acetaminophen (TYLENOL) 500 MG tablet Take 500 mg by mouth every 6 (six) hours as needed. For pain/fever   Yes Historical Provider, MD  albuterol (PROVENTIL HFA) 108 (90 BASE) MCG/ACT inhaler Inhale 2 puffs into the lungs every 4 (four) hours as needed. For shortness of breath   Yes Historical Provider, MD  ALPRAZolam (XANAX) 0.5 MG tablet Take 0.5-1 mg by mouth 2 (two) times daily. Take 1 in the morning and 2 in the evening 07/27/11  Yes Newt Lukes, MD  Biotin 300 MCG TABS Take 300 mcg by mouth at bedtime.    Yes Historical Provider, MD  bisacodyl (DULCOLAX) 5 MG EC tablet Take 5 mg by mouth at bedtime.   Yes Historical Provider, MD    Calcium-Vitamin D (CALTRATE 600 PLUS-VIT D PO) Take 1 tablet by mouth every evening.   Yes Historical Provider, MD  Coenzyme Q10 (COQ10) 100 MG CAPS Take 100 mg by mouth every evening.   Yes Historical Provider, MD  colchicine 0.6 MG tablet Take 0.6 mg by mouth daily with breakfast.   Yes Historical Provider, MD  cyanocobalamin 1000 MCG tablet Take 100 mcg by mouth every evening.   Yes Historical Provider, MD  diltiazem (CARDIZEM CD) 180 MG 24 hr capsule Take 180 mg by mouth daily with breakfast.   Yes Historical Provider, MD  docusate sodium (COLACE) 100 MG capsule Take 200 mg by mouth at bedtime as needed. For constipation   Yes Historical Provider, MD  donepezil (ARICEPT) 10 MG tablet Take 10 mg by mouth daily with breakfast.   Yes Historical Provider, MD  DULoxetine (CYMBALTA) 60 MG capsule Take 60 mg by mouth at bedtime.   Yes Historical Provider, MD  febuxostat (ULORIC) 40 MG tablet Take 40 mg by mouth daily with breakfast.   Yes Historical Provider, MD  folic acid (FOLVITE) 1 MG tablet Take 1 mg by mouth daily with breakfast.   Yes Historical Provider, MD  furosemide (LASIX) 40 MG tablet Take 20 mg by mouth daily. 05/20/11  Yes Newt Lukes, MD  gabapentin (NEURONTIN) 300 MG capsule  Take 300 mg by mouth 2 (two) times daily.   Yes Historical Provider, MD  Glucosamine-Chondroitin (JOINT SUPPORT PO) Take 2 tablets by mouth every evening.   Yes Historical Provider, MD  [place] (VENASTAT) 300 MG CPCR Take 300 mg by mouth every evening.   Yes Historical Provider, MD  HYDROcodone-acetaminophen (NORCO) 10-325 MG per tablet Take 2 tablets by mouth 2 (two) times daily as needed. For pain 05/25/11  Yes Newt Lukes, MD  ipratropium (ATROVENT) 0.06 % nasal spray Place 2 sprays into the nose as needed. For rhinorrhea   Yes Historical Provider, MD  levothyroxine (SYNTHROID, LEVOTHROID) 100 MCG tablet Take 100 mcg by mouth daily with breakfast.   Yes Historical Provider, MD   loratadine-pseudoephedrine (CLARITIN-D 12-HOUR) 5-120 MG per tablet Take 1 tablet by mouth 2 (two) times daily.   Yes Historical Provider, MD  Melatonin 5 MG CAPS Take 5 mg by mouth at bedtime.    Yes Historical Provider, MD  methotrexate (RHEUMATREX) 2.5 MG tablet Take 10 mg by mouth once a week. Caution:Chemotherapy. Protect from light.   Yes Historical Provider, MD  metoprolol succinate (TOPROL-XL) 100 MG 24 hr tablet Take 100 mg by mouth at bedtime. Take with or immediately following a meal.   Yes Historical Provider, MD  Multiple Vitamin (MULITIVITAMIN WITH MINERALS) TABS Take 1 tablet by mouth at bedtime.   Yes Historical Provider, MD  multivitamin-lutein (OCUVITE-LUTEIN) CAPS Take 1 capsule by mouth daily with breakfast.   Yes Historical Provider, MD  Omega-3 Fatty Acids 300 MG CAPS Take 2 capsules by mouth every evening.   Yes Historical Provider, MD  potassium chloride (K-DUR) 10 MEQ tablet Take 5 mEq by mouth daily with breakfast.   Yes Historical Provider, MD  prednisoLONE acetate (PRED FORTE) 1 % ophthalmic suspension Place 1 drop into both eyes 2 (two) times daily as needed. For eye allergies   Yes Historical Provider, MD  predniSONE (DELTASONE) 5 MG tablet Take 5 mg by mouth daily. 09/24/11  Yes Newt Lukes, MD  ranitidine (ZANTAC) 150 MG tablet Take 150 mg by mouth every morning.   Yes Historical Provider, MD  Selenium 100 MCG CAPS Take 100 mcg by mouth at bedtime.    Yes Historical Provider, MD  theophylline (THEODUR) 100 MG 12 hr tablet Take 100 mg by mouth every morning.   Yes Historical Provider, MD  theophylline (THEODUR) 200 MG 12 hr tablet Take 200 mg by mouth at bedtime. 05/20/11  Yes Newt Lukes, MD  thiamine (VITAMIN B-1) 100 MG tablet Take 100 mg by mouth every evening.   Yes Historical Provider, MD  traMADol (ULTRAM) 50 MG tablet Take 50 mg by mouth 3 (three) times daily. 02/25/11  Yes Newt Lukes, MD  Vitamin D, Ergocalciferol, (DRISDOL) 50000 UNITS  CAPS Take 50,000 Units by mouth every 30 (thirty) days.   Yes Historical Provider, MD  vitamin E 400 UNIT capsule Take 400 Units by mouth every evening.    Yes Historical Provider, MD   Allergies Allergies  Allergen Reactions  . Allopurinol     REACTION: Skin rash  . Azithromycin   . Enalapril Maleate     REACTION: swelling of mouth \\T \ throat  . Morphine And Related Other (See Comments)    Possible hallucinations ( 07/12)  . Nsaids     Ulcer on EGD 01/01/10 - stopped celebrex  . Valacyclovir Hcl    Family History Family History  Problem Relation Age of Onset  . Arthritis Mother   .  Diabetes Mother   . Hypertension Mother   . Lung cancer Father   . Stroke Father    Social History  reports that she has never smoked. She does not have any smokeless tobacco history on file. She reports that she does not drink alcohol or use illicit drugs.  Review Of Systems:  Unable to provide  Brief patient description:  78 yo with dementia and multiple comorbidities admitted on 5/27 for small bowel perforation secondary to swallowed foreign body who underwent exploratory laparotomy / small bowel resection and was brought to ICU intubated.   Events Since Admission: 5/27  Admitted for perforated viscus, exploratory lap with bowel resection, intubated postoperatively.   Current Status:  Vital Signs: Temp:  [97.6 F (36.4 C)-100 F (37.8 C)] 100 F (37.8 C) (05/27 2034) Pulse Rate:  [77-99] 96  (05/27 2340) Resp:  [15-21] 15  (05/27 2034) BP: (116-132)/(52-90) 118/90 mmHg (05/27 2340) SpO2:  [93 %-98 %] 95 % (05/27 2034) FiO2 (%):  [100 %] 100 % (05/27 2340)  Physical Examination: General:  Sedated, synchronous Neuro:  Nonfocal, moves all extremities HEENT:  Dry membranes, NGT, ETT Neck:  Supple Cardiovascular:  RRR, no murmurs Lungs:  Bilateral diminished breath sounds, no wheezes Abdomen:  Bowel sounds absent, surgical dressing c/d/i Musculoskeletal:  Pedal edema Skin:  Surgical  dressing R knee  Active Problems:  Acute respiratory failure  Acute encephalopathy  Perforated viscus  Peritonitis (acute) generalized  Adrenal insufficiency, primary, iatrogenic  Acute renal failure   ASSESSMENT AND PLAN  PULMONARY  Lab 10/26/11 1635  PHART --  PCO2ART --  PO2ART --  HCO3 31.4*  O2SAT 46.0   Ventilator Settings: Vent Mode:  [-] PRVC FiO2 (%):  [100 %] 100 % Set Rate:  [14 bmp] 14 bmp Vt Set:  [400 mL] 400 mL PEEP:  [5 cmH20] 5 cmH20  CXR:  Pending ETT:  5/27 >>>  A:  Acute postop respiratory failure.  History of "Asthma" but no evidence of acute bronchospasm. P:   Full mechanical support CXR now ABG now Bronchodilators SBT in AM  CARDIOVASCULAR  Lab 10/26/11 1616  TROPONINI --  LATICACIDVEN 1.5  PROBNP --   ECG:  5/27 >>> Sinus tachycardia Lines: NA  A: Hemodynamically stable.  No evidence of ischemia / arrhythmia. P:  Telemetry  RENAL  Lab 10/26/11 1617  NA 137  K 3.9  CL 97  CO2 28  BUN 24*  CREATININE 1.16*  CALCIUM 10.2  MG --  PHOS --   Intake/Output      05/27 0701 - 05/28 0700   I.V. 1700   Total Intake 1700   Urine 375   Blood 75   Total Output 450   Net +1250        Foley:  5/37  >>>   A:  Acute renal failure / prerenal azotemia. P:   IVF NS 75 mL/h BMP in AM  GASTROINTESTINAL  Lab 10/26/11 1617  AST 18  ALT 9  ALKPHOS 72  BILITOT 0.3  PROT 6.5  ALBUMIN 3.1*    A:  Perforated viscus.  Bowel resection. S/p Exp Lap. P:   Per Surgery  HEMATOLOGIC  Lab 10/26/11 1617  HGB 12.3  HCT 38.8  PLT 418*  INR 1.02  APTT --   A:  Reative thrombocytosis. P:  CBC in AM  INFECTIOUS  Lab 10/26/11 1617  WBC 10.2  PROCALCITON --   Cultures: None Antibiotics: 5/27  Ertapenem >>>  A:  Suspected  peritonitis P:   As above  ENDOCRINE No results found for this basename: GLUCAP:5 in the last 168 hours A:  Hypothyroidism.  Suspected adrenal insufficiency (chronic Prednisone). P:     Hydrocortisone 50 mg IV q6h Levothyroxine 50 mcg IV daily SSI/CBG as on steroids  NEUROLOGIC  A:  Acute encephalopathy (anesthesia, sedation).  History of dementia.  Distant history of alcohol abuse. P:   Fentanyl gtt Ativan boluses Daily WUA  BEST PRACTICE / DISPOSITION - Level of Care:  ICU - Primary Service:  CCS - Consultants:  PCCM - Code Status:  Full - Diet:  NPO - DVT Px:  SCDs - GI Px:  Protonix - Skin Integrity:  Abrasion R knee - Social / Family:  Not available   The patient is critically ill with multiple organ systems failure and requires high complexity decision making for assessment and support, frequent evaluation and titration of therapies, application of advanced monitoring technologies and extensive interpretation of multiple databases. Critical Care Time devoted to patient care services described in this note is 45 minutes.   Lonia Farber, M.D. Pulmonary and Critical Care Medicine [facility] Pager: [phone]  10/26/2011, 11:54 PM

## 2011-10-26 NOTE — ED Notes (Signed)
Reassessed patients pain. Pt states pain is now at a 6

## 2011-10-26 NOTE — ED Notes (Signed)
Per EMS pt came in by ambulance, pt states she is having abdominal pain that started today @ 12:30p.m. Pt rates her pain on level 10, pt abdomen is soft, distended, pt states she had a BM to day. Pt states pain is worst with movement and pressure.

## 2011-10-26 NOTE — Transfer of Care (Signed)
Immediate Anesthesia Transfer of Care Note  Patient: Connie Fitzpatrick  Procedure(s) Performed: Procedure(s) (LRB): EXPLORATORY LAPAROTOMY (N/A) SMALL BOWEL RESECTION (N/A)  Patient Location: PACU and NICU  Anesthesia Type: General  Level of Consciousness: sedated  Airway & Oxygen Therapy: Patient remains intubated per anesthesia plan and Patient placed on Ventilator (see vital sign flow sheet for setting)  Post-op Assessment: Report given to PACU RN  Post vital signs: Reviewed and stable  Complications: No apparent anesthesia complications

## 2011-10-26 NOTE — ED Notes (Signed)
Pre-surgical EKG done.  EKG placed in patient chart

## 2011-10-27 ENCOUNTER — Encounter (HOSPITAL_COMMUNITY): Payer: Self-pay | Admitting: *Deleted

## 2011-10-27 ENCOUNTER — Inpatient Hospital Stay (HOSPITAL_COMMUNITY): Payer: Medicare Other

## 2011-10-27 DIAGNOSIS — G9340 Encephalopathy, unspecified: Secondary | ICD-10-CM

## 2011-10-27 DIAGNOSIS — N179 Acute kidney failure, unspecified: Secondary | ICD-10-CM

## 2011-10-27 DIAGNOSIS — J9600 Acute respiratory failure, unspecified whether with hypoxia or hypercapnia: Secondary | ICD-10-CM

## 2011-10-27 DIAGNOSIS — R198 Other specified symptoms and signs involving the digestive system and abdomen: Secondary | ICD-10-CM

## 2011-10-27 DIAGNOSIS — K650 Generalized (acute) peritonitis: Secondary | ICD-10-CM

## 2011-10-27 DIAGNOSIS — J45909 Unspecified asthma, uncomplicated: Secondary | ICD-10-CM

## 2011-10-27 DIAGNOSIS — IMO0001 Reserved for inherently not codable concepts without codable children: Secondary | ICD-10-CM

## 2011-10-27 LAB — BLOOD GAS, ARTERIAL
MECHVT: 400 mL
TCO2: 23.8 mmol/L (ref 0–100)
pCO2 arterial: 37.7 mmHg (ref 35.0–45.0)
pH, Arterial: 7.396 (ref 7.350–7.400)

## 2011-10-27 LAB — URINALYSIS, ROUTINE W REFLEX MICROSCOPIC
Bilirubin Urine: NEGATIVE
Ketones, ur: 15 mg/dL — AB
Nitrite: NEGATIVE
Protein, ur: 30 mg/dL — AB
Urobilinogen, UA: 0.2 mg/dL (ref 0.0–1.0)

## 2011-10-27 LAB — GLUCOSE, CAPILLARY
Glucose-Capillary: 77 mg/dL (ref 70–99)
Glucose-Capillary: 138 mg/dL — ABNORMAL HIGH (ref 70–99)
Glucose-Capillary: 169 mg/dL — ABNORMAL HIGH (ref 70–99)

## 2011-10-27 LAB — URINE MICROSCOPIC-ADD ON

## 2011-10-27 LAB — CBC
Hemoglobin: 12.1 g/dL (ref 12.0–15.0)
Hemoglobin: 12.3 g/dL (ref 12.0–15.0)
MCH: 31.3 pg (ref 26.0–34.0)
Platelets: 368 10*3/uL (ref 150–400)
Platelets: 284 10*3/uL (ref 150–400)
RBC: 3.86 MIL/uL — ABNORMAL LOW (ref 3.87–5.11)
RBC: 3.93 MIL/uL (ref 3.87–5.11)
WBC: 22.2 10*3/uL — ABNORMAL HIGH (ref 4.0–10.5)
WBC: 22 10*3/uL — ABNORMAL HIGH (ref 4.0–10.5)

## 2011-10-27 LAB — CREATININE, SERUM
Creatinine, Ser: 1.01 mg/dL (ref 0.50–1.10)
GFR calc Af Amer: 59 mL/min — ABNORMAL LOW (ref >90)

## 2011-10-27 LAB — BASIC METABOLIC PANEL
Calcium: 8.8 mg/dL (ref 8.4–10.5)
GFR calc Af Amer: 62 mL/min — ABNORMAL LOW (ref >90)
GFR calc non Af Amer: 53 mL/min — ABNORMAL LOW (ref >90)
Potassium: 4.3 mEq/L (ref 3.5–5.1)
Sodium: 133 mEq/L — ABNORMAL LOW (ref 135–145)

## 2011-10-27 MED ORDER — SODIUM CHLORIDE 0.9 % IV BOLUS (SEPSIS)
500.0000 mL | Freq: Once | INTRAVENOUS | Status: AC
  Administered 2011-10-27: 500 mL via INTRAVENOUS

## 2011-10-27 MED ORDER — SODIUM CHLORIDE 0.9 % IV SOLN
50.0000 ug/h | INTRAVENOUS | Status: DC
  Filled 2011-10-27: qty 50

## 2011-10-27 MED ORDER — MIDAZOLAM BOLUS VIA INFUSION
1.0000 mg | INTRAVENOUS | Status: DC | PRN
  Filled 2011-10-27: qty 2

## 2011-10-27 MED ORDER — SODIUM CHLORIDE 0.9 % IV SOLN
2.0000 mg/h | INTRAVENOUS | Status: DC
  Filled 2011-10-27: qty 10

## 2011-10-27 MED ORDER — LEVOTHYROXINE SODIUM 100 MCG IV SOLR
50.0000 ug | Freq: Every day | INTRAVENOUS | Status: DC
  Administered 2011-10-28 – 2011-10-30 (×3): 50 ug via INTRAVENOUS
  Filled 2011-10-27 (×5): qty 2.5

## 2011-10-27 MED ORDER — ONDANSETRON HCL 4 MG/2ML IJ SOLN
4.0000 mg | Freq: Four times a day (QID) | INTRAMUSCULAR | Status: DC | PRN
  Administered 2011-11-04: 4 mg via INTRAVENOUS
  Filled 2011-10-27: qty 2

## 2011-10-27 MED ORDER — ONDANSETRON HCL 4 MG PO TABS: 4.0000 mg | ORAL_TABLET | Freq: Four times a day (QID) | ORAL | Status: DC | PRN

## 2011-10-27 MED ORDER — PIPERACILLIN-TAZOBACTAM 3.375 G IVPB
3.3750 g | Freq: Three times a day (TID) | INTRAVENOUS | Status: AC
  Administered 2011-10-27 – 2011-11-01 (×15): 3.375 g via INTRAVENOUS
  Filled 2011-10-27 (×16): qty 50

## 2011-10-27 MED ORDER — IPRATROPIUM-ALBUTEROL 18-103 MCG/ACT IN AERO
6.0000 | INHALATION_SPRAY | RESPIRATORY_TRACT | Status: DC | PRN
  Filled 2011-10-27: qty 14.7

## 2011-10-27 MED ORDER — CHLORHEXIDINE GLUCONATE 0.12 % MT SOLN
15.0000 mL | Freq: Two times a day (BID) | OROMUCOSAL | Status: DC
  Administered 2011-10-27 – 2011-11-01 (×10): 15 mL via OROMUCOSAL
  Filled 2011-10-27 (×13): qty 15

## 2011-10-27 MED ORDER — ENOXAPARIN SODIUM 40 MG/0.4ML ~~LOC~~ SOLN
40.0000 mg | SUBCUTANEOUS | Status: DC
  Filled 2011-10-27: qty 0.4

## 2011-10-27 MED ORDER — HYDROCORTISONE SOD SUCCINATE 100 MG IJ SOLR
50.0000 mg | Freq: Four times a day (QID) | INTRAMUSCULAR | Status: DC
  Administered 2011-10-27 – 2011-10-28 (×6): 50 mg via INTRAVENOUS
  Filled 2011-10-27 (×10): qty 1

## 2011-10-27 MED ORDER — INSULIN ASPART 100 UNIT/ML ~~LOC~~ SOLN
0.0000 [IU] | SUBCUTANEOUS | Status: DC
  Administered 2011-10-27: 3 [IU] via SUBCUTANEOUS
  Administered 2011-10-27: 2 [IU] via SUBCUTANEOUS
  Administered 2011-10-27: via SUBCUTANEOUS
  Administered 2011-10-27 (×2): 3 [IU] via SUBCUTANEOUS
  Administered 2011-10-27 – 2011-10-29 (×7): 2 [IU] via SUBCUTANEOUS
  Administered 2011-10-29: 3 [IU] via SUBCUTANEOUS
  Administered 2011-10-29 – 2011-10-30 (×4): 2 [IU] via SUBCUTANEOUS
  Administered 2011-10-30 (×2): 3 [IU] via SUBCUTANEOUS
  Administered 2011-10-31: 17:00:00 via SUBCUTANEOUS
  Administered 2011-10-31: 4 [IU] via SUBCUTANEOUS
  Administered 2011-10-31: 2 [IU] via SUBCUTANEOUS
  Administered 2011-10-31: 3 [IU] via SUBCUTANEOUS
  Administered 2011-11-01 (×2): 2 [IU] via SUBCUTANEOUS

## 2011-10-27 MED ORDER — IPRATROPIUM-ALBUTEROL 18-103 MCG/ACT IN AERO
6.0000 | INHALATION_SPRAY | RESPIRATORY_TRACT | Status: DC
  Administered 2011-10-27 (×3): 6 via RESPIRATORY_TRACT
  Filled 2011-10-27: qty 14.7

## 2011-10-27 MED ORDER — IPRATROPIUM-ALBUTEROL 18-103 MCG/ACT IN AERO
2.0000 | INHALATION_SPRAY | Freq: Two times a day (BID) | RESPIRATORY_TRACT | Status: DC
  Administered 2011-10-28: 2 via RESPIRATORY_TRACT

## 2011-10-27 MED ORDER — FENTANYL BOLUS VIA INFUSION
50.0000 ug | Freq: Four times a day (QID) | INTRAVENOUS | Status: DC | PRN
  Filled 2011-10-27: qty 100

## 2011-10-27 MED ORDER — KCL IN DEXTROSE-NACL 20-5-0.45 MEQ/L-%-% IV SOLN
INTRAVENOUS | Status: DC
  Administered 2011-10-27 – 2011-10-30 (×7): via INTRAVENOUS
  Filled 2011-10-27 (×14): qty 1000

## 2011-10-27 MED ORDER — ENOXAPARIN SODIUM 30 MG/0.3ML ~~LOC~~ SOLN
30.0000 mg | SUBCUTANEOUS | Status: DC
  Administered 2011-10-27 – 2011-11-04 (×9): 30 mg via SUBCUTANEOUS
  Filled 2011-10-27 (×9): qty 0.3

## 2011-10-27 MED ORDER — SODIUM CHLORIDE 0.9 % IV SOLN: INTRAVENOUS | Status: DC

## 2011-10-27 MED ORDER — BIOTENE DRY MOUTH MT LIQD
15.0000 mL | Freq: Four times a day (QID) | OROMUCOSAL | Status: DC
  Administered 2011-10-27 – 2011-11-01 (×23): 15 mL via OROMUCOSAL

## 2011-10-27 MED ORDER — PIPERACILLIN-TAZOBACTAM 3.375 G IVPB 30 MIN
3.3750 g | Freq: Once | INTRAVENOUS | Status: AC
  Administered 2011-10-27: 3.375 g via INTRAVENOUS
  Filled 2011-10-27: qty 50

## 2011-10-27 NOTE — Progress Notes (Signed)
1 Day Post-Op  Subjective: Pt doing OK.  On spontaneous breathing trial now.    Objective: Vital signs in last 24 hours: Temp:  [97.6 F (36.4 C)-100 F (37.8 C)] 99.1 F (37.3 C) (05/28 0400) Pulse Rate:  [77-139] 85  (05/28 0700) Resp:  [13-22] 16  (05/28 0700) BP: (98-135)/(45-102) 98/55 mmHg (05/28 0700) SpO2:  [93 %-100 %] 100 % (05/28 0700) FiO2 (%):  [40 %-100 %] 40 % (05/28 0700) Weight:  [122 lb 5.7 oz (55.5 kg)-123 lb 7.3 oz (56 kg)] 123 lb 7.3 oz (56 kg) (05/28 0400)    Intake/Output from previous day: 05/27 0701 - 05/28 0700 In: 2297.7 [I.V.:2247.7; IV Piggyback:50] Out: 675 [Urine:600; Blood:75] Intake/Output this shift:    General appearance: alert and no distress Resp: breathing comfortably on PS 5/5 Cardio: regular rate and rhythm GI: soft, slightly distended.  Seems appropriately tender Incision/Wound:  Dressing c/d/i.  Lab Results:   Basename 10/27/11 0520 10/27/11 0140  WBC 22.0* 22.2*  HGB 12.3 12.1  HCT 37.7 37.3  PLT 284 368   BMET  Basename 10/27/11 0520 10/27/11 0140 10/26/11 1617  NA 133* -- 137  K 4.3 -- 3.9  CL 99 -- 97  CO2 19 -- 28  GLUCOSE 158* -- 126*  BUN 18 -- 24*  CREATININE 0.97 1.01 --  CALCIUM 8.8 -- 10.2   PT/INR  Basename 10/26/11 1617  LABPROT 13.6  INR 1.02   ABG  Basename 10/27/11 0101 10/26/11 1635  PHART 7.396 --  HCO3 22.6 31.4*    Studies/Results: Ct Abdomen Pelvis W Contrast  10/26/2011  *RADIOLOGY REPORT*  Clinical Data: Right lower quadrant pain.  Distension.  History of hysterectomy and surgery for vaginal prolapse.  Pain is worse with movement.  CT ABDOMEN AND PELVIS WITH CONTRAST  Technique:  Multidetector CT imaging of the abdomen and pelvis was performed following the standard protocol during bolus administration of intravenous contrast.  Contrast: 80mL OMNIPAQUE IOHEXOL 300 MG/ML  SOLN  Comparison: CT of the abdomen pelvis 12/02/2010  Findings: The heart is enlarged.  There are coronary artery  calcifications.  There is bibasilar atelectasis/scarring.  Within the lower central mesentery, there is stranding.  Small locules of gas are identified adjacent to several distal ileal loops.  Findings are consistent with bowel perforation, best localized to the distal ileum.  There are mildly dilated loops of ileum which to contain contrast.  No evidence for bowel obstruction.  Measures 8 mm and appears irregular, also surrounded by inflammatory change within the right lower quadrant.  Findings are consistent with a secondary inflammation or appendicitis secondarily involving the mesentery and bowel loops.  No focal abnormality identified within the liver, spleen.  Small low attenuation lesion is identified along the posterior aspect of the pancreas.  This may represent a pseudocyst or other cystic lesion.  This can be further characterized with MRI using pancreatic protocol.  No focal abnormality identified within the kidneys.  Gallbladder is present.  The uterus is surgically absent.  There is a moderate rectocele. Multiple colonic diverticula are present.  Right lower quadrant/adnexal low attenuation lesion appears stable since prior study, 4.1 x 3.6 cm.  There are moderate degenerative changes throughout the lower thoracic and lumbar spine.  There is moderate scoliosis of the spine.  IMPRESSION:  1.  Significant inflammatory changes involving the mid right lower quadrant mesentery.  Locules of gas within the mesentery are consistent with bowel perforation. 2.  The appendix is thickened and irregular, consistent with  inflamed appendix. It is difficult to know whether this is a primary secondary process. 3.  Cardiomegaly and coronary calcifications. 4.  Small low attenuation lesion within the dorsum of the pancreas. This can be further characterized using MRI with contrast and pancreatic protocol.  The findings were discussed with Dr.Kohut on 10/26/2011 at 7:45 p.m.  Original Report Authenticated By: Patterson Hammersmith, M.D.   Portable Chest Xray In Am  10/27/2011  *RADIOLOGY REPORT*  Clinical Data: Assessed lung fields and endotracheal tube position.  PORTABLE CHEST - 1 VIEW  Comparison: Chest x-ray 10/27/2011.  Findings: An endotracheal tube is in place with tip 1.1 cm above the carina. A nasogastric tube is seen extending into the stomach, however, the tip of the nasogastric tube extends below the lower margin of the image.  Lung volumes remain low.  There are bibasilar opacities (left greater than right), favored to reflect areas of atelectasis, however, superimposed airspace consolidation (particularly in the left lower lobe) is difficult to exclude. Mild blunting of the left costophrenic sulcus may suggest a small left-sided pleural effusion, or may be secondary to position and underpenetration.  Pulmonary vascular crowding without frank pulmonary edema.  Heart size is within normal limits.  Mediastinal contours are unremarkable.  Atherosclerotic calcifications within the arch of the aorta.  IMPRESSION: 1.  Support apparatus, as above. Please take note of the low position of the endotracheal tube and consider withdrawal approximately 3-4 cm for more optimal placement. 2.  Low lung volumes with persistent bibasilar opacities which may represent areas of atelectasis and/or consolidation, possibly with a small left-sided pleural effusion.  These results will be called to the ordering clinician or representative by the Radiologist Assistant, and communication documented in the PACS Dashboard.  Original Report Authenticated By: Florencia Reasons, M.D.   Portable Chest Xray  10/27/2011  *RADIOLOGY REPORT*  Clinical Data: Endotracheal tube placement  PORTABLE CHEST - 1 VIEW  Comparison: 10/26/2011  Findings: Interval placement of endotracheal tube with tip about 2.3 cm above the carina.  Enteric tube has been placed with tip in the left upper quadrant consistent with location in the stomach. Shallow inspiration.  Mild  cardiac enlargement with normal pulmonary vascularity.  Developing infiltration or atelectasis in the right lung base.  The esophagus appears to be distended and gas filled.  No pneumothorax.  No apparent blunting of costophrenic angles. There are bilateral chronic shoulder dislocations.  IMPRESSION: Endotracheal tube with tip about 2.3 cm above the carina.  Enteric tube tip in the left upper quadrant, likely in the stomach. Esophagus appears to be dilated gas filled.  Developing infiltration or atelectasis in the right lung base.  Original Report Authenticated By: Marlon Pel, M.D.   Dg Chest Portable 1 View  10/26/2011  *RADIOLOGY REPORT*  Clinical Data: Abdominal pain.  Hypertension.  PORTABLE CHEST - 1 VIEW  Comparison: 08/16/2010  Findings: Patient is partially rotated to the left.  Mild cardiomegaly stable.  Both lungs are clear.  No evidence of free air beneath the diaphragms.  Chronic end-stage arthritis and dislocations of both shoulder joints again seen.  IMPRESSION: Mild cardiomegaly.  No active lung disease or other acute findings.  Original Report Authenticated By: Danae Orleans, M.D.    Anti-infectives: Anti-infectives     Start     Dose/Rate Route Frequency Ordered Stop   10/27/11 0800   piperacillin-tazobactam (ZOSYN) IVPB 3.375 g        3.375 g 12.5 mL/hr over 240 Minutes Intravenous Every  8 hours 10/27/11 0059     10/27/11 0100   piperacillin-tazobactam (ZOSYN) IVPB 3.375 g        3.375 g 100 mL/hr over 30 Minutes Intravenous  Once 10/27/11 0059 10/27/11 0220   10/26/11 2100   ertapenem (INVANZ) 1 g in sodium chloride 0.9 % 50 mL IVPB        1 g 100 mL/hr over 30 Minutes Intravenous Every 24 hours 10/26/11 2029     10/26/11 2000   metroNIDAZOLE (FLAGYL) IVPB 500 mg  Status:  Discontinued        500 mg 100 mL/hr over 60 Minutes Intravenous  Once 10/26/11 1947 10/26/11 2029   10/26/11 2000   ceFAZolin (ANCEF) IVPB 1 g/50 mL premix  Status:  Discontinued        1  g 100 mL/hr over 30 Minutes Intravenous  Once 10/26/11 1947 10/26/11 2029          Assessment/Plan: s/p Procedure(s) (LRB): EXPLORATORY LAPAROTOMY (N/A) SMALL BOWEL RESECTION (N/A) Extubate per CCM. Leave NGT NPO Antibiotics for perforated bowel times 5 days.    LOS: 1 day    [facility] [place] 10/27/2011

## 2011-10-27 NOTE — Procedures (Signed)
Extubation Procedure Note  Patient Details:   Name: Breonia Kirstein DOB: Jun 29, 1927 MRN: [id number]   Airway Documentation:     Evaluation  O2 sats: stable throughout Complications: No apparent complications Patient did tolerate procedure well. Bilateral Breath Sounds: Clear Suctioning: Airway Yes Pt. has + cuff leak, able to hold head off bed on command, NIF-20 through vent circuit, VC 800cc, good gag reflex, extubated to 2lpm n/c humidified, no post extubation Stridor noted, I/S placed in room/started, RT to monitor. Joylene John 10/27/2011, 10:30 AM

## 2011-10-27 NOTE — Progress Notes (Signed)
Name: Alizah Sills MRN: [id number] DOB: [birth year]    LOS: 1  Referring Provider:  Corliss Skains Reason for Referral:  Postop respiratory failure  PULMONARY / CRITICAL CARE MEDICINE  The patient is encephalopathic and unable to provide history, which was obtained for available medical records.  HPI:  78 yo with dementia and multiple comorbidities admitted on 5/27 for small bowel perforation secondary to swallowed foreign body who underwent exploratory laparotomy / small bowel resection and was brought to ICU intubated.   Current Status: Critical but improving.  Vital Signs: Temp:  [97.6 F (36.4 C)-100 F (37.8 C)] 99.1 F (37.3 C) (05/28 0400) Pulse Rate:  [77-139] 95  (05/28 1000) Resp:  [13-22] 21  (05/28 1000) BP: (94-135)/(43-102) 94/44 mmHg (05/28 1000) SpO2:  [93 %-100 %] 100 % (05/28 1000) FiO2 (%):  [40 %-100 %] 40 % (05/28 1000) Weight:  [55.5 kg (122 lb 5.7 oz)-56 kg (123 lb 7.3 oz)] 56 kg (123 lb 7.3 oz) (05/28 0400)  Physical Examination: General:  Sedated, synchronous Neuro:  Nonfocal, moves all extremities HEENT:  Dry membranes, NGT, ETT Neck:  Supple Cardiovascular:  RRR, no murmurs Lungs:  Bilateral diminished breath sounds, no wheezes Abdomen:  Bowel sounds absent, surgical dressing c/d/i Musculoskeletal:  Pedal edema Skin:  Surgical dressing R knee  Active Problems:  Acute respiratory failure  Acute encephalopathy  Perforated viscus  Peritonitis (acute) generalized  Adrenal insufficiency, primary, iatrogenic  Acute renal failure   ASSESSMENT AND PLAN  PULMONARY  Lab 10/27/11 0101 10/26/11 1635  PHART 7.396 --  PCO2ART 37.7 --  PO2ART 273.0* --  HCO3 22.6 31.4*  O2SAT 99.6 46.0   Ventilator Settings: Vent Mode:  [-] PRVC FiO2 (%):  [40 %-100 %] 40 % Set Rate:  [14 bmp] 14 bmp Vt Set:  [400 mL] 400 mL PEEP:  [5 cmH20] 5 cmH20 Pressure Support:  [5 cmH20] 5 cmH20 Plateau Pressure:  [7 cmH20] 7 cmH20  CXR:  Pending ETT:  5/27 >>>5/28  A:   Acute postop respiratory failure.  History of "Asthma" but no evidence of acute bronchospasm. P:   SBT to extubate today. IS and flutter valve. PT/OT evaluation. Titrate O2 down.  CARDIOVASCULAR  Lab 10/26/11 1616  TROPONINI --  LATICACIDVEN 1.5  PROBNP --   ECG:  5/27 >>>Sinus tachycardia Lines: NA  A: Hemodynamically stable.  No evidence of ischemia / arrhythmia. P:  Telemetry.  RENAL  Lab 10/27/11 0520 10/27/11 0140 10/26/11 1617  NA 133* -- 137  K 4.3 -- 3.9  CL 99 -- 97  CO2 19 -- 28  BUN 18 -- 24*  CREATININE 0.97 1.01 1.16*  CALCIUM 8.8 -- 10.2  MG -- -- --  PHOS -- -- --   Intake/Output      05/27 0701 - 05/28 0700 05/28 0701 - 05/29 0700   I.V. (mL/kg) 2247.7 (40.1) 310 (5.5)   IV Piggyback 50 50   Total Intake(mL/kg) 2297.7 (41) 360 (6.4)   Urine (mL/kg/hr) 600 (0.4) 90 (0.4)   Blood 75    Total Output 675 90   Net +1622.7 +270         Foley:  5/37  >>>   A:  Acute renal failure / prerenal azotemia.  Low UOP right now. P:   IVF NS 75 mL/h with 500 ml IV bolus x1. ?TPN vs TF, will defer to surgery. BMP in AM  GASTROINTESTINAL  Lab 10/26/11 1617  AST 18  ALT 9  ALKPHOS 72  BILITOT 0.3  PROT 6.5  ALBUMIN 3.1*    A:  Perforated viscus.  Bowel resection. S/p Exp Lap. P:   Per Surgery  HEMATOLOGIC  Lab 10/27/11 0520 10/27/11 0140 10/26/11 1617  HGB 12.3 12.1 12.3  HCT 37.7 37.3 38.8  PLT 284 368 418*  INR -- -- 1.02  APTT -- -- --   A:  Reative thrombocytosis. P:  CBC in AM  INFECTIOUS  Lab 10/27/11 0520 10/27/11 0140 10/26/11 1617  WBC 22.0* 22.2* 10.2  PROCALCITON -- -- --   Cultures: None Antibiotics: 5/27  Ertapenem >>>  A:  Suspected peritonitis P:   As above  ENDOCRINE  Lab 10/27/11 0420 10/27/11 0144  GLUCAP 158* 77   A:  Hypothyroidism.  Suspected adrenal insufficiency (chronic Prednisone). P:   Hydrocortisone 50 mg IV q6h Levothyroxine 50 mcg IV daily SSI/CBG as on steroids  NEUROLOGIC  A:  Acute  encephalopathy (anesthesia, sedation).  History of dementia.  Distant history of alcohol abuse. P:   Fentanyl gtt off and now fentanyl pushes for pain. Ativan boluses Daily WUA  Spoke with the husband extensively, after discussion, decided to make patient a LCB with no CPR/cardioversion/longterm tube but otherwise ok to do.  The patient is critically ill with multiple organ systems failure and requires high complexity decision making for assessment and support, frequent evaluation and titration of therapies, application of advanced monitoring technologies and extensive interpretation of multiple databases. Critical Care Time devoted to patient care services described in this note is 30 minutes.  Alyson Reedy, M.D. [facility] Pulmonary/Critical Care Medicine. Pager: [phone]. After hours pager: [phone].

## 2011-10-27 NOTE — Progress Notes (Signed)
INITIAL ADULT NUTRITION ASSESSMENT Date: 10/27/2011   Time: 11:27 AM Reason for Assessment: Vent/Nutrition Risk  ASSESSMENT: Female 78 y.o.  Dx: Abdominal pain  Hx:  Past Medical History  Diagnosis Date  . Gout   . INSOMNIA   . ALLERGIC RHINITIS   . ASTHMA   . OSTEOARTHRITIS   . Rheumatoid arthritis   . HYPERTENSION   . HYPERLIPIDEMIA   . GERD (gastroesophageal reflux disease)   . Depression   . ALCOHOL ABUSE     hx detox, dry since 2010  . HYPOTHYROIDISM   . URINARY INCONTINENCE    Related Meds:     . albuterol-ipratropium  6 puff Inhalation Q4H  . antiseptic oral rinse  15 mL Mouth Rinse QID  . chlorhexidine  15 mL Mouth Rinse BID  . enoxaparin  30 mg Subcutaneous Q24H  . ertapenem (INVANZ) IV  1 g Intravenous Q24H  . hydrocortisone sodium succinate  50 mg Intravenous Q6H  . insulin aspart  0-15 Units Subcutaneous Q4H  . levothyroxine  50 mcg Intravenous Daily  .  morphine injection  4 mg Intravenous Once  . ondansetron (ZOFRAN) IV  4 mg Intravenous Once  . pantoprazole (PROTONIX) IV  40 mg Intravenous Daily  . piperacillin-tazobactam  3.375 g Intravenous Once  . piperacillin-tazobactam (ZOSYN)  IV  3.375 g Intravenous Q8H  . sodium chloride  1,000 mL Intravenous Once  . sodium chloride  500 mL Intravenous Once  . DISCONTD:  ceFAZolin (ANCEF) IV  1 g Intravenous Once  . DISCONTD: enoxaparin  40 mg Subcutaneous Q24H  . DISCONTD: iohexol  20 mL Oral Q1 Hr x 2  . DISCONTD: LORazepam  1 mg Intravenous Once  . DISCONTD: metronidazole  500 mg Intravenous Once  . DISCONTD:  morphine injection  4 mg Intravenous Once   Ht: 5\' 2"  (157.5 cm)  Wt: 123 lb 7.3 oz (56 kg)  Ideal Wt: 50 kg % Ideal Wt: 112%  Usual Wt:  Wt Readings from Last 10 Encounters:  10/27/11 123 lb 7.3 oz (56 kg)  10/27/11 123 lb 7.3 oz (56 kg)  07/03/11 113 lb 1.9 oz (51.311 kg)  05/25/11 111 lb 0.6 oz (50.367 kg)  02/25/11 115 lb 6.4 oz (52.345 kg)  01/12/11 114 lb 12.8 oz (52.073 kg)    11/03/10 124 lb 8 oz (56.473 kg)  08/21/10 123 lb 8 oz (56.019 kg)  08/11/10 123 lb 12.8 oz (56.155 kg)   % Usual Wt: 100%  Body mass index is 22.58 kg/(m^2).  Food/Nutrition Related Hx: pt intubated  Labs:  CMP     Component Value Date/Time   NA 133* 10/27/2011 0520   K 4.3 10/27/2011 0520   CL 99 10/27/2011 0520   CO2 19 10/27/2011 0520   GLUCOSE 158* 10/27/2011 0520   BUN 18 10/27/2011 0520   CREATININE 0.97 10/27/2011 0520   CALCIUM 8.8 10/27/2011 0520   PROT 6.5 10/26/2011 1617   ALBUMIN 3.1* 10/26/2011 1617   AST 18 10/26/2011 1617   ALT 9 10/26/2011 1617   ALKPHOS 72 10/26/2011 1617   BILITOT 0.3 10/26/2011 1617   GFRNONAA 53* 10/27/2011 0520   GFRAA 62* 10/27/2011 0520   CBG (last 3)   Basename 10/27/11 0420 10/27/11 0144  GLUCAP 158* 77    Intake/Output Summary (Last 24 hours) at 10/27/11 1130 Last data filed at 10/27/11 1000  Gross per 24 hour  Intake 2657.67 ml  Output    765 ml  Net 1892.67 ml   Diet  Order: NPO  Supplements/Tube Feeding: none  IVF:    dextrose 5 % and 0.45 % NaCl with KCl 20 mEq/L Last Rate: 100 mL/hr at 10/27/11 1000  fentaNYL infusion INTRAVENOUS Last Rate: 50 mcg/hr (10/27/11 1000)  fentaNYL infusion INTRAVENOUS Last Rate: 50 mcg/hr (10/27/11 0148)  midazolam (VERSED) infusion   DISCONTD: sodium chloride    Pt remains intubated on vent. Pt admitted 5/27 for small bowel perforation secondary to swallowed foreign body who underwent exploratory laparotomy/small bowel resection.   Estimated Nutritional Needs:   Kcal: 1253 Protein: 75-90 grams Fluid: >1.6 L/day  NUTRITION DIAGNOSIS: -Inadequate oral intake (NI-2.1).  Status: Ongoing  RELATED TO: inability to eat  AS EVIDENCE BY: NPO status  MONITORING/EVALUATION(Goals): Goal: Provide >/= 90% of her estimated needs. Monitor: vent status, gi function  EDUCATION NEEDS: -No education needs identified at this time  INTERVENTION:  Recommend trial of enteral nutrition using Vital AF  1.2, start @ 20 ml/hr   Increase as tolerated, by 10 ml every 8 hours to goal rate of 45 ml/hr   At goal above TF regimen will provide: 1296 kcal, 81 grams protein, 875 ml H2O  Dietitian #:[phone]  DOCUMENTATION CODES Per approved criteria  -Not Applicable    Kendell Bane Cornelison 10/27/2011, 11:27 AM

## 2011-10-27 NOTE — Progress Notes (Signed)
78yo female c/o acute onset of severe abdominal pain, now s/p ex-lap and small bowel resection, remains intubated for post-op respiratory failure, to begin Zosyn for perforated viscus.  Will begin Zosyn 3.375g IV Q8H for CrCl ~30 ml/min.  Vernard Gambles, PharmD, BCPS 10/27/2011 12:58 AM

## 2011-10-28 LAB — CBC
Hemoglobin: 10.6 g/dL — ABNORMAL LOW (ref 12.0–15.0)
MCH: 31.5 pg (ref 26.0–34.0)
MCHC: 32.4 g/dL (ref 30.0–36.0)
Platelets: 365 10*3/uL (ref 150–400)
RDW: 14.7 % (ref 11.5–15.5)

## 2011-10-28 LAB — BASIC METABOLIC PANEL
Calcium: 9 mg/dL (ref 8.4–10.5)
GFR calc Af Amer: 60 mL/min — ABNORMAL LOW (ref >90)
GFR calc non Af Amer: 52 mL/min — ABNORMAL LOW (ref >90)
Glucose, Bld: 155 mg/dL — ABNORMAL HIGH (ref 70–99)
Sodium: 141 mEq/L (ref 135–145)

## 2011-10-28 LAB — GLUCOSE, CAPILLARY
Glucose-Capillary: 130 mg/dL — ABNORMAL HIGH (ref 70–99)
Glucose-Capillary: 147 mg/dL — ABNORMAL HIGH (ref 70–99)
Glucose-Capillary: 136 mg/dL — ABNORMAL HIGH (ref 70–99)

## 2011-10-28 LAB — PHOSPHORUS: Phosphorus: 2.9 mg/dL (ref 2.3–4.6)

## 2011-10-28 MED ORDER — HYDROCORTISONE SOD SUCCINATE 100 MG IJ SOLR
25.0000 mg | Freq: Two times a day (BID) | INTRAMUSCULAR | Status: DC
  Administered 2011-10-28 – 2011-10-30 (×5): 25 mg via INTRAVENOUS
  Filled 2011-10-28 (×6): qty 0.5

## 2011-10-28 MED ORDER — ALBUTEROL SULFATE (5 MG/ML) 0.5% IN NEBU: 2.5000 mg | INHALATION_SOLUTION | RESPIRATORY_TRACT | Status: DC | PRN

## 2011-10-28 MED ORDER — FENTANYL CITRATE 0.05 MG/ML IJ SOLN: 12.5000 ug | INTRAMUSCULAR | Status: DC | PRN

## 2011-10-28 NOTE — Care Management Note (Unsigned)
    Page 1 of 2   10/29/2011     4:16:11 PM   CARE MANAGEMENT NOTE 10/29/2011  Patient:  Connie Fitzpatrick, Connie Fitzpatrick   Account Number:  [id number]  Date Initiated:  10/28/2011  Documentation initiated by:  Jacquelynn Cree  Subjective/Objective Assessment:   Admitted with abd pian, had exploratory lap with small bowel resection 10/26/11.  LIves with husband who is main caregiver, has aides from [facility] 3hrs/day 6 days /wk.     Action/Plan:   PT eval-recommended SNF  OT eval  LTAC eval-appropriate for LTAC   Anticipated DC Date:  10/31/2011   Anticipated DC Plan:  LONG TERM ACUTE CARE (LTAC)      DC Planning Services  CM consult      Choice offered to / List presented to:             Status of service:  In process, will continue to follow Medicare Important Message given?   (If response is "NO", the following Medicare IM given date fields will be blank) Date Medicare IM given:   Date Additional Medicare IM given:    Discharge Disposition:    Per UR Regulation:  Reviewed for med. necessity/level of care/duration of stay  If discussed at Long Length of Stay Meetings, dates discussed:    Comments:  PCP Dr. Rene Paci  10/29/11 Spoke with Maxcine Ham from [facility] patient is appropraite for LTAC level of care. Spoke with Mr Seyer who is agreeable to LTC and offered choice. He will decide which LTAC facility and let me know by 10/30/11.Will continue to follow. Jacquelynn Cree RN, BSN, CCM  10/28/11 Spoke with patient's spouse regarding d/c plans. Patient required max assist with ADLs prior to admission. Has [facility] aides 3 hrs /day 6 days a wk, husband cares for patient the rest of the time.Has had [facility] for RN and PT in the past and would consider other agency for future HHC.Mr Luebke is agreeable to [facility] evaluation and agreeable to SNF if recommended by PT/OT.  Contacted Pablo Ledger at [employer] and left message on VM requesting evaluation. Will continue to follow for d/c needs. Jacquelynn Cree RN, BSN, CCM

## 2011-10-28 NOTE — Progress Notes (Signed)
Name: Connie Fitzpatrick MRN: [id number] DOB: 1928-04-18    LOS: 2  Referring Provider:  Corliss Skains Reason for Referral:  Postop respiratory failure  PULMONARY / CRITICAL CARE MEDICINE  The patient is encephalopathic and unable to provide history, which was obtained for available medical records.  HPI:  78 yo with dementia and multiple comorbidities admitted on 5/27 for small bowel perforation secondary to swallowed foreign body who underwent exploratory laparotomy / small bowel resection and was brought to ICU intubated.   Current Status: No distress. Poorly oriented. + F/C  Vital Signs: Temp:  [97 F (36.1 C)-98.2 F (36.8 C)] 97 F (36.1 C) (05/29 0400) Pulse Rate:  [87-110] 110  (05/29 1200) Resp:  [12-21] 18  (05/29 1200) BP: (97-166)/(44-82) 166/82 mmHg (05/29 1200) SpO2:  [94 %-99 %] 97 % (05/29 1200)  Physical Examination: General:  RASS 0, no distress Neuro:  Nonfocal, moves all extremities HEENT:  NAD Neck:  Supple Cardiovascular:  RRR, no murmurs Lungs:  Clear anteriorly Abdomen:  Bowel sounds diminished, surgical dressing c/d/i Musculoskeletal:  Pedal edema Skin:  Surgical dressing R knee  Active Problems:  Acute respiratory failure  Acute encephalopathy  Perforated viscus  Peritonitis (acute) generalized  Adrenal insufficiency, primary, iatrogenic  Acute renal failure   ASSESSMENT AND PLAN  PULMONARY  A:  Acute postop respiratory failure.  History of "Asthma" but no evidence of acute bronchospasm. P:   Tolerating extubation. Cont routine post op resp care.   RENAL  Lab 10/28/11 0337 10/27/11 0520 10/27/11 0140 10/26/11 1617  NA 141 133* -- 137  K 4.2 4.3 -- --  CL 109 99 -- 97  CO2 23 19 -- 28  BUN 16 18 -- 24*  CREATININE 0.99 0.97 1.01 1.16*  CALCIUM 9.0 8.8 -- 10.2  MG 1.9 -- -- --  PHOS 2.9 -- -- --   Intake/Output      05/28 0701 - 05/29 0700 05/29 0701 - 05/30 0700   I.V. (mL/kg) 2315 (41.3) 467.5 (8.3)   IV Piggyback 150 50   Total  Intake(mL/kg) 2465 (44) 517.5 (9.2)   Urine (mL/kg/hr) 1990 (1.5) 150 (0.4)   Blood     Total Output 1990 150   Net +475 +367.5         Foley:  5/37  >>>  A:  Acute renal failure / prerenal azotemia.  Resolved P:  Maintenance fluids per [facility]   GASTROINTESTINAL  A:  Perforated viscus.  Bowel resection. S/p Exp Lap. P:   Post op mgmt per Surgery    INFECTIOUS Cultures: None Antibiotics: 5/27  Ertapenem >>>  A:  Peritonitis P:  Abx per [facility]   ENDOCRINE  Lab 10/27/11 2327 10/27/11 1530 10/27/11 1203 10/27/11 0745 10/27/11 0420  GLUCAP 142* 169* 138* 165* 158*   A:  Hypothyroidism.  Suspected adrenal insufficiency (chronic Prednisone). P:   Hydrocortisone 25 mg IV q 12 hrs - once she is taking PO meds, recommend switch back to her baseline dose of Pred (5mg /d)  Cont Levothyroxine 50 mcg IV daily - change to PO equiv when taking POs   NEUROLOGIC  A:  Acute encephalopathy (anesthesia, sedation).  History of dementia.  Distant history of alcohol abuse. P:  Cont current Rx    PCCM will sign off. Please call if we can be of further assistance  Billy Fischer, MD;  PCCM service; Mobile [phone]

## 2011-10-28 NOTE — Progress Notes (Signed)
Notified Dr Lindie Spruce of increased BP of 175/89. No orders given at present. Will continue to monitor. [place], [place] M

## 2011-10-28 NOTE — Evaluation (Signed)
Physical Therapy Evaluation Patient Details Name: Connie Fitzpatrick MRN: [id number] DOB: March 30, 1928 Today's Date: 10/28/2011 Time: 4098-1191 PT Time Calculation (min): 25 min  PT Assessment / Plan / Recommendation Clinical Impression  pt presents s/p Bowel Resection secondary to foreign body removal and VDRF.  pt with history of Dementia and per husbnad has had multiple falls at home.  Husband not able to provide 24hr care as he works and has an Engineer, production for only 3 hrs per day for 6 days/wk.  Husband notes he is planning on trying to find 24hr A for pt.  Feel pt would benefit from ST-SNF at D/C to increase Independence prior to D/C to home.      PT Assessment  Patient needs continued PT services    Follow Up Recommendations  Skilled nursing facility    Barriers to Discharge None      lEquipment Recommendations  Defer to next venue    Recommendations for Other Services OT consult   Frequency Min 3X/week    Precautions / Restrictions Precautions Precautions: Fall Precaution Comments: pt with abdominal incision, multiple lines Restrictions Weight Bearing Restrictions: No   Pertinent Vitals/Pain "Medium" in Abdomen      Mobility  Bed Mobility Bed Mobility: Supine to Sit;Sitting - Scoot to Edge of Bed Supine to Sit: 1: +2 Total assist Supine to Sit: Patient Percentage: 20% Sitting - Scoot to Edge of Bed: 1: +2 Total assist Sitting - Scoot to Edge of Bed: Patient Percentage: 40% Details for Bed Mobility Assistance: cues throughout for technique and encouragement.  pt very guarded mobility secondary to pain in abdomen.   Transfers Transfers: Sit to Stand;Stand to Sit;Stand Pivot Transfers Sit to Stand: 1: +2 Total assist;From bed;Without upper extremity assist Sit to Stand: Patient Percentage: 20% Stand to Sit: 1: +2 Total assist;Without upper extremity assist;To chair/3-in-1 Stand to Sit: Patient Percentage: 30% Stand Pivot Transfers: 1: +2 Total assist Stand Pivot Transfers: Patient  Percentage: 20% Details for Transfer Assistance: pt fearful and guarded secondary to pain.  Utilized pad under pt's bottom and held under Bil UEs to avoid using safety belt over pt's abdominal incision.   Ambulation/Gait Ambulation/Gait Assistance: Not tested (comment) Stairs: No Wheelchair Mobility Wheelchair Mobility: No    Exercises     PT Diagnosis: Difficulty walking;Acute pain  PT Problem List: Decreased strength;Decreased activity tolerance;Decreased balance;Decreased mobility;Decreased cognition;Decreased knowledge of use of DME;Decreased safety awareness;Pain PT Treatment Interventions: DME instruction;Gait training;Functional mobility training;Therapeutic activities;Therapeutic exercise;Balance training;Neuromuscular re-education;Patient/family education;Cognitive remediation   PT Goals Acute Rehab PT Goals PT Goal Formulation: With family Time For Goal Achievement: 11/11/11 Potential to Achieve Goals: Good Pt will Roll Supine to Right Side: with supervision PT Goal: Rolling Supine to Right Side - Progress: Goal set today Pt will Roll Supine to Left Side: with supervision PT Goal: Rolling Supine to Left Side - Progress: Goal set today Pt will go Supine/Side to Sit: with supervision PT Goal: Supine/Side to Sit - Progress: Goal set today Pt will go Sit to Supine/Side: with supervision PT Goal: Sit to Supine/Side - Progress: Goal set today Pt will go Sit to Stand: with min assist PT Goal: Sit to Stand - Progress: Goal set today Pt will go Stand to Sit: with min assist PT Goal: Stand to Sit - Progress: Goal set today Pt will Ambulate: 51 - 150 feet;with min assist;with rolling walker PT Goal: Ambulate - Progress: Goal set today  Visit Information  Last PT Received On: 10/28/11 Assistance Needed: +2    Subjective Data  Subjective: Per Husband pt has had multiple falls at home Patient Stated Goal: Husband wishes for Home   Prior Functioning  Home Living Lives With:  Spouse Available Help at Discharge: Family;Personal care attendant;Available PRN/intermittently (Husband works and has Aide 3hrs x 6days/wk) Type of Home: House Home Access: Ramped entrance Home Layout: One level (ramp to a den) [employer] Shower/Tub: Health visitor: Handicapped height Bathroom Accessibility: Yes How Accessible: Accessible via wheelchair Home Adaptive Equipment: Grab bars around toilet;Grab bars in shower;Hand-held shower hose;Shower chair with back;Walker - four wheeled Prior Function Level of Independence: Needs assistance Needs Assistance: Bathing;Dressing;Meal Prep;Light Housekeeping;Gait Able to Take Stairs?: No Driving: No Vocation: Retired Comments: Husband notes pt at times can ambulate with [phone] ModI and other times requires him to push her as she sits on 4WW.   Communication Communication: No difficulties    Cognition  Overall Cognitive Status: History of cognitive impairments - further impaired Area of Impairment: Memory;Safety/judgement;Awareness of deficits Arousal/Alertness:  (Drowsy) Orientation Level: Disoriented to;Place;Time;Situation Behavior During Session: WFL for tasks performed Memory Deficits: Decreased recall of current situation Safety/Judgement: Decreased safety judgement for tasks assessed;Decreased awareness of need for assistance    Extremity/Trunk Assessment Right Lower Extremity Assessment RLE ROM/Strength/Tone: Deficits;Due to pain RLE ROM/Strength/Tone Deficits: pt very painful in abdomen and guards movements.  Appears to be >3/5 and husband notes history of fall on R hip without fx, however painful.   RLE Sensation: WFL - Light Touch Left Lower Extremity Assessment LLE ROM/Strength/Tone: Deficits;Due to pain LLE ROM/Strength/Tone Deficits: pt guards all movements secondary to abdominal pain, however appears grossly >3/5 LLE Sensation: WFL - Light Touch Trunk Assessment Trunk Assessment: Kyphotic   Balance  Balance Balance Assessed: Yes Static Sitting Balance Static Sitting - Balance Support: Bilateral upper extremity supported;Feet supported Static Sitting - Level of Assistance: 5: Stand by assistance;4: Min assist Static Sitting - Comment/# of Minutes: pt initially MinA to maintain sitting then as pain decreased able to be Supervision.    End of Session PT - End of Session Equipment Utilized During Treatment:  (Did not use belt secondary to abdominal incision.  ) Activity Tolerance: Patient limited by pain Patient left: in chair;with call bell/phone within reach [facility] in room) Nurse Communication: Mobility status   Sunny Schlein, [place] [phone] 10/28/2011, 1:31 PM

## 2011-10-29 DIAGNOSIS — I10 Essential (primary) hypertension: Secondary | ICD-10-CM

## 2011-10-29 LAB — GLUCOSE, CAPILLARY
Glucose-Capillary: 154 mg/dL — ABNORMAL HIGH (ref 70–99)
Glucose-Capillary: 144 mg/dL — ABNORMAL HIGH (ref 70–99)

## 2011-10-29 MED ORDER — HYDRALAZINE HCL 20 MG/ML IJ SOLN
20.0000 mg | INTRAMUSCULAR | Status: DC | PRN
  Administered 2011-10-29 – 2011-11-01 (×6): 20 mg via INTRAVENOUS
  Filled 2011-10-29 (×6): qty 1

## 2011-10-29 MED ORDER — HYDRALAZINE HCL 20 MG/ML IJ SOLN
INTRAMUSCULAR | Status: AC
  Filled 2011-10-29: qty 1

## 2011-10-29 MED ORDER — METOPROLOL TARTRATE 1 MG/ML IV SOLN
5.0000 mg | Freq: Once | INTRAVENOUS | Status: AC
  Administered 2011-10-29: 5 mg via INTRAVENOUS

## 2011-10-29 MED ORDER — METOPROLOL TARTRATE 1 MG/ML IV SOLN
5.0000 mg | Freq: Four times a day (QID) | INTRAVENOUS | Status: DC | PRN
  Administered 2011-10-29 – 2011-10-30 (×5): 5 mg via INTRAVENOUS
  Filled 2011-10-29 (×6): qty 5

## 2011-10-29 NOTE — Progress Notes (Signed)
Clinical Social Work Department CLINICAL SOCIAL WORK PLACEMENT NOTE 10/29/2011  Patient:  ASHARI, LLEWELLYN  Account Number:  [id number] Admit date:  10/26/2011  Clinical Social Worker:  Margaree Mackintosh  Date/time:  10/29/2011 02:22 PM  Clinical Social Work is seeking post-discharge placement for this patient at the following level of care:   SKILLED NURSING   (*CSW will update this form in Epic as items are completed)   10/29/2011  Patient/family provided with [facility] Department of Clinical Social Work's list of facilities offering this level of care within the geographic area requested by the patient (or if unable, by the patient's family).  10/29/2011  Patient/family informed of their freedom to choose among providers that offer the needed level of care, that participate in Medicare, Medicaid or managed care program needed by the patient, have an available bed and are willing to accept the patient.  10/29/2011  Patient/family informed of [facility]' ownership interest in [facility], as well as of the fact that they are under no obligation to receive care at this facility.  PASARR submitted to EDS on 10/29/2011 PASARR number received from EDS on 10/29/2011  FL2 transmitted to all facilities in geographic area requested by pt/family on  10/29/2011 FL2 transmitted to all facilities within larger geographic area on   Patient informed that his/her managed care company has contracts with or will negotiate with  certain facilities, including the following:     Patient/family informed of bed offers received:   Patient chooses bed at  Physician recommends and patient chooses bed at    Patient to be transferred to  on   Patient to be transferred to facility by   The following physician request were entered in Epic:   Additional Comments:

## 2011-10-29 NOTE — Clinical Social Work Psychosocial (Signed)
     Clinical Social Work Department BRIEF PSYCHOSOCIAL ASSESSMENT 10/29/2011  Patient:  Connie Fitzpatrick, Connie Fitzpatrick     Account Number:  [id number]     Admit date:  10/26/2011  Clinical Social Worker:  Margaree Mackintosh  Date/Time:  10/29/2011 02:25 PM  Referred by:  Physician  Date Referred:  10/29/2011 Referred for  SNF Placement   Other Referral:   Interview type:  Family Other interview type:    PSYCHOSOCIAL DATA Living Status:  FAMILY Admitted from facility:   Level of care:   Primary support name:  [phone] Primary support relationship to patient:  SPOUSE Degree of support available:   Adequate.    CURRENT CONCERNS Current Concerns  Post-Acute Placement   Other Concerns:    SOCIAL WORK ASSESSMENT / PLAN Clinical Social Worker met with pt's spouse at bedside, pt currenlty asleep.  CSW introduced self, explained role, and reviewed PT recommendations.  Family agreeable to SNF but first preference is [facility].  CSW to follow up with RN.  CSW submitted all appropriate paperwork.  CSW to continue to follow and assist as needed.   Assessment/plan status:   Other assessment/ plan:   Information/referral to community resources:   SNF  [facility]    PATIENTS/FAMILYS RESPONSE TO PLAN OF CARE: Pt currently asleep; spouse appeared to be receptive to CSW intervention.

## 2011-10-29 NOTE — Progress Notes (Signed)
Patient ID: Connie Fitzpatrick, female   DOB: 05/16/1928, 78 y.o.   MRN: [id number] 3 Days Post-Op  Subjective: Pt remains extubated.  Having some soreness.  Remains bloated.  No flatus yet.  Objective: Vital signs in last 24 hours: Temp:  [97.7 F (36.5 C)-99.5 F (37.5 C)] 98.9 F (37.2 C) (05/30 1549) Pulse Rate:  [90-122] 100  (05/30 1500) Resp:  [9-29] 15  (05/30 1500) BP: (140-190)/(69-101) 161/79 mmHg (05/30 1500) SpO2:  [96 %-99 %] 99 % (05/30 1500)    Intake/Output from previous day: 05/29 0701 - 05/30 0700 In: 2042.5 [I.V.:1892.5; IV Piggyback:150] Out: 1175 [Urine:1175] Intake/Output this shift: Total I/O In: 650 [I.V.:600; IV Piggyback:50] Out: 0   General appearance: alert and no distress Resp: breathing comfortably  Cardio: tachy with PVCs GI: soft, slightly distended.  Seems appropriately tender Incision/Wound:  Dressing c/d/i.  Lab Results:   [facility] 10/28/11 0337 10/27/11 0520  WBC 20.7* 22.0*  HGB 10.6* 12.3  HCT 32.7* 37.7  PLT 365 284   BMET  Basename 10/28/11 0337 10/27/11 0520  NA 141 133*  K 4.2 4.3  CL 109 99  CO2 23 19  GLUCOSE 155* 158*  BUN 16 18  CREATININE 0.99 0.97  CALCIUM 9.0 8.8   PT/INR  Basename 10/26/11 1617  LABPROT 13.6  INR 1.02   ABG  Basename 10/27/11 0101 10/26/11 1635  PHART 7.396 --  HCO3 22.6 31.4*    Studies/Results: No results found.  Anti-infectives: Anti-infectives     Start     Dose/Rate Route Frequency Ordered Stop   10/27/11 0800   piperacillin-tazobactam (ZOSYN) IVPB 3.375 g        3.375 g 12.5 mL/hr over 240 Minutes Intravenous Every 8 hours 10/27/11 0059     10/27/11 0100   piperacillin-tazobactam (ZOSYN) IVPB 3.375 g        3.375 g 100 mL/hr over 30 Minutes Intravenous  Once 10/27/11 0059 10/27/11 0220   10/26/11 2100   ertapenem (INVANZ) 1 g in sodium chloride 0.9 % 50 mL IVPB  Status:  Discontinued        1 g 100 mL/hr over 30 Minutes Intravenous Every 24 hours 10/26/11 2029 10/27/11  1258   10/26/11 2000   metroNIDAZOLE (FLAGYL) IVPB 500 mg  Status:  Discontinued        500 mg 100 mL/hr over 60 Minutes Intravenous  Once 10/26/11 1947 10/26/11 2029   10/26/11 2000   ceFAZolin (ANCEF) IVPB 1 g/50 mL premix  Status:  Discontinued        1 g 100 mL/hr over 30 Minutes Intravenous  Once 10/26/11 1947 10/26/11 2029          Assessment/Plan: s/p Procedure(s) (LRB): EXPLORATORY LAPAROTOMY (N/A) SMALL BOWEL RESECTION (N/A) Leave NGT NPO Antibiotics for perforated bowel times 5 days.    LOS: 3 days    Nicholus Chandran 10/29/2011

## 2011-10-29 NOTE — Progress Notes (Signed)
Patient complained of chest pain with SOB.  Upon further questioning patient states that the pain is between sore to heaviness and points to her right upper chest to left mid axillary line.  Patient denies numbness, tinging, nausea or vomiting.    Vitals: HR 125 Sinus Tach with PVCs  BP: 157/99  Sp02: 97% RA  RR: 21  Dr. Donell Beers notified of change, orders for STAT 12 lead EKG and one time lopressor 5 mg iv given.  EKG does not show any ST elevation.  Will cont. To monitor patient after lopressor has time to take effect.

## 2011-10-29 NOTE — Progress Notes (Signed)
Nutrition Follow-up  Diet Order:  NPO Pt extubated 5/28 but remains NPO.  Pt with NGT in place for suction. Pt reports wt loss PTA due to a decreased appetite. However, pt reports that her usual wt is 110 #, her current wt after surgery/IV fluids is 123 lbs. 24 hr recall reviewed with pt/husband and they report a decrease in intake recently due to a decreased appetite and GERD symptoms. Pt does like ensure and has drank it at home.   Meds: Scheduled Meds:   . antiseptic oral rinse  15 mL Mouth Rinse QID  . chlorhexidine  15 mL Mouth Rinse BID  . enoxaparin  30 mg Subcutaneous Q24H  . hydrALAZINE      . hydrocortisone sod succinate (SOLU-CORTEF) injection  25 mg Intravenous Q12H  . insulin aspart  0-15 Units Subcutaneous Q4H  . levothyroxine  50 mcg Intravenous Daily  . piperacillin-tazobactam (ZOSYN)  IV  3.375 g Intravenous Q8H   Continuous Infusions:   . dextrose 5 % and 0.45 % NaCl with KCl 20 mEq/L 75 mL/hr at 10/29/11 1239   PRN Meds:.albuterol, fentaNYL, hydrALAZINE, metoprolol, ondansetron (ZOFRAN) IV, ondansetron  Labs:  CMP     Component Value Date/Time   NA 141 10/28/2011 0337   K 4.2 10/28/2011 0337   CL 109 10/28/2011 0337   CO2 23 10/28/2011 0337   GLUCOSE 155* 10/28/2011 0337   BUN 16 10/28/2011 0337   CREATININE 0.99 10/28/2011 0337   CALCIUM 9.0 10/28/2011 0337   PROT 6.5 10/26/2011 1617   ALBUMIN 3.1* 10/26/2011 1617   AST 18 10/26/2011 1617   ALT 9 10/26/2011 1617   ALKPHOS 72 10/26/2011 1617   BILITOT 0.3 10/26/2011 1617   GFRNONAA 52* 10/28/2011 0337   GFRAA 60* 10/28/2011 0337   CBG (last 3)   Basename 10/29/11 0748 10/29/11 0325 10/28/11 2336  GLUCAP 143* 154* 111*    Intake/Output Summary (Last 24 hours) at 10/29/11 1337 Last data filed at 10/29/11 1200  Gross per 24 hour  Intake   1875 ml  Output   1025 ml  Net    850 ml    Weight Status:  No new weight  Re-estimated needs:  1400-1600 kcal; 75-90 grams  Nutrition Dx:  Inadequate oral intake r/t  inability to eat AEB NPO status; ongoing.  Goal: Provide >/= 90% of her estimated needs; not met.  INTERVENTION:  If unable to advance diet within next 72 hrs consider initiating TPN.  Monitor:  Diet advancement, plan of care   Kendell Bane Cornelison Pager #:  [phone]

## 2011-10-30 LAB — GLUCOSE, CAPILLARY
Glucose-Capillary: 155 mg/dL — ABNORMAL HIGH (ref 70–99)
Glucose-Capillary: 111 mg/dL — ABNORMAL HIGH (ref 70–99)
Glucose-Capillary: 121 mg/dL — ABNORMAL HIGH (ref 70–99)
Glucose-Capillary: 99 mg/dL (ref 70–99)

## 2011-10-30 MED ORDER — HYDROCORTISONE SOD SUCCINATE 100 MG IJ SOLR
50.0000 mg | Freq: Two times a day (BID) | INTRAMUSCULAR | Status: DC
  Administered 2011-10-30 – 2011-10-31 (×2): 50 mg via INTRAVENOUS
  Filled 2011-10-30 (×3): qty 1

## 2011-10-30 NOTE — Progress Notes (Signed)
Patient ID: Connie Fitzpatrick, female   DOB: 1927/10/07, 78 y.o.   MRN: [id number]   LOS: 4 days  POD#4  Subjective: No new c/o. Pain controlled, +flatus. No N/V.  Objective: Vital signs in last 24 hours: Temp:  [97 F (36.1 C)-98.9 F (37.2 C)] 98.6 F (37 C) (05/31 1200) Pulse Rate:  [82-121] 86  (05/31 1200) Resp:  [14-22] 19  (05/31 1200) BP: (145-185)/(65-99) 153/81 mmHg (05/31 1200) SpO2:  [95 %-100 %] 97 % (05/31 1200) Last BM Date: 10/30/11   NGT: Minimal output   General appearance: alert and no distress Resp: clear to auscultation bilaterally Cardio: regular rate and rhythm GI: Soft, +BS. Incision C/D/I.  Assessment/Plan: SB perf s/p SBR -- Continue abx as rx Ileus -- Improving. Will d/c NGT. Sips of clears. Dispo -- Possibly to [facility] today though she doesn't seem sick enough. Otherwise to SDU.   Freeman Caldron, PA-C Pager: [phone] General Trauma PA Pager: [phone]   10/30/2011

## 2011-10-30 NOTE — Progress Notes (Signed)
Physical Therapy Treatment Patient Details Name: Adreanna Fickel MRN: [id number] DOB: 01-31-1928 Today's Date: 10/30/2011 Time: 7829-5621 PT Time Calculation (min): 32 min  PT Assessment / Plan / Recommendation Comments on Treatment Session  Pt much more alert this session and willing to participate. Pt still limited  due to abdominal pain.    Follow Up Recommendations  Skilled nursing facility    Barriers to Discharge        Equipment Recommendations  Defer to next venue    Recommendations for Other Services OT consult  Frequency Min 3X/week   Plan Discharge plan remains appropriate;Frequency remains appropriate    Precautions / Restrictions Precautions Precautions: Fall Restrictions Weight Bearing Restrictions: No       Mobility  Bed Mobility Bed Mobility: Supine to Sit;Sitting - Scoot to [place] of Bed Rolling Right: 4: Min guard Rolling Left: 4: Min guard Supine to Sit: 1: +2 Total assist Supine to Sit: Patient Percentage: 50% Sitting - Scoot to Edge of Bed: 3: Mod assist Details for Bed Mobility Assistance: Pt complaints of pain throughout transfer, although was able to assist more with LEs and support of trunk Transfers Transfers: Sit to Stand;Stand to Sit;Stand Pivot Transfers Sit to Stand: 3: Mod assist;With upper extremity assist;From bed Stand to Sit: 3: Mod assist;With upper extremity assist;To chair/3-in-1 Stand Pivot Transfers: 1: +2 Total assist Stand Pivot Transfers: Patient Percentage: 60% Details for Transfer Assistance: Assist x 2 for support and stability during transfer. Pt able to move LEs to ease transfer. VC throughout for hand placement and sequencing Ambulation/Gait Ambulation/Gait Assistance: Not tested (comment)    Exercises     PT Diagnosis:    PT Problem List:   PT Treatment Interventions:     PT Goals Acute Rehab PT Goals PT Goal: Rolling Supine to Right Side - Progress: Progressing toward goal PT Goal: Rolling Supine to Left Side -  Progress: Progressing toward goal PT Goal: Supine/Side to Sit - Progress: Progressing toward goal PT Goal: Sit to Supine/Side - Progress: Progressing toward goal PT Goal: Sit to Stand - Progress: Progressing toward goal PT Goal: Stand to Sit - Progress: Progressing toward goal  Visit Information  Last PT Received On: 10/30/11 Assistance Needed: +2    Subjective Data      Cognition  Overall Cognitive Status: Appears within functional limits for tasks assessed/performed Arousal/Alertness: Awake/alert Orientation Level: Appears intact for tasks assessed Behavior During Session: [facility] for tasks performed    Balance  Balance Balance Assessed: Yes Static Sitting Balance Static Sitting - Balance Support: Bilateral upper extremity supported;Feet supported Static Sitting - Level of Assistance: 6: Modified independent (Device/Increase time)  End of Session PT - End of Session Equipment Utilized During Treatment: Gait belt Activity Tolerance: Patient tolerated treatment well Patient left: in chair;with call bell/phone within reach;with nursing in room Nurse Communication: Mobility status    Milana Kidney 10/30/2011, 4:40 PM  10/30/2011 Milana Kidney DPT PAGER: [phone] OFFICE: [phone]

## 2011-10-30 NOTE — Progress Notes (Signed)
Doing well. Start clears Transfer to telemetry May be ready to go [facility] over [facility].

## 2011-10-30 NOTE — Plan of Care (Signed)
Problem: Phase I Progression Outcomes Goal: Voiding-avoid urinary catheter unless indicated Outcome: Completed/Met Date Met:  10/30/11 Pt's husband says pt is inc of urine constantly. Had some type of surgery years ago that left her unable to tell when she has to use the bathroom and she wears depends adult diapers at home

## 2011-10-30 NOTE — Progress Notes (Signed)
Pt received as transfer from 3100 per bed. Alert family with pt. Placed on tele monitor & oriented to room and nurse call system. Noted new skin tear on Lt FA. PLaced tegederm to area. Marisa Cyphers RN

## 2011-10-30 NOTE — Progress Notes (Signed)
ANTIBIOTIC CONSULT NOTE - FOLLOW UP  Pharmacy Consult for zosyn Indication: perforated viscus  Vital Signs: Temp: 98 F (36.7 C) (05/31 0700) Temp src: Oral (05/31 0700) BP: 172/85 mmHg (05/31 0900) Pulse Rate: 97  (05/31 0900) Intake/Output from previous day: 05/30 0701 - 05/31 0700 In: 1950 [I.V.:1800; IV Piggyback:150] Out: 0  Intake/Output from this shift: Total I/O In: 150 [I.V.:150] Out: -   Labs:  Basename 10/28/11 0337  WBC 20.7*  HGB 10.6*  PLT 365  LABCREA --  CREATININE 0.99   Estimated Creatinine Clearance: 35.2 ml/min (by C-G formula based on Cr of 0.99). No results found for this basename: VANCOTROUGH:2,VANCOPEAK:2,VANCORANDOM:2,GENTTROUGH:2,GENTPEAK:2,GENTRANDOM:2,TOBRATROUGH:2,TOBRAPEAK:2,TOBRARND:2,AMIKACINPEAK:2,AMIKACINTROU:2,AMIKACIN:2, in the last 72 hours   Microbiology: Recent Results (from the past 720 hour(s))  MRSA PCR SCREENING     Status: Normal   Collection Time   10/27/11 12:21 AM      Component Value Range Status Comment   MRSA by PCR NEGATIVE  NEGATIVE  Final     Anti-infectives     Start     Dose/Rate Route Frequency Ordered Stop   10/27/11 0800  piperacillin-tazobactam (ZOSYN) IVPB 3.375 g       3.375 g 12.5 mL/hr over 240 Minutes Intravenous Every 8 hours 10/27/11 0059     10/27/11 0100  piperacillin-tazobactam (ZOSYN) IVPB 3.375 g       3.375 g 100 mL/hr over 30 Minutes Intravenous  Once 10/27/11 0059 10/27/11 0220   10/26/11 2100   ertapenem (INVANZ) 1 g in sodium chloride 0.9 % 50 mL IVPB  Status:  Discontinued        1 g 100 mL/hr over 30 Minutes Intravenous Every 24 hours 10/26/11 2029 10/27/11 1258   10/26/11 2000   metroNIDAZOLE (FLAGYL) IVPB 500 mg  Status:  Discontinued        500 mg 100 mL/hr over 60 Minutes Intravenous  Once 10/26/11 1947 10/26/11 2029   10/26/11 2000   ceFAZolin (ANCEF) IVPB 1 g/50 mL premix  Status:  Discontinued        1 g 100 mL/hr over 30 Minutes Intravenous  Once 10/26/11 1947 10/26/11  2029          Assessment: 83 yof admitted with acute onset of severe abdominal pain, now s/p ex lap and small bowel resection. Today is day #4 of zosyn for empiric treatment of perforated viscus. Pt is afebrile, WBC 20.7 as of 5/29. Dose is appropriate for renal function. Planning for 5 days of antibiotics. Tomorrow should be the last day of therapy.   Plan:  1. Continue zosyn 3.375gm IV Q8H (4 hour infusion) 2. F/u DC of zosyn after tomorrows dose 3. F/u clinical status [place] Goh, Drake Leach 10/30/2011,10:18 AM

## 2011-10-31 LAB — GLUCOSE, CAPILLARY
Glucose-Capillary: 135 mg/dL — ABNORMAL HIGH (ref 70–99)
Glucose-Capillary: 152 mg/dL — ABNORMAL HIGH (ref 70–99)
Glucose-Capillary: 136 mg/dL — ABNORMAL HIGH (ref 70–99)
Glucose-Capillary: 144 mg/dL — ABNORMAL HIGH (ref 70–99)
Glucose-Capillary: 155 mg/dL — ABNORMAL HIGH (ref 70–99)

## 2011-10-31 MED ORDER — LEVOTHYROXINE SODIUM 100 MCG PO TABS
100.0000 ug | ORAL_TABLET | Freq: Every day | ORAL | Status: DC
  Administered 2011-11-01 – 2011-11-04 (×4): 100 ug via ORAL
  Filled 2011-10-31 (×5): qty 1

## 2011-10-31 MED ORDER — DONEPEZIL HCL 10 MG PO TABS
10.0000 mg | ORAL_TABLET | Freq: Every day | ORAL | Status: DC
  Administered 2011-11-01 – 2011-11-04 (×4): 10 mg via ORAL
  Filled 2011-10-31 (×6): qty 1

## 2011-10-31 MED ORDER — PREDNISONE (PAK) 10 MG PO TABS
10.0000 mg | ORAL_TABLET | ORAL | Status: AC
  Administered 2011-10-31: 10 mg via ORAL

## 2011-10-31 MED ORDER — PREDNISONE (PAK) 10 MG PO TABS
10.0000 mg | ORAL_TABLET | Freq: Four times a day (QID) | ORAL | Status: DC
  Administered 2011-11-02 – 2011-11-04 (×8): 10 mg via ORAL

## 2011-10-31 MED ORDER — HYDROCODONE-ACETAMINOPHEN 5-325 MG PO TABS
1.0000 | ORAL_TABLET | ORAL | Status: DC | PRN
  Administered 2011-10-31 – 2011-11-03 (×3): 1 via ORAL
  Filled 2011-10-31 (×3): qty 1

## 2011-10-31 MED ORDER — PREDNISONE (PAK) 10 MG PO TABS
10.0000 mg | ORAL_TABLET | Freq: Three times a day (TID) | ORAL | Status: AC
  Administered 2011-11-01 (×3): 10 mg via ORAL

## 2011-10-31 MED ORDER — DILTIAZEM HCL ER COATED BEADS 180 MG PO CP24
180.0000 mg | ORAL_CAPSULE | Freq: Every day | ORAL | Status: DC
  Administered 2011-10-31 – 2011-11-04 (×5): 180 mg via ORAL
  Filled 2011-10-31 (×9): qty 1

## 2011-10-31 MED ORDER — GABAPENTIN 300 MG PO CAPS
300.0000 mg | ORAL_CAPSULE | Freq: Two times a day (BID) | ORAL | Status: DC
  Administered 2011-10-31 – 2011-11-04 (×9): 300 mg via ORAL
  Filled 2011-10-31 (×10): qty 1

## 2011-10-31 MED ORDER — DULOXETINE HCL 60 MG PO CPEP
60.0000 mg | ORAL_CAPSULE | Freq: Every day | ORAL | Status: DC
  Administered 2011-10-31 – 2011-11-03 (×4): 60 mg via ORAL
  Filled 2011-10-31 (×5): qty 1

## 2011-10-31 MED ORDER — METOPROLOL SUCCINATE ER 100 MG PO TB24
100.0000 mg | ORAL_TABLET | Freq: Every day | ORAL | Status: DC
  Administered 2011-10-31 – 2011-11-02 (×2): 100 mg via ORAL
  Filled 2011-10-31 (×5): qty 1

## 2011-10-31 MED ORDER — PREDNISONE (PAK) 10 MG PO TABS
20.0000 mg | ORAL_TABLET | Freq: Every evening | ORAL | Status: AC
  Administered 2011-10-31: 20 mg via ORAL

## 2011-10-31 MED ORDER — PREDNISONE (PAK) 10 MG PO TABS
20.0000 mg | ORAL_TABLET | Freq: Every morning | ORAL | Status: AC
  Administered 2011-10-31: 20 mg via ORAL
  Filled 2011-10-31: qty 21

## 2011-10-31 MED ORDER — PREDNISONE (PAK) 10 MG PO TABS
20.0000 mg | ORAL_TABLET | Freq: Every evening | ORAL | Status: AC
  Administered 2011-11-01: 20 mg via ORAL

## 2011-10-31 MED ORDER — DOCUSATE SODIUM 100 MG PO CAPS
100.0000 mg | ORAL_CAPSULE | Freq: Two times a day (BID) | ORAL | Status: DC
  Administered 2011-10-31 – 2011-11-03 (×7): 100 mg via ORAL
  Filled 2011-10-31 (×10): qty 1

## 2011-10-31 MED ORDER — PREDNISONE 5 MG PO TABS: 5.0000 mg | ORAL_TABLET | Freq: Every day | ORAL | Status: DC

## 2011-10-31 MED ORDER — FAMOTIDINE 20 MG PO TABS
20.0000 mg | ORAL_TABLET | Freq: Two times a day (BID) | ORAL | Status: DC
  Administered 2011-10-31 – 2011-11-02 (×5): 20 mg via ORAL
  Filled 2011-10-31 (×6): qty 1

## 2011-10-31 NOTE — Progress Notes (Signed)
5 Days Post-Op  Subjective: Tolerating liquids. Denies n/v. +flatus. +bm per pt. Pain ok  Objective: Vital signs in last 24 hours: Temp:  [97.6 F (36.4 C)-98.6 F (37 C)] 97.7 F (36.5 C) (06/01 0425) Pulse Rate:  [80-107] 92  (06/01 0645) Resp:  [18-23] 18  (06/01 0645) BP: (145-183)/(64-96) 145/64 mmHg (06/01 0645) SpO2:  [96 %-100 %] 100 % (06/01 0425) Weight:  [114 lb 3.2 oz (51.8 kg)-115 lb 8.3 oz (52.4 kg)] 114 lb 3.2 oz (51.8 kg) (06/01 0425) Last BM Date: 10/30/11  Intake/Output from previous day: 05/31 0701 - 06/01 0700 In: 1595 [P.O.:120; I.V.:1325; IV Piggyback:150] Out: -  Intake/Output this shift: Total I/O In: 120 [P.O.:120] Out: -   Alert, nad,appropriate cta Reg Soft, mild distension, +BS, incision c/d/i No edema  Lab Results:  No results found for this basename: WBC:2,HGB:2,HCT:2,PLT:2 in the last 72 hours BMET No results found for this basename: NA:2,K:2,CL:2,CO2:2,GLUCOSE:2,BUN:2,CREATININE:2,CALCIUM:2 in the last 72 hours PT/INR No results found for this basename: LABPROT:2,INR:2 in the last 72 hours ABG No results found for this basename: PHART:2,PCO2:2,PO2:2,HCO3:2 in the last 72 hours  Studies/Results: No results found.  Anti-infectives: Anti-infectives     Start     Dose/Rate Route Frequency Ordered Stop   10/27/11 0800   piperacillin-tazobactam (ZOSYN) IVPB 3.375 g        3.375 g 12.5 mL/hr over 240 Minutes Intravenous Every 8 hours 10/27/11 0059     10/27/11 0100   piperacillin-tazobactam (ZOSYN) IVPB 3.375 g        3.375 g 100 mL/hr over 30 Minutes Intravenous  Once 10/27/11 0059 10/27/11 0220   10/26/11 2100   ertapenem (INVANZ) 1 g in sodium chloride 0.9 % 50 mL IVPB  Status:  Discontinued        1 g 100 mL/hr over 30 Minutes Intravenous Every 24 hours 10/26/11 2029 10/27/11 1258   10/26/11 2000   metroNIDAZOLE (FLAGYL) IVPB 500 mg  Status:  Discontinued        500 mg 100 mL/hr over 60 Minutes Intravenous  Once 10/26/11 1947  10/26/11 2029   10/26/11 2000   ceFAZolin (ANCEF) IVPB 1 g/50 mL premix  Status:  Discontinued        1 g 100 mL/hr over 30 Minutes Intravenous  Once 10/26/11 1947 10/26/11 2029          Assessment/Plan: s/p Procedure(s) (LRB): EXPLORATORY LAPAROTOMY (N/A) SMALL BOWEL RESECTION (N/A)  Adv to full liquids Transition back to home meds Will ask pharm to wean iv steroids back to home dose D/c abx after today's dose Cont PT/OT  Mary Sella. Andrey Campanile, MD, FACS General, Bariatric, & Minimally Invasive Surgery [facility], [place]   LOS: 5 days    Atilano Ina 10/31/2011

## 2011-11-01 ENCOUNTER — Other Ambulatory Visit: Payer: Self-pay | Admitting: Internal Medicine

## 2011-11-01 LAB — GLUCOSE, CAPILLARY
Glucose-Capillary: 165 mg/dL — ABNORMAL HIGH (ref 70–99)
Glucose-Capillary: 118 mg/dL — ABNORMAL HIGH (ref 70–99)
Glucose-Capillary: 124 mg/dL — ABNORMAL HIGH (ref 70–99)
Glucose-Capillary: 121 mg/dL — ABNORMAL HIGH (ref 70–99)
Glucose-Capillary: 109 mg/dL — ABNORMAL HIGH (ref 70–99)

## 2011-11-01 MED ORDER — INSULIN ASPART 100 UNIT/ML ~~LOC~~ SOLN
0.0000 [IU] | Freq: Three times a day (TID) | SUBCUTANEOUS | Status: DC
  Administered 2011-11-02: 3 [IU] via SUBCUTANEOUS
  Administered 2011-11-02: 5 [IU] via SUBCUTANEOUS
  Administered 2011-11-02 – 2011-11-03 (×2): 3 [IU] via SUBCUTANEOUS
  Administered 2011-11-03: 2 [IU] via SUBCUTANEOUS
  Administered 2011-11-03: 3 [IU] via SUBCUTANEOUS
  Administered 2011-11-04: 2 [IU] via SUBCUTANEOUS

## 2011-11-01 NOTE — Progress Notes (Signed)
6 Days Post-Op  Subjective: Tolerating diet.  Having BM.  Objective: Vital signs in last 24 hours: Temp:  [97.9 F (36.6 C)-98.7 F (37.1 C)] 97.9 F (36.6 C) (06/02 0412) Pulse Rate:  [63-112] 85  (06/02 0539) Resp:  [19-22] 20  (06/02 0539) BP: (150-167)/(68-84) 156/84 mmHg (06/02 0539) SpO2:  [94 %-95 %] 95 % (06/02 0412) Weight:  [117 lb 8.1 oz (53.3 kg)] 117 lb 8.1 oz (53.3 kg) (06/02 0412) Last BM Date: 10/31/11  Intake/Output from previous day: 06/01 0701 - 06/02 0700 In: 1350 [P.O.:300; I.V.:1000; IV Piggyback:50] Out: -  Intake/Output this shift: Total I/O In: 120 [P.O.:120] Out: -   Incision/Wound: CLEAN DRY INTACT  Lab Results:  No results found for this basename: WBC:2,HGB:2,HCT:2,PLT:2 in the last 72 hours BMET No results found for this basename: NA:2,K:2,CL:2,CO2:2,GLUCOSE:2,BUN:2,CREATININE:2,CALCIUM:2 in the last 72 hours PT/INR No results found for this basename: LABPROT:2,INR:2 in the last 72 hours ABG No results found for this basename: PHART:2,PCO2:2,PO2:2,HCO3:2 in the last 72 hours  Studies/Results: No results found.  Anti-infectives: Anti-infectives     Start     Dose/Rate Route Frequency Ordered Stop   10/27/11 0800   piperacillin-tazobactam (ZOSYN) IVPB 3.375 g        3.375 g 12.5 mL/hr over 240 Minutes Intravenous Every 8 hours 10/27/11 0059 11/01/11 0407   10/27/11 0100   piperacillin-tazobactam (ZOSYN) IVPB 3.375 g        3.375 g 100 mL/hr over 30 Minutes Intravenous  Once 10/27/11 0059 10/27/11 0220   10/26/11 2100   ertapenem (INVANZ) 1 g in sodium chloride 0.9 % 50 mL IVPB  Status:  Discontinued        1 g 100 mL/hr over 30 Minutes Intravenous Every 24 hours 10/26/11 2029 10/27/11 1258   10/26/11 2000   metroNIDAZOLE (FLAGYL) IVPB 500 mg  Status:  Discontinued        500 mg 100 mL/hr over 60 Minutes Intravenous  Once 10/26/11 1947 10/26/11 2029   10/26/11 2000   ceFAZolin (ANCEF) IVPB 1 g/50 mL premix  Status:  Discontinued          1 g 100 mL/hr over 30 Minutes Intravenous  Once 10/26/11 1947 10/26/11 2029          Assessment/Plan: s/p Procedure(s) (LRB): EXPLORATORY LAPAROTOMY (N/A) SMALL BOWEL RESECTION (N/A) SL  IV Change to TID glucose checks PT consult Advance diet  LOS: 6 days    Zoraida Havrilla A. 11/01/2011

## 2011-11-02 DIAGNOSIS — R5381 Other malaise: Secondary | ICD-10-CM

## 2011-11-02 LAB — CREATININE, SERUM
Creatinine, Ser: 1.08 mg/dL (ref 0.50–1.10)
GFR calc non Af Amer: 47 mL/min — ABNORMAL LOW (ref >90)

## 2011-11-02 LAB — GLUCOSE, CAPILLARY
Glucose-Capillary: 231 mg/dL — ABNORMAL HIGH (ref 70–99)
Glucose-Capillary: 108 mg/dL — ABNORMAL HIGH (ref 70–99)

## 2011-11-02 MED ORDER — FAMOTIDINE 20 MG PO TABS
20.0000 mg | ORAL_TABLET | Freq: Two times a day (BID) | ORAL | Status: DC
  Administered 2011-11-03 – 2011-11-04 (×3): 20 mg via ORAL
  Filled 2011-11-02 (×4): qty 1

## 2011-11-02 MED ORDER — ENSURE COMPLETE PO LIQD
237.0000 mL | Freq: Three times a day (TID) | ORAL | Status: DC
  Administered 2011-11-02 – 2011-11-04 (×7): 237 mL via ORAL

## 2011-11-02 MED ORDER — ALPRAZOLAM 0.5 MG PO TABS
0.5000 mg | ORAL_TABLET | Freq: Every day | ORAL | Status: DC
  Administered 2011-11-02 – 2011-11-04 (×3): 0.5 mg via ORAL
  Filled 2011-11-02 (×3): qty 1

## 2011-11-02 MED ORDER — ALPRAZOLAM 0.5 MG PO TABS: 0.5000 mg | ORAL_TABLET | Freq: Two times a day (BID) | ORAL | Status: DC

## 2011-11-02 MED ORDER — COLCHICINE 0.6 MG PO TABS
0.6000 mg | ORAL_TABLET | Freq: Every day | ORAL | Status: DC
  Administered 2011-11-02 – 2011-11-04 (×3): 0.6 mg via ORAL
  Filled 2011-11-02 (×5): qty 1

## 2011-11-02 MED ORDER — METHOTREXATE 2.5 MG PO TABS: 10.0000 mg | ORAL_TABLET | ORAL | Status: DC

## 2011-11-02 MED ORDER — FUROSEMIDE 20 MG PO TABS
20.0000 mg | ORAL_TABLET | Freq: Every day | ORAL | Status: DC
  Administered 2011-11-02 – 2011-11-04 (×3): 20 mg via ORAL
  Filled 2011-11-02 (×4): qty 1

## 2011-11-02 MED ORDER — ALPRAZOLAM 0.5 MG PO TABS
1.0000 mg | ORAL_TABLET | Freq: Every day | ORAL | Status: DC
  Administered 2011-11-02 – 2011-11-03 (×2): 1 mg via ORAL
  Filled 2011-11-02 (×2): qty 2

## 2011-11-02 MED ORDER — THEOPHYLLINE ER 100 MG PO TB12
100.0000 mg | ORAL_TABLET | Freq: Every morning | ORAL | Status: DC
  Administered 2011-11-02 – 2011-11-04 (×3): 100 mg via ORAL
  Filled 2011-11-02 (×3): qty 1

## 2011-11-02 NOTE — Progress Notes (Signed)
Patients HR sustaining in mid fifties when giving night time meds. Dr. Luisa Hart paged to ask about giving Metoprolol. He said to hold tonight. Medication held. Connie Fitzpatrick, Melida Quitter

## 2011-11-02 NOTE — Progress Notes (Signed)
Physical Therapy Treatment Patient Details Name: Connie Fitzpatrick MRN: [id number] DOB: 1927/09/03 Today's Date: 11/02/2011 Time: 4098-1191 PT Time Calculation (min): 15 min  PT Assessment / Plan / Recommendation Comments on Treatment Session  Patient willing to participate but continues to be limited by confusion.  Unaware of place or situation.  Gait unstable and will need 24 hours.  Plan for SNF.      Follow Up Recommendations  Skilled nursing facility;Supervision/Assistance - 24 hour    Barriers to Discharge  None      Equipment Recommendations  Defer to next venue    Recommendations for Other Services OT consult  Frequency Min 3X/week   Plan Discharge plan remains appropriate;Frequency remains appropriate    Precautions / Restrictions Precautions Precautions: Fall Restrictions Weight Bearing Restrictions: No   Pertinent Vitals/Pain VSS/ No pain    Mobility  Bed Mobility Bed Mobility: Supine to Sit;Sitting - Scoot to Edge of Bed Rolling Right: Not tested (comment) Rolling Left: Not tested (comment) Supine to Sit: 4: Min assist;HOB flat Sitting - Scoot to Edge of Bed: 3: Mod assist Details for Bed Mobility Assistance: Patient needed incr time to get to EOB. Transfers Transfers: Sit to Stand;Stand to Sit Sit to Stand: 1: +2 Total assist;With upper extremity assist;From bed Sit to Stand: Patient Percentage: 60% Stand to Sit: 3: Mod assist;With upper extremity assist;With armrests;To chair/3-in-1 Stand Pivot Transfers: Not tested (comment) Details for Transfer Assistance: PAtient needed asssit to initiate sit to stand and vc for hand placement and sequencing.   Ambulation/Gait Ambulation/Gait Assistance: 1: +2 Total assist Ambulation/Gait: Patient Percentage: 60% Ambulation Distance (Feet): 40 Feet Assistive device: Rolling walker Ambulation/Gait Assistance Details: Patient had difficulty with ambulation.  Patient did not swing left LE all the way through during swing phase  as well as her left knee was flexed greater than her right knee.  Patient's RW was too tall - she brought it from home- but it was on lowest adjustment.  Patient's friend in room and to ask husband to bring her RW.  Patient was limited by confusion and did not follow commands and instruction well.   Gait Pattern: Step-to pattern;Decreased stride length;Decreased step length - left;Shuffle;Left flexed knee in stance;Lateral hip instability Gait velocity: slow cadence Stairs: No Wheelchair Mobility Wheelchair Mobility: No    PT Goals Acute Rehab PT Goals PT Goal: Rolling Supine to Right Side - Progress: Progressing toward goal PT Goal: Rolling Supine to Left Side - Progress: Progressing toward goal PT Goal: Supine/Side to Sit - Progress: Progressing toward goal PT Goal: Sit to Supine/Side - Progress: Progressing toward goal PT Goal: Sit to Stand - Progress: Progressing toward goal PT Goal: Stand to Sit - Progress: Progressing toward goal PT Goal: Ambulate - Progress: Progressing toward goal  Visit Information  Last PT Received On: 11/02/11 Assistance Needed: +2    Subjective Data  Subjective: Patient confused.   Cognition  Overall Cognitive Status: Impaired Area of Impairment: Memory;Safety/judgement;Awareness of deficits Arousal/Alertness: Awake/alert Orientation Level: Appears intact for tasks assessed Behavior During Session: [facility] for tasks performed Memory Deficits: Decr recall of orientation Safety/Judgement: Decreased safety judgement for tasks assessed;Decreased awareness of need for assistance    Balance  Static Sitting Balance Static Sitting - Balance Support: Feet supported;Bilateral upper extremity supported Static Sitting - Level of Assistance: 6: Modified independent (Device/Increase time) Static Sitting - Comment/# of Minutes: 2  End of Session PT - End of Session Equipment Utilized During Treatment: Gait belt Activity Tolerance: Patient tolerated treatment  well  Patient left: in bed;with call bell/phone within reach;with family/visitor present Nurse Communication: Mobility status    Connie Fitzpatrick,Connie Fitzpatrick 11/02/2011, 3:12 PM  [facility] Acute Rehabilitation [phone] (pager)

## 2011-11-02 NOTE — Progress Notes (Signed)
I have personally interviewed and examined this patient and discussed her care plan with her husband and with her. I agree with the evaluation and management plans that were outlined by Barnetta Chapel, PA.  The patient's abdomen is soft and nontender and the wound is healing well. We will leave the staples in for a few more days  For now the focus is going to be on Nutritional support, encouraging increased nutritional intake, weaning steroids, PT, and disposition planning.   Angelia Mould. Derrell Lolling, M.D., [facility] General and Minimally invasive Surgery Breast and Colorectal Surgery Office:   [phone] Pager:   [phone] .

## 2011-11-02 NOTE — Progress Notes (Signed)
Patient ID: Connie Fitzpatrick, female   DOB: Oct 30, 1927, 78 y.o.   MRN: [id number] 7 Days Post-Op  Subjective: Pt feels ok.  Tolerating about 25% of her meals.  Will add ensure between meals.  No nausea.  Having multiple BMs.  Minimal pain, but having difficulty mobilizing given severe arthritis and deconditioning.  Objective: Vital signs in last 24 hours: Temp:  [97.2 F (36.2 C)-98.1 F (36.7 C)] 98.1 F (36.7 C) (06/03 0500) Pulse Rate:  [51-59] 59  (06/03 0500) Resp:  [18-22] 18  (06/03 0500) BP: (128-169)/(52-71) 130/60 mmHg (06/03 0857) SpO2:  [94 %-97 %] 94 % (06/03 0500) Weight:  [117 lb 15.1 oz (53.5 kg)] 117 lb 15.1 oz (53.5 kg) (06/03 0500) Last BM Date: 11/01/11  Intake/Output from previous day: 06/02 0701 - 06/03 0700 In: 335 [P.O.:335] Out: 450 [Urine:450] Intake/Output this shift: Total I/O In: 240 [P.O.:240] Out: -   PE: Abd: soft, minimally tender, +BS, ND, staples intact Heart: regular Lungs: CTAB  Lab Results:  No results found for this basename: WBC:2,HGB:2,HCT:2,PLT:2 in the last 72 hours BMET  Basename 11/02/11 0626  NA --  K --  CL --  CO2 --  GLUCOSE --  BUN --  CREATININE 1.08  CALCIUM --   PT/INR No results found for this basename: LABPROT:2,INR:2 in the last 72 hours CMP     Component Value Date/Time   NA 141 10/28/2011 0337   K 4.2 10/28/2011 0337   CL 109 10/28/2011 0337   CO2 23 10/28/2011 0337   GLUCOSE 155* 10/28/2011 0337   BUN 16 10/28/2011 0337   CREATININE 1.08 11/02/2011 0626   CALCIUM 9.0 10/28/2011 0337   PROT 6.5 10/26/2011 1617   ALBUMIN 3.1* 10/26/2011 1617   AST 18 10/26/2011 1617   ALT 9 10/26/2011 1617   ALKPHOS 72 10/26/2011 1617   BILITOT 0.3 10/26/2011 1617   GFRNONAA 47* 11/02/2011 0626   GFRAA 54* 11/02/2011 0626   Lipase     Component Value Date/Time   LIPASE 18 10/26/2011 1617       Studies/Results: No results found.  Anti-infectives: Anti-infectives     Start     Dose/Rate Route Frequency Ordered Stop   10/27/11 0800  piperacillin-tazobactam (ZOSYN) IVPB 3.375 g       3.375 g 12.5 mL/hr over 240 Minutes Intravenous Every 8 hours 10/27/11 0059 11/01/11 0407   10/27/11 0100  piperacillin-tazobactam (ZOSYN) IVPB 3.375 g       3.375 g 100 mL/hr over 30 Minutes Intravenous  Once 10/27/11 0059 10/27/11 0220   10/26/11 2100   ertapenem (INVANZ) 1 g in sodium chloride 0.9 % 50 mL IVPB  Status:  Discontinued        1 g 100 mL/hr over 30 Minutes Intravenous Every 24 hours 10/26/11 2029 10/27/11 1258   10/26/11 2000   metroNIDAZOLE (FLAGYL) IVPB 500 mg  Status:  Discontinued        500 mg 100 mL/hr over 60 Minutes Intravenous  Once 10/26/11 1947 10/26/11 2029   10/26/11 2000   ceFAZolin (ANCEF) IVPB 1 g/50 mL premix  Status:  Discontinued        1 g 100 mL/hr over 30 Minutes Intravenous  Once 10/26/11 1947 10/26/11 2029           Assessment/Plan  1. S/p ex lap with SBR secondary to FB perf 2. Deconditioning 3. Multiple medical problems  Plan: 1. D/w pt and husband.  Will have social work see patient to set up  SNF placement 2. Add ensure for decreased PO intake 3. Resume most home meds 4. Wean steroids per pharmacy 5. Should be stable for dc wtihin next 1-2 days once placement found.   LOS: 7 days    Navdeep Halt E 11/02/2011

## 2011-11-02 NOTE — Progress Notes (Signed)
Utilization review completed.  

## 2011-11-02 NOTE — Progress Notes (Signed)
Clinical Social Work Department  CLINICAL SOCIAL WORK PLACEMENT NOTE  10/29/2011  Patient: Connie Fitzpatrick, Connie Fitzpatrick Account Number: [id number]  Admit date: 10/26/2011  Clinical Social Worker: Margaree Mackintosh Date/time: 10/29/2011 02:22 PM  Clinical Social Work is seeking post-discharge placement for this patient at the following level of care: SKILLED NURSING (*CSW will update this form in Epic as items are completed)  10/29/2011 Patient/family provided with [facility] Department of Clinical Social Work's list of facilities offering this level of care within the geographic area requested by the patient (or if unable, by the patient's family).  10/29/2011 Patient/family informed of their freedom to choose among providers that offer the needed level of care, that participate in Medicare, Medicaid or managed care program needed by the patient, have an available bed and are willing to accept the patient.  10/29/2011 Patient/family informed of [facility]' ownership interest in [facility], as well as of the fact that they are under no obligation to receive care at this facility.  PASARR submitted to EDS on 10/29/2011  PASARR number received from EDS on 10/29/2011  FL2 transmitted to all facilities in geographic area requested by pt/family on 10/29/2011  FL2 transmitted to all facilities within larger geographic area on  Patient informed that his/her managed care company has contracts with or will negotiate with certain facilities, including the following:  Patient/family informed of bed offers received: 11/02/11 Patient chooses bed at - patient plans to discuss with her husband this evening Physician recommends and patient chooses bed at  Patient to be transferred to on  Patient to be transferred to facility by  The following physician request were entered in Epic:  Additional Comments:   Sabino Niemann, MSW, LCSWA [phone]

## 2011-11-03 LAB — GLUCOSE, CAPILLARY: Glucose-Capillary: 195 mg/dL — ABNORMAL HIGH (ref 70–99)

## 2011-11-03 MED ORDER — POLYETHYLENE GLYCOL 3350 17 G PO PACK
17.0000 g | PACK | Freq: Once | ORAL | Status: AC
  Administered 2011-11-03: 17 g via ORAL
  Filled 2011-11-03: qty 1

## 2011-11-03 MED ORDER — HYDRALAZINE HCL 10 MG PO TABS
10.0000 mg | ORAL_TABLET | Freq: Four times a day (QID) | ORAL | Status: DC | PRN
  Filled 2011-11-03: qty 1

## 2011-11-03 NOTE — Progress Notes (Signed)
Spoke with patient husband regarding bed offers and placement at dc.  Husband was given offers and has chosen [place] as his facility of choice.  [facility], confirmed bed offer and agreeable to dc plan and bed offer.  Anticipating dc on 6/5 per husband's report from MD. CSW to help facilitate discharge and placement in SNF.  Will continue to follow and assist with needs.   Please MD: Sign FL2 for SNF placement.    Ashley Jacobs, MSW LCSW [phone]

## 2011-11-03 NOTE — Progress Notes (Signed)
Patient interviewed and examined. Agree with plans as outlined.  Slow to get out of bed. Having bowel movements but complains about chronic abdominal soreness and constipation and gassiness.. No nausea or vomiting.  Abdomen soft, wound OK, slightly distended. Benign.  Plan: Try miralax Transfer SNF [facility]) Follow up with Dr. Corliss Skains as outpatient. Staples out tomorrow (POD#9) prior to discharge   Angelia Mould. Derrell Lolling, M.D., [facility] General and Minimally invasive Surgery Breast and Colorectal Surgery Office:   [phone] Pager:   [phone]

## 2011-11-03 NOTE — Progress Notes (Signed)
8 Days Post-Op  Subjective: Resting well in bed, no new complaints  Objective: Vital signs in last 24 hours: Temp:  [97.3 F (36.3 C)-98.3 F (36.8 C)] 97.3 F (36.3 C) (06/04 1035) Pulse Rate:  [51-70] 51  (06/04 1035) Resp:  [18] 18  (06/04 1035) BP: (138-160)/(60-72) 155/72 mmHg (06/04 1035) SpO2:  [94 %-97 %] 97 % (06/04 1035) Weight:  [118 lb 4.8 oz (53.661 kg)] 118 lb 4.8 oz (53.661 kg) (06/04 0512) Last BM Date: 11/03/11  Intake/Output from previous day: 06/03 0701 - 06/04 0700 In: 600 [P.O.:600] Out: 402 [Urine:400; Stool:2] Intake/Output this shift: Total I/O In: 120 [P.O.:120] Out: 325 [Urine:325]  Incision/Wound: staples remain intact, wound edges appear to be well approximated. No erythema or drainage from wound noted.  Abdomen; soft, non-tender to palpation, positive bowel sounds, passing flatus and stool without discomfort. Tolerating regular diet.  Lab Results:  No results found for this basename: WBC:2,HGB:2,HCT:2,PLT:2 in the last 72 hours BMET  [facility] 11/02/11 0626  NA --  K --  CL --  CO2 --  GLUCOSE --  BUN --  CREATININE 1.08  CALCIUM --   PT/INR No results found for this basename: LABPROT:2,INR:2 in the last 72 hours ABG No results found for this basename: PHART:2,PCO2:2,PO2:2,HCO3:2 in the last 72 hours  Studies/Results: No results found.  Anti-infectives: Anti-infectives     Start     Dose/Rate Route Frequency Ordered Stop   10/27/11 0800   piperacillin-tazobactam (ZOSYN) IVPB 3.375 g        3.375 g 12.5 mL/hr over 240 Minutes Intravenous Every 8 hours 10/27/11 0059 11/01/11 0407   10/27/11 0100   piperacillin-tazobactam (ZOSYN) IVPB 3.375 g        3.375 g 100 mL/hr over 30 Minutes Intravenous  Once 10/27/11 0059 10/27/11 0220   10/26/11 2100   ertapenem (INVANZ) 1 g in sodium chloride 0.9 % 50 mL IVPB  Status:  Discontinued        1 g 100 mL/hr over 30 Minutes Intravenous Every 24 hours 10/26/11 2029 10/27/11 1258   10/26/11  2000   metroNIDAZOLE (FLAGYL) IVPB 500 mg  Status:  Discontinued        500 mg 100 mL/hr over 60 Minutes Intravenous  Once 10/26/11 1947 10/26/11 2029   10/26/11 2000   ceFAZolin (ANCEF) IVPB 1 g/50 mL premix  Status:  Discontinued        1 g 100 mL/hr over 30 Minutes Intravenous  Once 10/26/11 1947 10/26/11 2029          Assessment/Plan: s/p Procedure(s) (LRB): EXPLORATORY LAPAROTOMY (N/A) SMALL BOWEL RESECTION (N/A) Plan:  1. Continue with regular diet as tolerated, continue with ensure for additional caloric intake. 2. Continue to get patient OOB TID 3. Await SNF placement.   LOS: 8 days    Connie Fitzpatrick 11/03/2011

## 2011-11-03 NOTE — Progress Notes (Signed)
Patient has orders to ambulate in hallway; patient is very weak in BLE and can barely get to the bedside commode; patient unable to ambulate hallway_____________________________________________________D. Manson Passey RN

## 2011-11-03 NOTE — Progress Notes (Signed)
Nutrition Follow-up S/P exploratory lap and small bowel resection.  NGT for suction d/c'd on 5/31, ileus improving. Awaiting SNF placement.  Ensure added between meals for poor po intake.  Diet Order:  Carb Mod medium, PO intake variable 15-75%.  Pt reports appetite is improving and she is drinking Ensure as able.   Meds: Scheduled Meds:   . ALPRAZolam  0.5 mg Oral Daily  . ALPRAZolam  1 mg Oral QHS  . colchicine  0.6 mg Oral Q breakfast  . diltiazem  180 mg Oral Q breakfast  . docusate sodium  100 mg Oral BID  . donepezil  10 mg Oral Q breakfast  . DULoxetine  60 mg Oral QHS  . enoxaparin  30 mg Subcutaneous Q24H  . famotidine  20 mg Oral BID  . feeding supplement  237 mL Oral TID BM  . furosemide  20 mg Oral Daily  . gabapentin  300 mg Oral BID  . insulin aspart  0-15 Units Subcutaneous TID WC  . levothyroxine  100 mcg Oral Q breakfast  . methotrexate  10 mg Oral Q Sat-1800  . metoprolol succinate  100 mg Oral QHS  . predniSONE  5 mg Oral Q breakfast  . predniSONE  10 mg Oral 4X daily taper  . theophylline  100 mg Oral q morning - 10a   Continuous Infusions:  PRN Meds:.albuterol, fentaNYL, hydrALAZINE, HYDROcodone-acetaminophen, metoprolol, ondansetron (ZOFRAN) IV, ondansetron  Labs:  CMP     Component Value Date/Time   NA 141 10/28/2011 0337   K 4.2 10/28/2011 0337   CL 109 10/28/2011 0337   CO2 23 10/28/2011 0337   GLUCOSE 155* 10/28/2011 0337   BUN 16 10/28/2011 0337   CREATININE 1.08 11/02/2011 0626   CALCIUM 9.0 10/28/2011 0337   PROT 6.5 10/26/2011 1617   ALBUMIN 3.1* 10/26/2011 1617   AST 18 10/26/2011 1617   ALT 9 10/26/2011 1617   ALKPHOS 72 10/26/2011 1617   BILITOT 0.3 10/26/2011 1617   GFRNONAA 47* 11/02/2011 0626   GFRAA 54* 11/02/2011 0626     Intake/Output Summary (Last 24 hours) at 11/03/11 1148 Last data filed at 11/03/11 0850  Gross per 24 hour  Intake    480 ml  Output    626 ml  Net   -146 ml    Weight Status:  118 lbs, trending down. Per pt report UBW  is 110 lbs.   Re-estimated needs: 1400-1660 kcal, 75-90 gm protein, > 1.5 L fluids  Nutrition Dx:  Inadequate oral intake now r/t poor appetite AEB intake documentation 25-75% and pt report  Goal:  Provide >/=90% of estimated needs, not met New Goal: PO intake of meals and supplements to be >75%  Intervention:   No new nutrition interventions, continue with Ensure between meals.   Monitor:  PO intake, weight, labs, I/O's   Rudean Haskell Pager #:  [phone]

## 2011-11-04 LAB — GLUCOSE, CAPILLARY: Glucose-Capillary: 119 mg/dL — ABNORMAL HIGH (ref 70–99)

## 2011-11-04 MED ORDER — PREDNISONE 5 MG PO TABS: 5.0000 mg | ORAL_TABLET | Freq: Every day | ORAL | Status: DC

## 2011-11-04 MED ORDER — AZELASTINE HCL 0.1 % NA SOLN: 2.0000 | Freq: Two times a day (BID) | NASAL | Status: AC

## 2011-11-04 MED ORDER — SIMETHICONE 80 MG PO CHEW
80.0000 mg | CHEWABLE_TABLET | Freq: Three times a day (TID) | ORAL | Status: DC | PRN
  Administered 2011-11-04: 80 mg via ORAL
  Filled 2011-11-04 (×2): qty 1

## 2011-11-04 MED ORDER — FAMOTIDINE 20 MG PO TABS
20.0000 mg | ORAL_TABLET | Freq: Two times a day (BID) | ORAL | Status: DC
  Filled 2011-11-04: qty 1

## 2011-11-04 NOTE — Progress Notes (Signed)
PT Cancellation Note  Treatment cancelled today due to medical issues with patient which prohibited therapy. Attempted to see pt 3 times today.  Each time pt was actively vomiting and unable to participate in PT.  Received order for D/C PT this afternoon.  Will check with RN/MD to confirm order.   Marquez Ceesay 11/04/2011, 2:21 PM

## 2011-11-04 NOTE — Discharge Summary (Signed)
I agree with the discharge summary, that was dictated by Hinda Glatter, nurse practitioner.  Angelia Mould. Derrell Lolling, M.D., [facility] General and Minimally invasive Surgery Breast and Colorectal Surgery Office:   [phone] Pager:   [phone]

## 2011-11-04 NOTE — Progress Notes (Signed)
9 Days Post-Op  Subjective: Doing well, no abdominal discomfort reported. Sitting upright in bed, alert orientated, pleasant. Nurses report several bowel movements, no reports of nausea,emesis.  Objective: Vital signs in last 24 hours: Temp:  [97.3 F (36.3 C)-98.3 F (36.8 C)] 97.3 F (36.3 C) (06/05 0434) Pulse Rate:  [53-61] 61  (06/05 0434) Resp:  [18-20] 18  (06/05 0434) BP: (138-154)/(56-69) 143/66 mmHg (06/05 0434) SpO2:  [94 %-96 %] 94 % (06/05 0434) Weight:  [117 lb 11.2 oz (53.388 kg)] 117 lb 11.2 oz (53.388 kg) (06/05 0434) Last BM Date: 11/03/11  Intake/Output from previous day: 06/04 0701 - 06/05 0700 In: 837 [P.O.:837] Out: 1025 [Urine:1025] Intake/Output this shift:    General appearance: alert, cooperative and no distress Incision/Wound: staples remain intact, wound edges appear to be well approximated. No erythema or drainage from wound noted.  Abdomen; soft, non-tender to palpation, positive bowel sounds, passing flatus and stool without discomfort. Tolerating regular diet.    Lab Results:  No results found for this basename: WBC:2,HGB:2,HCT:2,PLT:2 in the last 72 hours BMET  [facility] 11/02/11 0626  NA --  K --  CL --  CO2 --  GLUCOSE --  BUN --  CREATININE 1.08  CALCIUM --   PT/INR No results found for this basename: LABPROT:2,INR:2 in the last 72 hours ABG No results found for this basename: PHART:2,PCO2:2,PO2:2,HCO3:2 in the last 72 hours  Studies/Results: No results found.  Anti-infectives: Anti-infectives     Start     Dose/Rate Route Frequency Ordered Stop   10/27/11 0800  piperacillin-tazobactam (ZOSYN) IVPB 3.375 g       3.375 g 12.5 mL/hr over 240 Minutes Intravenous Every 8 hours 10/27/11 0059 11/01/11 0407   10/27/11 0100  piperacillin-tazobactam (ZOSYN) IVPB 3.375 g       3.375 g 100 mL/hr over 30 Minutes Intravenous  Once 10/27/11 0059 10/27/11 0220   10/26/11 2100   ertapenem (INVANZ) 1 g in sodium chloride 0.9 % 50 mL  IVPB  Status:  Discontinued        1 g 100 mL/hr over 30 Minutes Intravenous Every 24 hours 10/26/11 2029 10/27/11 1258   10/26/11 2000   metroNIDAZOLE (FLAGYL) IVPB 500 mg  Status:  Discontinued        500 mg 100 mL/hr over 60 Minutes Intravenous  Once 10/26/11 1947 10/26/11 2029   10/26/11 2000   ceFAZolin (ANCEF) IVPB 1 g/50 mL premix  Status:  Discontinued        1 g 100 mL/hr over 30 Minutes Intravenous  Once 10/26/11 1947 10/26/11 2029          Assessment/Plan: s/p Procedure(s) (LRB): EXPLORATORY LAPAROTOMY (N/A) SMALL BOWEL RESECTION (N/A  Plan:  1. Continue with regular diet as tolerated, continue with ensure for additional caloric intake.  2. Continue to get patient OOB TID  3.Probable discharge to SNF today.    LOS: 9 days    Connie Fitzpatrick 11/04/2011

## 2011-11-04 NOTE — Discharge Summary (Signed)
Physician Discharge Summary  Patient ID: Connie Fitzpatrick MRN: [id number] DOB/AGE: 76-Aug-1929 76 y.o.  Admit date: 10/26/2011 Discharge date: 11/04/2011  Admission Diagnoses:Abdominal pain   Discharge Diagnoses: Status post exploratory laparotomy, with small bowel resection.    Active Problems:  Acute respiratory failure  Acute encephalopathy  Perforated viscus  Peritonitis (acute) generalized  Adrenal insufficiency, primary, iatrogenic  Acute renal failure   Discharged Condition: stable Hospital Course: 78 yo female with dementia and significant other medical problems presented emergently to the ED with acute onset of severe abdominal pain. She apparently had a bowel movement earlier that day - non-bloody. Pain was reported as focused in the RLQ, with guarding. She was hemodynamically stable. She had a CT scan which showed free air with significant stranding in the small bowel mesentery. She underwent an exploratory laparotomy with subsequent small bowel resection. She was admitted to the intensive care unit post-operativly, then later transferred to a step down unit. She has been treated with IV antibiotics, bowel rest, and later transitioned back to a regular diet.She has also received physical therapy, and dietary consults during her stay.   Consults: General surgery, physical therapy, dietary  Significant Diagnostic Studies: labs: and radiology: CT scan:   Treatments: IV hydration, antibiotics: Ancef, Zosyn and metronidazole,Invanzez, analgesia: Morphine, anticoagulation: LMW heparin, insulin: Aspart, respiratory therapy: Incentive spirometry, and surgery: exploratory laparotomy with small bowel resection. Discharge Exam: Blood pressure 143/66, pulse 61, temperature 97.3 F (36.3 C), temperature source Oral, resp. rate 18, height 5\' 2"  (1.575 m), weight 117 lb 11.2 oz (53.388 kg), SpO2 94.00%. General appearance: alert, cooperative and no distress Head: Normocephalic, without obvious  abnormality, atraumatic Eyes: conjunctivae/corneas clear. PERRL, EOM's intact. Fundi benign. Resp: clear to auscultation bilaterally Cardio: regular rate and rhythm, S1, S2 normal, no murmur, click, rub or gallop GI: soft, non-tender; bowel sounds normal; no masses,  no organomegaly Incision/Wound:staples C/D/I, wound edges appear well approximated, no erythema,or drainage.  Disposition: 03-Skilled Nursing Facility  Discharge Orders    Future Appointments: Provider: Department: Dept Phone: Center:   11/05/2011 1:00 PM [facility] [phone] [facility]     Medication List  As of 11/04/2011  2:26 PM   STOP taking these medications         traMADol 50 MG tablet         TAKE these medications         acetaminophen 500 MG tablet   Commonly known as: TYLENOL   Take 500 mg by mouth every 6 (six) hours as needed. For pain/fever      ALPRAZolam 0.5 MG tablet   Commonly known as: XANAX   Take 0.5-1 mg by mouth 2 (two) times daily. Take 1 in the morning and 2 in the evening      azelastine 137 MCG/SPRAY nasal spray   Commonly known as: ASTELIN   Place 2 sprays into the nose 2 (two) times daily. Use in each nostril as directed      Biotin 300 MCG Tabs   Take 300 mcg by mouth at bedtime.      bisacodyl 5 MG EC tablet   Commonly known as: DULCOLAX   Take 5 mg by mouth at bedtime.      CALTRATE 600 PLUS-VIT D PO   Take 1 tablet by mouth every evening.      colchicine 0.6 MG tablet   Take 0.6 mg by mouth daily with breakfast.      CoQ10 100 MG Caps   Take 100  mg by mouth every evening.      cyanocobalamin 1000 MCG tablet   Take 100 mcg by mouth every evening.      diltiazem 180 MG 24 hr capsule   Commonly known as: CARDIZEM CD   Take 180 mg by mouth daily with breakfast.      docusate sodium 100 MG capsule   Commonly known as: COLACE   Take 200 mg by mouth at bedtime as needed. For constipation      donepezil 10 MG tablet   Commonly known as: ARICEPT    Take 10 mg by mouth daily with breakfast.      DULoxetine 60 MG capsule   Commonly known as: CYMBALTA   Take 60 mg by mouth at bedtime.      febuxostat 40 MG tablet   Commonly known as: ULORIC   Take 40 mg by mouth daily with breakfast.      folic acid 1 MG tablet   Commonly known as: FOLVITE   Take 1 mg by mouth daily with breakfast.      furosemide 40 MG tablet   Commonly known as: LASIX   Take 20 mg by mouth daily.      gabapentin 300 MG capsule   Commonly known as: NEURONTIN   Take 300 mg by mouth 2 (two) times daily.      HYDROcodone-acetaminophen 10-325 MG per tablet   Commonly known as: NORCO   Take 2 tablets by mouth 2 (two) times daily as needed. For pain      ipratropium 0.06 % nasal spray   Commonly known as: ATROVENT   Place 2 sprays into the nose as needed. For rhinorrhea      JOINT SUPPORT PO   Take 2 tablets by mouth every evening.      SYNTHROID 100 MCG tablet   Generic drug: levothyroxine   TAKE 1 TABLET BY MOUTH EVERY DAY      levothyroxine 100 MCG tablet   Commonly known as: SYNTHROID, LEVOTHROID   Take 100 mcg by mouth daily with breakfast.      loratadine-pseudoephedrine 5-120 MG per tablet   Commonly known as: CLARITIN-D 12-hour   Take 1 tablet by mouth 2 (two) times daily.      Melatonin 5 MG Caps   Take 5 mg by mouth at bedtime.      methotrexate 2.5 MG tablet   Commonly known as: RHEUMATREX   Take 10 mg by mouth once a week. Caution:Chemotherapy. Protect from light.      metoprolol succinate 100 MG 24 hr tablet   Commonly known as: TOPROL-XL   Take 100 mg by mouth at bedtime. Take with or immediately following a meal.      mulitivitamin with minerals Tabs   Take 1 tablet by mouth at bedtime.      multivitamin-lutein Caps   Take 1 capsule by mouth daily with breakfast.      Omega-3 Fatty Acids 300 MG Caps   Take 2 capsules by mouth every evening.      potassium chloride 10 MEQ tablet   Commonly known as: K-DUR   Take 5 mEq  by mouth daily with breakfast.      prednisoLONE acetate 1 % ophthalmic suspension   Commonly known as: PRED FORTE   Place 1 drop into both eyes 2 (two) times daily as needed. For eye allergies      predniSONE 5 MG tablet   Commonly known as: DELTASONE   Take 5  mg by mouth daily.      predniSONE 5 MG tablet   Commonly known as: DELTASONE   Take 1 tablet (5 mg total) by mouth daily.      PROVENTIL HFA 108 (90 BASE) MCG/ACT inhaler   Generic drug: albuterol   Inhale 2 puffs into the lungs every 4 (four) hours as needed. For shortness of breath      ranitidine 150 MG tablet   Commonly known as: ZANTAC   Take 150 mg by mouth every morning.      Selenium 100 MCG Caps   Take 100 mcg by mouth at bedtime.      theophylline 200 MG 12 hr tablet   Commonly known as: THEODUR   Take 200 mg by mouth at bedtime.      theophylline 200 MG 12 hr tablet   Commonly known as: THEODUR   TAKE 1 TABLET BY MOUTH EVERY DAY AT BEDTIME      theophylline 100 MG 12 hr tablet   Commonly known as: THEODUR   Take 100 mg by mouth every morning.      thiamine 100 MG tablet   Commonly known as: VITAMIN B-1   Take 100 mg by mouth every evening.      VENASTAT 300 MG Cpcr   Generic drug: Horse Chestnut   Take 300 mg by mouth every evening.      Vitamin D (Ergocalciferol) 50000 UNITS Caps   Commonly known as: DRISDOL   Take 50,000 Units by mouth every 30 (thirty) days.      vitamin E 400 UNIT capsule   Take 400 Units by mouth every evening.             SignedBlenda Mounts 11/04/2011, 1:00 PM

## 2011-11-04 NOTE — Progress Notes (Signed)
Pt dressed in paper gown - has all belongings including personal walker which husband took with him. Husband aware of transfer. Ambulance transferred pt to [facility] nursing center. Packet of pt info given to attendants. Called report to [facility].  Marisa Cyphers RN

## 2011-11-04 NOTE — Progress Notes (Signed)
Patient heart rate sustaining in the mid fifties ranging from 54-58. Metoprolol 100mg  scheduled to be given. Dr. Janee Morn notified. Orders given to hold tonight's dose of Metoprolol and to give PRN hydralazine for SBP >180.  Will continue to monitor.

## 2011-11-05 ENCOUNTER — Encounter (HOSPITAL_BASED_OUTPATIENT_CLINIC_OR_DEPARTMENT_OTHER): Payer: Medicare Other | Attending: Internal Medicine

## 2011-11-05 DIAGNOSIS — I872 Venous insufficiency (chronic) (peripheral): Secondary | ICD-10-CM | POA: Insufficient documentation

## 2011-11-05 DIAGNOSIS — M069 Rheumatoid arthritis, unspecified: Secondary | ICD-10-CM | POA: Insufficient documentation

## 2011-11-05 DIAGNOSIS — L97809 Non-pressure chronic ulcer of other part of unspecified lower leg with unspecified severity: Secondary | ICD-10-CM | POA: Insufficient documentation

## 2011-11-05 DIAGNOSIS — IMO0002 Reserved for concepts with insufficient information to code with codable children: Secondary | ICD-10-CM | POA: Insufficient documentation

## 2011-11-06 ENCOUNTER — Encounter (HOSPITAL_BASED_OUTPATIENT_CLINIC_OR_DEPARTMENT_OTHER): Payer: Medicare Other

## 2011-11-27 ENCOUNTER — Emergency Department (HOSPITAL_COMMUNITY)
Admission: EM | Admit: 2011-11-27 | Discharge: 2011-11-27 | Disposition: A | Discharge status: Rehabilitation Facility | Payer: Medicare Other | Attending: Emergency Medicine | Admitting: Emergency Medicine

## 2011-11-27 ENCOUNTER — Emergency Department (HOSPITAL_COMMUNITY): Payer: Medicare Other

## 2011-11-27 ENCOUNTER — Encounter (HOSPITAL_COMMUNITY): Payer: Self-pay | Admitting: Emergency Medicine

## 2011-11-27 DIAGNOSIS — I10 Essential (primary) hypertension: Secondary | ICD-10-CM | POA: Insufficient documentation

## 2011-11-27 DIAGNOSIS — IMO0002 Reserved for concepts with insufficient information to code with codable children: Secondary | ICD-10-CM

## 2011-11-27 DIAGNOSIS — S0180XA Unspecified open wound of other part of head, initial encounter: Secondary | ICD-10-CM | POA: Insufficient documentation

## 2011-11-27 DIAGNOSIS — Z79899 Other long term (current) drug therapy: Secondary | ICD-10-CM | POA: Insufficient documentation

## 2011-11-27 DIAGNOSIS — S0990XA Unspecified injury of head, initial encounter: Secondary | ICD-10-CM | POA: Insufficient documentation

## 2011-11-27 DIAGNOSIS — W050XXA Fall from non-moving wheelchair, initial encounter: Secondary | ICD-10-CM | POA: Insufficient documentation

## 2011-11-27 DIAGNOSIS — Y9210 Unspecified residential institution as the place of occurrence of the external cause: Secondary | ICD-10-CM | POA: Insufficient documentation

## 2011-11-27 DIAGNOSIS — R51 Headache: Secondary | ICD-10-CM | POA: Insufficient documentation

## 2011-11-27 DIAGNOSIS — J45909 Unspecified asthma, uncomplicated: Secondary | ICD-10-CM | POA: Insufficient documentation

## 2011-11-27 DIAGNOSIS — K219 Gastro-esophageal reflux disease without esophagitis: Secondary | ICD-10-CM | POA: Insufficient documentation

## 2011-11-27 DIAGNOSIS — E785 Hyperlipidemia, unspecified: Secondary | ICD-10-CM | POA: Insufficient documentation

## 2011-11-27 DIAGNOSIS — Z96659 Presence of unspecified artificial knee joint: Secondary | ICD-10-CM | POA: Insufficient documentation

## 2011-11-27 MED ORDER — TETANUS-DIPHTH-ACELL PERTUSSIS 5-2.5-18.5 LF-MCG/0.5 IM SUSP
0.5000 mL | Freq: Once | INTRAMUSCULAR | Status: AC
  Administered 2011-11-27: 0.5 mL via INTRAMUSCULAR
  Filled 2011-11-27: qty 0.5

## 2011-11-27 NOTE — ED Notes (Signed)
Pt. Arrived by ems. Pt. From rehab facility, leaning over in wheelchair to pick something off floor and fell. Pt. Has laceration over left eye and ecchymosis to the left eye as well. Pt. Has old bruise on right elbow but has new skin tear on the elbow as well. Pt. C.o of headache. No LOC. Denies vision changes, NV.

## 2011-11-27 NOTE — ED Notes (Signed)
Skin tear cleaned and dressed with bacitracin, ster. 4x4, and curlex

## 2011-11-27 NOTE — Discharge Instructions (Signed)
Please read and follow all provided instructions.  Your diagnoses today include:  1. Laceration   2. Head injury     Tests performed today include:  CT scan of your head that did not show any serious injury.  Vital signs. See below for your results today.   Medications prescribed:   None  Home care instructions:  Follow any educational materials contained in this packet.  Do not take any medications containing aspirin for one week as this can interfere with your body's ability to clot.   Follow-up instructions: Please follow-up with your primary care provider in the next 3 days for further evaluation of your symptoms. If you do not have a primary care doctor -- see below for referral information.   You will need to see your doctor in 5-7 days to have the sutures removed.   Return instructions:  SEEK IMMEDIATE MEDICAL ATTENTION IF:  There is confusion or drowsiness (although children frequently become drowsy after injury).   You cannot awaken the injured person.   You have more than one episode of vomiting.   You notice dizziness or unsteadiness which is getting worse, or inability to walk.   You have convulsions or unconsciousness.   You experience severe, persistent headaches not relieved by Tylenol.  You cannot use arms or legs normally.   There are changes in pupil sizes. (This is the black center in the colored part of the eye)   There is clear or bloody discharge from the nose or ears.   You have change in speech, vision, swallowing, or understanding.   Localized weakness, numbness, tingling, or change in bowel or bladder control.  You have any other emergent concerns.  Additional Information: You have had a head injury which does not appear to require admission at this time. A concussion is a state of changed mental ability from trauma.  Your vital signs today were: BP 119/63  Pulse 70  Temp 97.6 F (36.4 C) (Oral)  Resp 20  SpO2 98% If your blood  pressure (BP) was elevated above 135/85 this visit, please have this repeated by your doctor within one month. -------------- No Primary Care Doctor Call [facility]  [phone] Other agencies that provide inexpensive medical care    [facility]  [phone]    [facility]  [phone]    [facility]  [phone]    [facility]  [phone]    [facility]  [phone]    [facility]  [phone] -------------- RESOURCE GUIDE:  Dental Problems  Patients with Medicaid: [facility] [phone]                                            [phone] Phone:  [phone]                                                   Phone:  [phone]  If unable to pay or uninsured, contact:  [facility] or [facility]. to become qualified for the adult dental clinic.  Chronic  Pain Problems Contact [facility]  [phone] Patients need to be referred by their primary care doctor.  Insufficient Money for Medicine Contact [employer]:  call "211" or [facility] [phone].  Psychological Services [facility]  [phone] [facility]  [phone] [facility]   [phone] (emergency services [phone])  Substance Abuse Resources [facility]  [phone] [facility] [phone] [facility] [phone] Floydene Flock [phone] Residential & Outpatient Substance Abuse Program  [phone]  Abuse/Neglect [facility] Child Abuse Hotline [phone] [facility] Child Abuse Hotline 973-318-2609 (After Hours)  Emergency Shelter [facility] [phone]  Maternity Homes [facility] [place] [facility] [phone] [facility] [phone]  [facility]  [facility]     [employer]                           [facility] Dept. [address] [place]                       [address]      [address]  Blondell Reveal Phone:  [phone]                                   Phone:  [phone]                 Phone:  [phone]  [facility] Phone:  [phone]  [facility] Child Abuse Hotline [phone] (After Hours)

## 2011-11-27 NOTE — ED Provider Notes (Signed)
Connie Fitzpatrick is a 78 y.o. female  with a mechanical fall from a wheelchair. Isolated injury to the face, and forwarded. No loss of consciousness. She has reached her baseline. She is complaints. On exam, she is alert, calm, cooperative. Vital signs are normal. Patient stable for discharge.  Flint Melter, MD 12/01/11 1038

## 2011-11-27 NOTE — ED Notes (Signed)
Pt. Also reports bloody nose. There is ecchymosis on the bridge of nose. Pt. Has old wound on right knee that may need attention as well.

## 2011-11-27 NOTE — ED Provider Notes (Signed)
History     CSN: [id number]  Arrival date & time 11/27/11  1223   First MD Initiated Contact with Patient 11/27/11 1257      Chief Complaint  Patient presents with  . Fall    (Consider location/radiation/quality/duration/timing/severity/associated sxs/prior treatment) HPI Comments: Patient presents from rehabilitation facility after a mechanical fall. Patient was sitting in a wheelchair and leaned forward causing her to fall and struck her left for head on a hard tile floor. Patient denies losing consciousness. Patient denies neck pain. She denies change in vision, nausea or vomiting, dizziness. She has a mild headache. Patient also sustained a skin tear to her right elbow. No treatments prior to arrival. Patient denies being on any blood thinning medications. Patient denies neck pain. Onset is acute. Course is constant. Nothing makes symptoms better or worse. Headache pain does not radiate.  Patient is a 78 y.o. female presenting with fall. The history is provided by the patient, the spouse and a relative.  Fall The accident occurred 1 to 2 hours ago. The fall occurred while standing. Distance fallen: standing. She landed on a hard floor. The volume of blood lost was minimal. The point of impact was the head and right elbow. The pain is mild. Associated symptoms include headaches. Pertinent negatives include no visual change, no fever, no numbness, no abdominal pain, no nausea, no vomiting and no loss of consciousness. She has tried nothing for the symptoms.    Past Medical History  Diagnosis Date  . Gout   . INSOMNIA   . ALLERGIC RHINITIS   . ASTHMA   . OSTEOARTHRITIS   . Rheumatoid arthritis   . HYPERTENSION   . HYPERLIPIDEMIA   . GERD (gastroesophageal reflux disease)   . Depression   . ALCOHOL ABUSE     hx detox, dry since 2010  . HYPOTHYROIDISM   . URINARY INCONTINENCE     Past Surgical History  Procedure Date  . Abdominal hysterectomy   . Breast surgery   . Total  knee arthroplasty   . Vaginal prolapse repair   . Total knee arthroplasty 07/2002    right  . Total knee arthroplasty 09/1998    Left  . Cataract extraction 2001  . L-spine 1998  . Colpocleisis vaginal [place] 06/2010  . Laparotomy 10/26/2011    Procedure: EXPLORATORY LAPAROTOMY;  Surgeon: Wilmon Arms. Corliss Skains, MD;  Location: [facility] OR;  Service: General;  Laterality: N/A;  . Bowel resection 10/26/2011    Procedure: SMALL BOWEL RESECTION;  Surgeon: Wilmon Arms. Corliss Skains, MD;  Location: [facility] OR;  Service: General;  Laterality: N/A;    Family History  Problem Relation Age of Onset  . Arthritis Mother   . Diabetes Mother   . Hypertension Mother   . Lung cancer Father   . Stroke Father     History  Substance Use Topics  . Smoking status: Never Smoker   . 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 Alcohol Use: No     HX alcohol abuse    OB History    Grav Para Term Preterm Abortions TAB SAB Ect Mult Living                  Review of Systems  Constitutional: Negative for fever and fatigue.  HENT: Negative for neck pain and tinnitus.   Eyes: Negative for photophobia, pain and visual disturbance.  Respiratory: Negative for shortness of breath.   Cardiovascular: Negative  for chest pain.  Gastrointestinal: Negative for nausea, vomiting and abdominal pain.  Musculoskeletal: Negative for back pain and gait problem.  Skin: Negative for wound.  Neurological: Positive for headaches. Negative for dizziness, loss of consciousness, weakness, light-headedness and numbness.  Psychiatric/Behavioral: Negative for confusion and decreased concentration.    Allergies  Allopurinol; Azithromycin; Enalapril maleate; Morphine and related; Nsaids; and Valacyclovir hcl  Home Medications   Current Outpatient Rx  Name Route Sig Dispense Refill  . ACETAMINOPHEN 500 MG PO TABS Oral Take 500 mg by mouth every 6 (six) hours as needed. For pain/fever    . ALBUTEROL  SULFATE HFA 108 (90 BASE) MCG/ACT IN AERS Inhalation Inhale 2 puffs into the lungs every 4 (four) hours as needed. For shortness of breath    . ALPRAZOLAM 0.5 MG PO TABS Oral Take 0.5-1 mg by mouth 2 (two) times daily. Take 1 in the morning and 2 in the evening    . AZELASTINE HCL 137 MCG/SPRAY NA SOLN Nasal Place 2 sprays into the nose 2 (two) times daily. Use in each nostril as directed 30 mL 2  . BIOTIN 300 MCG PO TABS Oral Take 300 mcg by mouth at bedtime.     Marland Kitchen BISACODYL 5 MG PO TBEC Oral Take 5 mg by mouth at bedtime.    Marland Kitchen CALTRATE 600 PLUS-VIT D PO Oral Take 1 tablet by mouth every evening.    . COQ10 100 MG PO CAPS Oral Take 100 mg by mouth every evening.    Marland Kitchen COLCHICINE 0.6 MG PO TABS Oral Take 0.6 mg by mouth daily with breakfast.    . CYANOCOBALAMIN 100 MCG PO TABS Oral Take 100 mcg by mouth daily.    Marland Kitchen DILTIAZEM HCL ER COATED BEADS 180 MG PO CP24 Oral Take 180 mg by mouth daily with breakfast.    . DOCUSATE SODIUM 100 MG PO CAPS Oral Take 200 mg by mouth at bedtime as needed. For constipation    . DONEPEZIL HCL 10 MG PO TABS Oral Take 10 mg by mouth daily with breakfast.    . DULOXETINE HCL 60 MG PO CPEP Oral Take 60 mg by mouth at bedtime.    . FEBUXOSTAT 40 MG PO TABS Oral Take 40 mg by mouth daily with breakfast.    . FOLIC ACID 1 MG PO TABS Oral Take 1 mg by mouth daily with breakfast.    . FUROSEMIDE 40 MG PO TABS Oral Take 20 mg by mouth daily.    Marland Kitchen GABAPENTIN 300 MG PO CAPS Oral Take 300 mg by mouth 2 (two) times daily.    Marland Kitchen JOINT SUPPORT PO Oral Take 2 tablets by mouth every evening.    Marland Kitchen HORSE CHESTNUT ER 300 MG PO CPCR Oral Take 300 mg by mouth every evening.    Marland Kitchen HYDROCODONE-ACETAMINOPHEN 10-325 MG PO TABS Oral Take 2 tablets by mouth 2 (two) times daily as needed. For pain 120 tablet 3  . IPRATROPIUM BROMIDE 0.06 % NA SOLN Nasal Place 2 sprays into the nose as needed. For rhinorrhea    . LEVOTHYROXINE SODIUM 100 MCG PO TABS Oral Take 100 mcg by mouth daily with breakfast.     . LORATADINE-PSEUDOEPHEDRINE ER 5-120 MG PO TB12 Oral Take 1 tablet by mouth 2 (two) times daily.    Marland Kitchen MELATONIN 5 MG PO CAPS Oral Take 5 mg by mouth at bedtime.     . METHOTREXATE 2.5 MG PO TABS Oral Take 10 mg by mouth once a week. Caution:Chemotherapy.  Protect from light.    Marland Kitchen METOPROLOL SUCCINATE ER 100 MG PO TB24 Oral Take 100 mg by mouth at bedtime. Take with or immediately following a meal.    . ADULT MULTIVITAMIN W/MINERALS CH Oral Take 1 tablet by mouth at bedtime.    . OCUVITE-LUTEIN PO CAPS Oral Take 1 capsule by mouth daily with breakfast.    . OMEGA-3 FATTY ACIDS 300 MG PO CAPS Oral Take 2 capsules by mouth every evening.    Marland Kitchen POTASSIUM CHLORIDE ER 10 MEQ PO TBCR Oral Take 10 mEq by mouth daily with breakfast.     . PREDNISOLONE ACETATE 1 % OP SUSP Both Eyes Place 1 drop into both eyes 2 (two) times daily as needed. For eye allergies    . PREDNISONE 5 MG PO TABS Oral Take 5 mg by mouth daily.    Marland Kitchen RANITIDINE HCL 150 MG PO TABS Oral Take 150 mg by mouth every morning.    . SELENIUM 100 MCG PO CAPS Oral Take 100 mcg by mouth at bedtime.     . THEOPHYLLINE ER 100 MG PO TB12 Oral Take 100 mg by mouth every morning.    . THEOPHYLLINE 200 MG PO TB12 Oral Take 200 mg by mouth at bedtime.    Marland Kitchen VITAMIN B-1 100 MG PO TABS Oral Take 100 mg by mouth every evening.    Marland Kitchen VITAMIN D (ERGOCALCIFEROL) 50000 UNITS PO CAPS Oral Take 50,000 Units by mouth every 30 (thirty) days.    Marland Kitchen VITAMIN E 400 UNITS PO CAPS Oral Take 400 Units by mouth every evening.       BP 128/66  Pulse 72  Temp 97.6 F (36.4 C) (Oral)  Resp 16  SpO2 98%  Physical Exam  Nursing note and vitals reviewed. Constitutional: She is oriented to person, place, and time. She appears well-developed and well-nourished.  HENT:  Head: Normocephalic. Head is without raccoon's eyes and without Battle's sign.    Right Ear: Tympanic membrane, external ear and ear canal normal. No hemotympanum.  Left Ear: Tympanic membrane,  external ear and ear canal normal. No hemotympanum.  Nose: Nose normal. No nasal septal hematoma.  Mouth/Throat: Uvula is midline, oropharynx is clear and moist and mucous membranes are normal.  Eyes: Conjunctivae, EOM and lids are normal. Pupils are equal, round, and reactive to light. Right eye exhibits no nystagmus. Left eye exhibits no nystagmus.       No visible hyphema noted  Neck: Normal range of motion. Neck supple.  Cardiovascular: Normal rate and regular rhythm.   Pulmonary/Chest: Effort normal and breath sounds normal.  Abdominal: Soft. There is no tenderness.  Musculoskeletal:       Right elbow: She exhibits laceration. She exhibits normal range of motion, no swelling and no effusion. no tenderness found.       Cervical back: She exhibits normal range of motion, no tenderness and no bony tenderness.       Thoracic back: She exhibits no tenderness and no bony tenderness.       Lumbar back: She exhibits no tenderness and no bony tenderness.       Arms: Neurological: She is alert and oriented to person, place, and time. She has normal strength and normal reflexes. No cranial nerve deficit or sensory deficit. Coordination normal. GCS eye subscore is 4. GCS verbal subscore is 5. GCS motor subscore is 6.  Skin: Skin is warm and dry.  Psychiatric: She has a normal mood and affect.    ED  Course  Procedures (including critical care time)  Labs Reviewed - No data to display No results found.   1. Laceration   2. Head injury     1:50 PM Patient seen and examined. Work-up initiated. Will CT, clean and repair wounds.   Vital signs reviewed and are as follows: Filed Vitals:   11/27/11 1225  BP: 128/66  Pulse: 72  Temp: 97.6 F (36.4 C)  Resp: 16   CT negative. Patient informed. Patient seen by and discussed with Dr. Effie Shy.   LACERATION REPAIR Performed by: Carolee Rota Authorized by: Carolee Rota Consent: Verbal consent obtained. Risks and benefits: risks, benefits  and alternatives were discussed Consent given by: patient Patient identity confirmed: provided demographic data Prepped and Draped in normal sterile fashion Wound explored  Laceration Location: superior to L eyebrow  Laceration Length: 3cm  No Foreign Bodies seen or palpated  Anesthesia: local infiltration  Local anesthetic: lidocaine 2% with epinephrine  Anesthetic total: 3 ml  Irrigation method: dermal cleanser with skin scrub  Amount of cleaning: standard  Skin closure: Ethilon 5-0  Number of sutures: 6  Technique: simple interrupted  Patient tolerance: Patient tolerated the procedure well with no immediate complications.  Patient and husband counseled on wound care, need to have sutures removed in 5-7 days.   The patient was urged to return to the Emergency Department urgently with worsening pain, swelling, expanding erythema especially if it streaks away from the affected area, fever, or if they have any other concerns. Patient verbalized understanding.   Counseled on head injury precautions and s/s to return. Patient verbalizes understanding and agrees with plan.    MDM  Head injury - neg CT, neurologically intact  Laceration - repaired, full facial movement, do not suspect muscle/nerve involvement, do not suspect FB  Abrasions and ecchymoses - conservative treatment and wound care         Renne Crigler, [place] 11/27/11 1652

## 2011-11-27 NOTE — ED Notes (Signed)
Drs. Isidore Moos called, pt. Has not had tetanus shot in past 5 yrs

## 2011-11-27 NOTE — ED Notes (Signed)
Called [facility] for transport for transportation back to [place]

## 2011-11-27 NOTE — ED Notes (Signed)
Pt. To CT

## 2011-11-27 NOTE — ED Notes (Signed)
Pt returned from CT °

## 2012-01-15 ENCOUNTER — Other Ambulatory Visit: Payer: Self-pay | Admitting: Internal Medicine

## 2012-02-25 ENCOUNTER — Non-Acute Institutional Stay: Payer: Self-pay | Admitting: Family Medicine

## 2012-02-25 NOTE — Progress Notes (Unsigned)
Patient ID: Connie Fitzpatrick, female   DOB: [birth year], 78 y.o.   MRN: [id number] Family Medicine Teaching Service [facility] History and Physical  Patient name: Connie Fitzpatrick Medical record number: [id number] Date of birth: [birth year] Age: 78 y.o. Gender: female  Primary Care Provider: Rene Paci, MD  History of Present Illness: Connie Fitzpatrick is a 78 y.o. year old female with history of gout, RA, hypothyroidism who has been a patient at [facility] since hospital discharge in June 2013 for perforated small bowel and resection. Her course has been complicated by falls and most recently a nonproductive cough, rhinorrhea and nasal congestion that started about 7 days ago. She currently feels mild improvement, requests more cough syrup at night. Denies dyspnea, wheezing, chest pain, nausea, fever, chills.   Patient Active Problem List  Diagnosis  . ALCOHOL ABUSE  . DEPRESSION  . OSTEOARTHRITIS  . INSOMNIA  . KNEE REPLACEMENT, BILATERAL, HX OF  . HYPOTHYROIDISM  . HYPERLIPIDEMIA  . HYPERTENSION  . ALLERGIC RHINITIS  . ASTHMA  . GERD  . RHEUMATOID ARTHRITIS  . ARTHRITIS  . URINARY INCONTINENCE  . Gout synovitis  . Acute respiratory failure  . Acute encephalopathy  . Perforated viscus  . Peritonitis (acute) generalized  . Adrenal insufficiency, primary, iatrogenic  . Acute renal failure   Past Medical History: Past Medical History  Diagnosis Date  . Gout   . INSOMNIA   . ALLERGIC RHINITIS   . ASTHMA   . OSTEOARTHRITIS   . Rheumatoid arthritis   . HYPERTENSION   . HYPERLIPIDEMIA   . GERD (gastroesophageal reflux disease)   . Depression   . ALCOHOL ABUSE     hx detox, dry since 2010  . HYPOTHYROIDISM   . URINARY INCONTINENCE     Past Surgical History: Past Surgical History  Procedure Date  . Abdominal hysterectomy   . Breast surgery   . Total knee arthroplasty   . Vaginal prolapse repair   . Total knee arthroplasty 07/2002    right  . Total knee arthroplasty  09/1998    Left  . Cataract extraction 2001  . L-spine 1998  . Colpocleisis vaginal [place] 06/2010  . Laparotomy 10/26/2011    Procedure: EXPLORATORY LAPAROTOMY;  Surgeon: Wilmon Arms. Corliss Skains, MD;  Location: [facility] OR;  Service: General;  Laterality: N/A;  . Bowel resection 10/26/2011    Procedure: SMALL BOWEL RESECTION;  Surgeon: Wilmon Arms. Corliss Skains, MD;  Location: [facility] OR;  Service: General;  Laterality: N/A;    Social History: History   Social History  . Marital Status: Married    Spouse Name: N/A    Number of Children: N/A  . Years of Education: N/A   Social History Main Topics  . Smoking status: Never Smoker   . 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 Alcohol Use: No     HX alcohol abuse  . Drug Use: No  . Sexually Active: Not Currently   Other Topics Concern  . Not on file   Social History Narrative  . No narrative on file    Family History: Family History  Problem Relation Age of Onset  . Arthritis Mother   . Diabetes Mother   . Hypertension Mother   . Lung cancer Father   . Stroke Father     Allergies: Allergies  Allergen Reactions  . Allopurinol     REACTION: Skin rash  . Azithromycin     Unknown  .  Enalapril Maleate     REACTION: swelling of mouth \\T \ throat  . Morphine And Related Other (See Comments)    Possible hallucinations ( 07/12)  . Nsaids     Ulcer on EGD 01/01/10 - stopped celebrex  . Valacyclovir Hcl     Unknown    Current Outpatient Prescriptions  Medication Sig Dispense Refill  . acetaminophen (TYLENOL) 500 MG tablet Take 500 mg by mouth every 6 (six) hours as needed. For pain/fever      . ALPRAZolam (XANAX) 0.5 MG tablet Take 0.5-1 mg by mouth 2 (two) times daily. Take 1 in the morning and 2 in the evening      . azelastine (ASTELIN) 137 MCG/SPRAY nasal spray Place 2 sprays into the nose 2 (two) times daily. Use in each nostril as directed  30 mL  2  . Biotin 300 MCG TABS Take 300 mcg by  mouth at bedtime.       . bisacodyl (DULCOLAX) 5 MG EC tablet Take 5 mg by mouth at bedtime.      . Calcium-Vitamin D (CALTRATE 600 PLUS-VIT D PO) Take 1 tablet by mouth every evening.      . Coenzyme Q10 (COQ10) 100 MG CAPS Take 100 mg by mouth every evening.      . colchicine 0.6 MG tablet Take 0.6 mg by mouth daily with breakfast.      . cyanocobalamin 100 MCG tablet Take 100 mcg by mouth daily.      Marland Kitchen diltiazem (CARDIZEM CD) 180 MG 24 hr capsule Take 180 mg by mouth daily with breakfast.      . docusate sodium (COLACE) 100 MG capsule Take 200 mg by mouth at bedtime as needed. For constipation      . donepezil (ARICEPT) 10 MG tablet Take 10 mg by mouth daily with breakfast.      . DULoxetine (CYMBALTA) 60 MG capsule Take 60 mg by mouth at bedtime.      . febuxostat (ULORIC) 40 MG tablet Take 40 mg by mouth daily with breakfast.      . folic acid (FOLVITE) 1 MG tablet Take 1 mg by mouth daily with breakfast.      . furosemide (LASIX) 40 MG tablet Take 20 mg by mouth daily.      Marland Kitchen gabapentin (NEURONTIN) 300 MG capsule Take 300 mg by mouth 2 (two) times daily.      . Glucosamine-Chondroitin (JOINT SUPPORT PO) Take 2 tablets by mouth every evening.      Marland Kitchen guaifenesin (ROBITUSSIN) 100 MG/5ML syrup Take 200 mg by mouth 3 (three) times daily as needed.      . Horse Chestnut (VENASTAT) 300 MG CPCR Take 300 mg by mouth every evening.      Marland Kitchen HYDROcodone-acetaminophen (NORCO) 10-325 MG per tablet Take 2 tablets by mouth 2 (two) times daily as needed. For pain  120 tablet  3  . ipratropium (ATROVENT) 0.06 % nasal spray Place 2 sprays into the nose as needed. For rhinorrhea      . levothyroxine (SYNTHROID, LEVOTHROID) 100 MCG tablet Take 100 mcg by mouth daily with breakfast.      . loratadine (CLARITIN) 10 MG tablet Take 10 mg by mouth daily.      Marland Kitchen loratadine-pseudoephedrine (CLARITIN-D 12-HOUR) 5-120 MG per tablet Take 1 tablet by mouth 2 (two) times daily.      . Melatonin 5 MG CAPS Take 5 mg by  mouth at bedtime.       . methotrexate (  RHEUMATREX) 2.5 MG tablet Take 10 mg by mouth once a week. Caution:Chemotherapy. Protect from light.      . metoprolol succinate (TOPROL-XL) 100 MG 24 hr tablet Take 100 mg by mouth at bedtime. Take with or immediately following a meal.      . Multiple Vitamin (MULITIVITAMIN WITH MINERALS) TABS Take 1 tablet by mouth at bedtime.      . multivitamin-lutein (OCUVITE-LUTEIN) CAPS Take 1 capsule by mouth daily with breakfast.      . Omega-3 Fatty Acids 300 MG CAPS Take 2 capsules by mouth every evening.      Marland Kitchen omeprazole (PRILOSEC) 20 MG capsule Take 20 mg by mouth daily.      . potassium chloride (K-DUR) 10 MEQ tablet Take 10 mEq by mouth daily with breakfast.       . prednisoLONE acetate (PRED FORTE) 1 % ophthalmic suspension Place 1 drop into both eyes 2 (two) times daily as needed. For eye allergies      . predniSONE (DELTASONE) 5 MG tablet Take 5 mg by mouth daily.      Marland Kitchen PROVENTIL HFA 108 (90 BASE) MCG/ACT inhaler INHALE 2 PUFFS USING INHALER THREE TIMES A DAY AS NEEDED  18 g  1  . Selenium 100 MCG CAPS Take 100 mcg by mouth at bedtime.       . theophylline (THEODUR) 100 MG 12 hr tablet Take 100 mg by mouth every morning.      . theophylline (THEODUR) 200 MG 12 hr tablet Take 200 mg by mouth at bedtime.      . thiamine (VITAMIN B-1) 100 MG tablet Take 100 mg by mouth every evening.      . Vitamin D, Ergocalciferol, (DRISDOL) 50000 UNITS CAPS Take 50,000 Units by mouth every 30 (thirty) days.      . vitamin E 400 UNIT capsule Take 400 Units by mouth every evening.        Review Of Systems: Per HPI with the following additions: none Otherwise 12 point review of systems was performed and was unremarkable.  Physical Exam: Pulse: 77   RR: 16   O2: 96% on RA Temp: 98.8  General: alert, cooperative, appears stated age and no distress HEENT: PERRLA and extra ocular movement intact Heart: S1, S2 normal, no murmur, rub or gallop, regular rate and  rhythm Lungs: unlabored breathing and rare expiratory wheeze, basilar rales clear with expectoration. Abdomen: abdomen is soft without significant tenderness, masses, organomegaly or guarding. Midline scar well healed. Extremities: extremities normal, atraumatic, no cyanosis or edema Skin:no rashes Neurology: normal without focal findings, mental status, speech normal, alert and oriented x3 and PERLA  Labs and Imaging: Lab Results  Component Value Date/Time   NA 141 10/28/2011  3:37 AM   K 4.2 10/28/2011  3:37 AM   CL 109 10/28/2011  3:37 AM   CO2 23 10/28/2011  3:37 AM   BUN 16 10/28/2011  3:37 AM   CREATININE 1.08 11/02/2011  6:26 AM   GLUCOSE 155* 10/28/2011  3:37 AM   Lab Results  Component Value Date   WBC 20.7* 10/28/2011   HGB 10.6* 10/28/2011   HCT 32.7* 10/28/2011   MCV 97.3 10/28/2011   PLT 365 10/28/2011   CXR 02/24/12: read as possible left lower lobe infiltrate  Assessment and Plan: Connie Fitzpatrick is a 78 y.o. year old female with history of severe RA, gout, HTN who is under skilled nursing care since June 2013 after a bowel resection. Current issues: URI/cough.  1. URI/cough. Symptoms improving currently. No fever or systemic signs of bacterial infection. ? Of infiltate on plain film. Will monitor clinically and start empiric antibiotics if not improving tomorrow (azithromycin allergy noted).   2. Asthma. Albuterol prn. Stable on theophylline (last level 14). No current sign of exacerbation. Add spacer to use with albuterol/ipratroprium inh.  3. HTN. Stable on meds metoprolol, cardizem.  4. Gout. On colcichine and uloric.  5. RA, severe. Continue prednisone 5mg . Appears to be off methotrexate now. On neurontin, vicodin and cymbalta for chronic pain.   6. Depression. Seems stable with cymbalta.  7. Hypothyroidism. Continue thyroxine and check TSH next lab draw. Lab Results  Component Value Date   TSH 0.62 11/03/2010    8. Dementia. On aricept. Plan to check MMSE.   9.  Falls. Continue PT. F/u Vit D level next lab draw.   Lloyd Huger, MD [facility] Resident - PGY-3 02/25/2012 5:13 PM

## 2012-02-25 NOTE — Progress Notes (Unsigned)
  Subjective:    Patient ID: Connie Fitzpatrick, female    DOB: December 22, 1927, 78 y.o.   MRN: [id number]  HPI Connie Fitzpatrick had requested transfer to our service yesterday. I visited her and also discussed the situation with her husband.  Assessed I can determine there were some communication issues with her current nursing home care team  Past 3 days she has had upper airway particularly nasal congestion and cough. No fever or shortness of breath. She says the cough medicine is helping her  Review of Systems     Objective:   Physical Exam She's lying in bed and not dyspneic chest a few scattered rhonchi no crackles Heart regular rhythm without murmur Abdomen soft nontender Extremities marked enlargement deformity the wrist less so the hands suggestive her rheumatoid disease Neurologic she registers 3 recalls one but is oriented to date and city She meandering historian with some tangential thoughts She was pleasant and cooperative with good attention Chest x-ray report from yesterday show possible left lower lobe infiltrate         Assessment & Plan:

## 2012-02-26 ENCOUNTER — Non-Acute Institutional Stay: Payer: Self-pay | Admitting: Family Medicine

## 2012-02-26 MED ORDER — LEVOFLOXACIN 500 MG PO TABS: 500.0000 mg | ORAL_TABLET | Freq: Every day | ORAL | Status: AC

## 2012-02-26 NOTE — Progress Notes (Signed)
  Subjective:    Patient ID: Connie Fitzpatrick, female    DOB: 12/06/1927, 78 y.o.   MRN: [id number]  HPI Says she doesn't feel well. Hurts all over, but not particularly in her chest. Did take the Guaifenesin this morning the the thinks helps.   WBC was 20K Review of Systems     Objective:   Physical Exam  Constitutional:       Alert, sitting in wheelchair.   Cardiovascular: Normal rate and regular rhythm.   Pulmonary/Chest: Effort normal. She has no wheezes. She has no rales.       Rales and rhonchi LLL   Neurological: She is alert.  Psychiatric: She has a normal mood and affect. Her behavior is normal.       Seems mildly confused          Assessment & Plan:

## 2012-02-26 NOTE — Patient Instructions (Signed)
I left a message on the husband's machine that we had started treatment.

## 2012-03-04 ENCOUNTER — Non-Acute Institutional Stay: Payer: Self-pay | Admitting: Emergency Medicine

## 2012-03-04 ENCOUNTER — Encounter: Payer: Self-pay | Admitting: Emergency Medicine

## 2012-03-04 DIAGNOSIS — F329 Major depressive disorder, single episode, unspecified: Secondary | ICD-10-CM

## 2012-03-04 DIAGNOSIS — I10 Essential (primary) hypertension: Secondary | ICD-10-CM

## 2012-03-04 NOTE — Progress Notes (Signed)
  Subjective:    Patient ID: Connie Fitzpatrick, female    DOB: Dec 25, 1927, 78 y.o.   MRN: [id number]  HPI Patient seen in the nursing home.  Having a bad day today.  Continues to cough and she has not received any cough medicine despite asking for it.  Also states arthritis pain is a little worse today.  Thinks she will probably need a longer course of antibiotics for her pneumonia.  Completed 7 days of Levaquin yesterday.  No fevers or chills.    Review of Systems     Objective:   Physical Exam Gen: alert, cooperative, NAD, talkative HEENT: AT/[place], sclera white, MMM, no nasal discharge CV: RRR, no murmurs Pulm: good air movement, scattered rhonchi that cleared with cough Ext: 1+ pitting edema bilaterally to knees      Assessment & Plan:

## 2012-03-04 NOTE — Assessment & Plan Note (Signed)
Well-controlled.  Continue metoprolol and diltiazem. 

## 2012-03-04 NOTE — Assessment & Plan Note (Addendum)
Reports overall mood as happy.  MMSE 23/28 (lost 2 for day and date, 3 for world), did not test sentence and pentagons as has difficulty with writing due to RA.

## 2012-03-04 NOTE — Assessment & Plan Note (Signed)
Completed course of Levaquin yesterday.  Continues to have cough and congestion in her chest.  Will continue mucinex over the weekend and see how she is doing on Monday.

## 2012-03-08 LAB — BASIC METABOLIC PANEL
BUN: 32 mg/dL — AB (ref 4–21)
Creatinine: 1 mg/dL (ref 0.5–1.1)
Glucose: 125 mg/dL
Sodium: 142 mmol/L (ref 137–147)

## 2012-03-29 ENCOUNTER — Encounter: Payer: Self-pay | Admitting: Emergency Medicine

## 2012-04-10 ENCOUNTER — Telehealth: Payer: Self-pay | Admitting: Family Medicine

## 2012-04-10 NOTE — Telephone Encounter (Signed)
Patient fell and hit her forehead on the sink.  She has a small, superficial laceration over RT eyebrow.  RN applied steri-strip and bleeding resolved.  Patient is asymptomatic - denies any headache or dizziness.  Neuro checks have been normal.  Advised RN to continue neuro checks and to call us back if she develops any symptoms.  RN understood.

## 2012-04-13 ENCOUNTER — Non-Acute Institutional Stay: Payer: Self-pay | Admitting: Family Medicine

## 2012-05-02 ENCOUNTER — Encounter: Payer: Self-pay | Admitting: Family Medicine

## 2012-05-02 DIAGNOSIS — F0150 Vascular dementia without behavioral disturbance: Secondary | ICD-10-CM | POA: Insufficient documentation

## 2012-05-06 ENCOUNTER — Non-Acute Institutional Stay: Payer: Self-pay | Admitting: Family Medicine

## 2012-05-06 ENCOUNTER — Encounter: Payer: Self-pay | Admitting: Pharmacist

## 2012-05-20 ENCOUNTER — Encounter: Payer: Self-pay | Admitting: Emergency Medicine

## 2012-05-20 ENCOUNTER — Non-Acute Institutional Stay: Payer: Self-pay | Admitting: Family Medicine

## 2012-05-20 ENCOUNTER — Non-Acute Institutional Stay: Payer: Self-pay | Admitting: Emergency Medicine

## 2012-05-20 DIAGNOSIS — E039 Hypothyroidism, unspecified: Secondary | ICD-10-CM

## 2012-05-20 DIAGNOSIS — I10 Essential (primary) hypertension: Secondary | ICD-10-CM

## 2012-05-20 DIAGNOSIS — F0390 Unspecified dementia without behavioral disturbance: Secondary | ICD-10-CM

## 2012-05-20 DIAGNOSIS — F329 Major depressive disorder, single episode, unspecified: Secondary | ICD-10-CM

## 2012-05-20 NOTE — Assessment & Plan Note (Signed)
Doing well with Aricept.  MMSE stable from previous.

## 2012-05-20 NOTE — Assessment & Plan Note (Signed)
Much improved per patient report and by clinical assessment.

## 2012-05-20 NOTE — Progress Notes (Signed)
  Subjective:    Patient ID: Connie Fitzpatrick, female    DOB: 16-Apr-1928, 78 y.o.   MRN: [id number]  HPI Connie Fitzpatrick was seen in her room at [facility].  She was sitting comfortably in her wheelchair.  She states that she is doing much better than when I last saw her, both physically and mentally.  States she is walking with a walker in physical therapy and went 145ft the other day.  Her goal is to build up her strength so she can go home.  Eating well.  Denies any n/v/d, chest pain, SOB, urinary difficulties.   Review of Systems See HPI    Objective:   Physical Exam BP 126/63  Pulse 74  Resp 18  Wt 114 lb 12.8 oz (52.073 kg) Gen: alert, cooperative, talkative, NAD HEENT: AT/[place], sclera white, MMM Neck: supple, thyroid normal CV: RRR, no murmurs Pulm: CTAB, no wheezes or rales Abd: +BS, soft, NTND Ext: 2+ pitting edema to mid-shin bilaterally, palpable DP pulses; hand deformities consistent with RA Neuro: no focal deficits Skin: no rashes or lesions Psych: MMSE 24/30, euthymic mood and affect     Assessment & Plan:  She would like to go home in the next few months.  This depends more on her ability to get her strength back and her husband's comfort level.  Would not recommend discharge at this time given her limited mobility and mild dementia.

## 2012-05-20 NOTE — Assessment & Plan Note (Signed)
Well controlled. Continue current medications  

## 2012-05-20 NOTE — Progress Notes (Signed)
Med simplification (omega 3 or questionable benefit in patient) and change to tramadol as patient reports she will not take norco.

## 2012-05-20 NOTE — Assessment & Plan Note (Signed)
Stable.  TSH wnl 11/2011.  Will recheck on a yearly basis.

## 2012-05-25 ENCOUNTER — Emergency Department (HOSPITAL_COMMUNITY)
Admission: EM | Admit: 2012-05-25 | Discharge: 2012-05-26 | Disposition: A | Discharge status: Skilled Nursing Facility | Payer: Medicare Other | Attending: Emergency Medicine | Admitting: Emergency Medicine

## 2012-05-25 ENCOUNTER — Emergency Department (HOSPITAL_COMMUNITY): Payer: Medicare Other

## 2012-05-25 ENCOUNTER — Encounter (HOSPITAL_COMMUNITY): Payer: Self-pay | Admitting: Unknown Physician Specialty

## 2012-05-25 DIAGNOSIS — Y9229 Other specified public building as the place of occurrence of the external cause: Secondary | ICD-10-CM | POA: Insufficient documentation

## 2012-05-25 DIAGNOSIS — M109 Gout, unspecified: Secondary | ICD-10-CM | POA: Insufficient documentation

## 2012-05-25 DIAGNOSIS — IMO0002 Reserved for concepts with insufficient information to code with codable children: Secondary | ICD-10-CM

## 2012-05-25 DIAGNOSIS — E039 Hypothyroidism, unspecified: Secondary | ICD-10-CM | POA: Insufficient documentation

## 2012-05-25 DIAGNOSIS — W050XXA Fall from non-moving wheelchair, initial encounter: Secondary | ICD-10-CM | POA: Insufficient documentation

## 2012-05-25 DIAGNOSIS — Z79899 Other long term (current) drug therapy: Secondary | ICD-10-CM | POA: Insufficient documentation

## 2012-05-25 DIAGNOSIS — S0100XA Unspecified open wound of scalp, initial encounter: Secondary | ICD-10-CM | POA: Insufficient documentation

## 2012-05-25 DIAGNOSIS — E785 Hyperlipidemia, unspecified: Secondary | ICD-10-CM | POA: Insufficient documentation

## 2012-05-25 DIAGNOSIS — Y939 Activity, unspecified: Secondary | ICD-10-CM | POA: Insufficient documentation

## 2012-05-25 DIAGNOSIS — J45909 Unspecified asthma, uncomplicated: Secondary | ICD-10-CM | POA: Insufficient documentation

## 2012-05-25 DIAGNOSIS — M1990 Unspecified osteoarthritis, unspecified site: Secondary | ICD-10-CM | POA: Insufficient documentation

## 2012-05-25 DIAGNOSIS — I10 Essential (primary) hypertension: Secondary | ICD-10-CM | POA: Insufficient documentation

## 2012-05-25 DIAGNOSIS — G4700 Insomnia, unspecified: Secondary | ICD-10-CM | POA: Insufficient documentation

## 2012-05-25 NOTE — ED Notes (Signed)
Returned from Radiology.  Alert,oriented times 4.  Denies pain at this time except upon palpation of posterior head.  C-collar in place. MAE.  Family at bedside.  Pt denies LOC.

## 2012-05-25 NOTE — ED Notes (Signed)
Patient arrived via [facility] post fall at [facility]. Patient was standing up from her wheelchair to remove her coat when she lost her balance and fell hitting her right side of her head on the floor. Patient states she never lost consciousness. She has a laceration to her right occipital area. Patient has a c-collar and spine board in place. No IV access. Patient A/O x3.

## 2012-05-25 NOTE — ED Provider Notes (Signed)
History     CSN: [id number]  Arrival date & time 05/25/12  2234   First MD Initiated Contact with Patient 05/25/12 2253      Chief Complaint  Patient presents with  . Fall    (Consider location/radiation/quality/duration/timing/severity/associated sxs/prior treatment) HPI History provided by patient and family bedside. The nursing home patient to a witnessed fall tonight. Standing up from her wheelchair to remove her coat when she lost her balance backwards striking her right parietal scalp and sustaining laceration. No LOC. Bleeding controlled prior to arrival. Brought in by EMS with c-collar and spine board. Patient denies any other pain injury or trauma. Moderate severity. Minimal headache. Patient declines any pain medications at this time. Past Medical History  Diagnosis Date  . Gout   . INSOMNIA   . ALLERGIC RHINITIS   . ASTHMA   . OSTEOARTHRITIS   . Rheumatoid arthritis   . HYPERTENSION   . HYPERLIPIDEMIA   . GERD (gastroesophageal reflux disease)   . Depression   . ALCOHOL ABUSE     hx detox, dry since 2010  . HYPOTHYROIDISM   . URINARY INCONTINENCE     Past Surgical History  Procedure Date  . Abdominal hysterectomy   . Breast surgery   . Total knee arthroplasty   . Vaginal prolapse repair   . Total knee arthroplasty 07/2002    right  . Total knee arthroplasty 09/1998    Left  . Cataract extraction 2001  . L-spine 1998  . Colpocleisis vaginal [place] 06/2010  . Laparotomy 10/26/2011    Procedure: EXPLORATORY LAPAROTOMY;  Surgeon: Wilmon Arms. Corliss Skains, MD;  Location: [facility] OR;  Service: General;  Laterality: N/A;  . Bowel resection 10/26/2011    Procedure: SMALL BOWEL RESECTION;  Surgeon: Wilmon Arms. Corliss Skains, MD;  Location: [facility] OR;  Service: General;  Laterality: N/A;    Family History  Problem Relation Age of Onset  . Arthritis Mother   . Diabetes Mother   . Hypertension Mother   . Lung cancer Father   . Stroke Father     History  Substance Use Topics  .  Smoking status: Never Smoker   . 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 Alcohol Use: No     Comment: HX alcohol abuse    OB History    Grav Para Term Preterm Abortions TAB SAB Ect Mult Living                  Review of Systems  Constitutional: Negative for fever and chills.  HENT: Negative for neck pain and neck stiffness.   Eyes: Negative for visual disturbance.  Respiratory: Negative for shortness of breath.   Cardiovascular: Negative for chest pain.  Gastrointestinal: Negative for vomiting and abdominal pain.  Genitourinary: Negative for flank pain.  Musculoskeletal: Negative for back pain.  Skin: Positive for wound. Negative for rash.  Neurological: Negative for headaches.  All other systems reviewed and are negative.    Allergies  Allopurinol; Azithromycin; Enalapril maleate; Morphine and related; Nsaids; and Valacyclovir hcl  Home Medications   Current Outpatient Rx  Name  Route  Sig  Dispense  Refill  . ACETAMINOPHEN 500 MG PO TABS   Oral   Take 500 mg by mouth every 6 (six) hours as needed. For pain/fever         . ALPRAZOLAM 0.5 MG PO TABS   Oral   Take 0.5-1 mg by mouth  2 (two) times daily. Take 1 in the morning and 2 in the evening         . AZELASTINE HCL 137 MCG/SPRAY NA SOLN   Nasal   Place 2 sprays into the nose 2 (two) times daily. Use in each nostril as directed   30 mL   2   . BIOTIN 300 MCG PO TABS   Oral   Take 300 mcg by mouth at bedtime.          Marland Kitchen BISACODYL 5 MG PO TBEC   Oral   Take 5 mg by mouth at bedtime.         Marland Kitchen CALTRATE 600 PLUS-VIT D PO   Oral   Take 1 tablet by mouth every evening.         Marland Kitchen COLCHICINE 0.6 MG PO TABS   Oral   Take 0.6 mg by mouth daily with breakfast.         . CYANOCOBALAMIN 100 MCG PO TABS   Oral   Take 100 mcg by mouth daily.         Marland Kitchen DILTIAZEM HCL ER COATED BEADS 180 MG PO CP24   Oral   Take 180 mg by mouth daily with  breakfast.         . DOCUSATE SODIUM 100 MG PO CAPS   Oral   Take 200 mg by mouth at bedtime as needed. For constipation         . DONEPEZIL HCL 10 MG PO TABS   Oral   Take 10 mg by mouth daily with breakfast.         . DULOXETINE HCL 60 MG PO CPEP   Oral   Take 60 mg by mouth at bedtime.         Marland Kitchen FAMOTIDINE 20 MG PO TABS   Oral   Take 1 tablet (20 mg total) by mouth at bedtime.         . FEBUXOSTAT 40 MG PO TABS   Oral   Take 40 mg by mouth daily with breakfast.         . FOLIC ACID 1 MG PO TABS   Oral   Take 1 mg by mouth daily with breakfast.         . FUROSEMIDE 40 MG PO TABS   Oral   Take 20 mg by mouth daily.         Marland Kitchen GABAPENTIN 300 MG PO CAPS   Oral   Take 300 mg by mouth 2 (two) times daily.         Marland Kitchen HORSE CHESTNUT ER 300 MG PO CPCR   Oral   Take 300 mg by mouth every evening.         Marland Kitchen LEVOTHYROXINE SODIUM 100 MCG PO TABS   Oral   Take 100 mcg by mouth daily with breakfast.         . LORATADINE 10 MG PO TABS   Oral   Take 10 mg by mouth daily.         Marland Kitchen LORATADINE-PSEUDOEPHEDRINE ER 5-120 MG PO TB12   Oral   Take 1 tablet by mouth 2 (two) times daily.         Marland Kitchen MELATONIN 5 MG PO CAPS   Oral   Take 5 mg by mouth at bedtime.          Marland Kitchen METOPROLOL SUCCINATE ER 100 MG PO TB24   Oral   Take 100 mg by mouth  at bedtime. Take with or immediately following a meal.         . ADULT MULTIVITAMIN W/MINERALS CH   Oral   Take 1 tablet by mouth at bedtime.         . OCUVITE-LUTEIN PO CAPS   Oral   Take 1 capsule by mouth daily with breakfast.         . POTASSIUM CHLORIDE ER 10 MEQ PO TBCR   Oral   Take 10 mEq by mouth daily with breakfast.          . PREDNISOLONE ACETATE 1 % OP SUSP   Both Eyes   Place 1 drop into both eyes 2 (two) times daily as needed. For eye allergies         . PREDNISONE 5 MG PO TABS   Oral   Take 5 mg by mouth daily.         Marland Kitchen PROVENTIL HFA 108 (90 BASE) MCG/ACT IN AERS       INHALE 2 PUFFS USING INHALER THREE TIMES A DAY AS NEEDED   18 g   1   . THEOPHYLLINE ER 100 MG PO TB12   Oral   Take 100 mg by mouth every morning.         . THEOPHYLLINE 200 MG PO TB12   Oral   Take 200 mg by mouth at bedtime.         Marland Kitchen VITAMIN B-1 100 MG PO TABS   Oral   Take 100 mg by mouth every evening.         Marland Kitchen TRAMADOL HCL 50 MG PO TABS   Oral   Take 1 tablet (50 mg total) by mouth every 8 (eight) hours as needed for pain.   30 tablet   0   . VITAMIN D (ERGOCALCIFEROL) 50000 UNITS PO CAPS   Oral   Take 50,000 Units by mouth every 30 (thirty) days.         Marland Kitchen VITAMIN E 400 UNITS PO CAPS   Oral   Take 400 Units by mouth every evening.            BP 173/91  Pulse 61  Temp 97.6 F (36.4 C) (Oral)  Resp 18  SpO2 100%  Physical Exam  Constitutional: She appears well-developed and well-nourished.  HENT:  Head: Normocephalic.       2 cm curvilinear laceration to right parietal scalp is full-thickness and hemostatic. No underlying bony deformity.   Eyes: EOM are normal. Pupils are equal, round, and reactive to light.  Neck:       No midline cervical tenderness with C. collar in place.  Cardiovascular: Normal rate, regular rhythm and intact distal pulses.   Pulmonary/Chest: Effort normal and breath sounds normal. No respiratory distress. She exhibits no tenderness.  Abdominal: Soft. She exhibits no distension. There is no tenderness.  Musculoskeletal: Normal range of motion. She exhibits no edema.       Chronic changes of arthritis. Results for his x4. No bony tenderness or deformity otherwise.  Neurological: She is alert.       Awake. Oriented to self. No focal deficits.  Skin: Skin is warm and dry.    ED Course  LACERATION REPAIR Date/Time: 05/26/2012 1:06 AM Performed by: Sunnie Nielsen Authorized by: Sunnie Nielsen Consent: Verbal consent obtained. Risks and benefits: risks, benefits and alternatives were discussed Consent given by:  patient Patient understanding: patient states understanding of the procedure being performed Patient consent: the patient's  understanding of the procedure matches consent given Procedure consent: procedure consent matches procedure scheduled Required items: required blood products, implants, devices, and special equipment available Patient identity confirmed: verbally with patient Time out: Immediately prior to procedure a "time out" was called to verify the correct patient, procedure, equipment, support staff and site/side marked as required. Body area: head/neck Location details: scalp Laceration length: 2 cm Anesthesia: local infiltration Local anesthetic: lidocaine 1% without epinephrine Anesthetic total: 2 ml Preparation: Patient was prepped and draped in the usual sterile fashion. Irrigation solution: saline Irrigation method: syringe Amount of cleaning: extensive Skin closure: staples Number of sutures: 2 Technique: simple Approximation: loose Approximation difficulty: simple Dressing: antibiotic ointment Patient tolerance: Patient tolerated the procedure well with no immediate complications.   (including critical care time)  Ct Head Wo Contrast  05/25/2012  *RADIOLOGY REPORT*  Clinical Data:  Larey Seat while getting out of wheelchair, sustaining laceration to the back of the head on the right side.  CT HEAD WITHOUT CONTRAST CT CERVICAL SPINE WITHOUT CONTRAST  Technique:  Multidetector CT imaging of the head and cervical spine was performed following the standard protocol without intravenous contrast.  Multiplanar CT image reconstructions of the cervical spine were also generated.  Comparison:  CT head 11/27/2011. CT head cervical 08/21/2010 and 08/16/2010.  CT HEAD  Findings: Moderate cortical atrophy and moderate to severe deep atrophy, unchanged.  Severe changes of small vessel disease of the white matter diffusely, unchanged.  No mass lesion.  No midline shift.  No acute hemorrhage  or hematoma.  No extra-axial fluid collections.  No evidence of acute infarction.  No significant interval change.  Scalp laceration and small hematoma in the right parietal region approaching the vertex without underlying skull fracture.  Air- fluid level on the left sphenoid sinus.  Remaining visualized paranasal sinuses, bilateral mastoid air cells, and bilateral middle ear cavities well-aerated.  Bilateral carotid siphon and vertebral artery atherosclerosis.  IMPRESSION:  1.  No acute intracranial abnormality. 2.  Stable moderate to severe generalized atrophy and severe chronic microvascular ischemic changes of the white matter. 3.  Acute left sphenoid sinusitis. 4.  Right parietal scalp laceration and hematoma approaching the vertex without underlying skull fracture.  CT CERVICAL SPINE  Findings: No fractures identified involving the cervical spine. Sagittal reconstructed images demonstrate evidence Hamrick alignment with reversal of the usual lordosis.  Disc space narrowing endplate hypertrophic changes at every level from C3-4 through C6-7, worst at C4-5.  Borderline to mild spinal stenosis at C4-5 as a result.  Calcified pannus surrounding the dens, with erosive changes in the dens; the pannus causes borderline to one spinal stenosis at the C1-C2 level.  Facet joints intact with diffuse degenerative changes.  Combination of uncinate and facet hypertrophy account for multilevel bilateral foraminal stenoses.  IMPRESSION:  1.  No cervical spine fractures identified. 2.  Severe multilevel degenerative disc disease, spondylosis, and facet degenerative changes.  Borderline to mild spinal stenosis at C4-5.  Multilevel bilateral foraminal stenoses. 3.  Borderline to mild spinal stenosis at the C1-C2 level due to calcified pannus.   Original Report Authenticated By: Hulan Saas, M.D.    Ct Cervical Spine Wo Contrast  05/25/2012  *RADIOLOGY REPORT*  Clinical Data:  Larey Seat while getting out of wheelchair,  sustaining laceration to the back of the head on the right side.  CT HEAD WITHOUT CONTRAST CT CERVICAL SPINE WITHOUT CONTRAST  Technique:  Multidetector CT imaging of the head and cervical spine was performed following the standard protocol  without intravenous contrast.  Multiplanar CT image reconstructions of the cervical spine were also generated.  Comparison:  CT head 11/27/2011. CT head cervical 08/21/2010 and 08/16/2010.  CT HEAD  Findings: Moderate cortical atrophy and moderate to severe deep atrophy, unchanged.  Severe changes of small vessel disease of the white matter diffusely, unchanged.  No mass lesion.  No midline shift.  No acute hemorrhage or hematoma.  No extra-axial fluid collections.  No evidence of acute infarction.  No significant interval change.  Scalp laceration and small hematoma in the right parietal region approaching the vertex without underlying skull fracture.  Air- fluid level on the left sphenoid sinus.  Remaining visualized paranasal sinuses, bilateral mastoid air cells, and bilateral middle ear cavities well-aerated.  Bilateral carotid siphon and vertebral artery atherosclerosis.  IMPRESSION:  1.  No acute intracranial abnormality. 2.  Stable moderate to severe generalized atrophy and severe chronic microvascular ischemic changes of the white matter. 3.  Acute left sphenoid sinusitis. 4.  Right parietal scalp laceration and hematoma approaching the vertex without underlying skull fracture.  CT CERVICAL SPINE  Findings: No fractures identified involving the cervical spine. Sagittal reconstructed images demonstrate evidence Hamrick alignment with reversal of the usual lordosis.  Disc space narrowing endplate hypertrophic changes at every level from C3-4 through C6-7, worst at C4-5.  Borderline to mild spinal stenosis at C4-5 as a result.  Calcified pannus surrounding the dens, with erosive changes in the dens; the pannus causes borderline to one spinal stenosis at the C1-C2 level.   Facet joints intact with diffuse degenerative changes.  Combination of uncinate and facet hypertrophy account for multilevel bilateral foraminal stenoses.  IMPRESSION:  1.  No cervical spine fractures identified. 2.  Severe multilevel degenerative disc disease, spondylosis, and facet degenerative changes.  Borderline to mild spinal stenosis at C4-5.  Multilevel bilateral foraminal stenoses. 3.  Borderline to mild spinal stenosis at the C1-C2 level due to calcified pannus.   Original Report Authenticated By: Hulan Saas, M.D.     Pain medications declined. Baseline mentation per family bedside.  MDM   Fall with head laceration. Wound repaired as above.  Tetanus updated. CT scans obtained and reviewed as above. No cranial bleed. No cervical spine fracture.  Vital signs and nursing notes reviewed. Prescription provided. Plans staple removal 10 days. f/u primary care physician at nursing home.      Sunnie Nielsen, MD 05/26/12 [phone]

## 2012-05-26 MED ORDER — TRAMADOL HCL 50 MG PO TABS: 50.0000 mg | ORAL_TABLET | Freq: Three times a day (TID) | ORAL | Status: DC | PRN

## 2012-05-26 MED ORDER — TETANUS-DIPHTH-ACELL PERTUSSIS 5-2.5-18.5 LF-MCG/0.5 IM SUSP
0.5000 mL | Freq: Once | INTRAMUSCULAR | Status: AC
  Administered 2012-05-26: 0.5 mL via INTRAMUSCULAR
  Filled 2012-05-26 (×2): qty 0.5

## 2012-05-26 NOTE — ED Notes (Signed)
C-collar removed by Dr Dierdre Highman

## 2012-06-03 ENCOUNTER — Encounter: Payer: Self-pay | Admitting: Pharmacist

## 2012-06-03 DIAGNOSIS — J45909 Unspecified asthma, uncomplicated: Secondary | ICD-10-CM

## 2012-06-03 DIAGNOSIS — F0390 Unspecified dementia without behavioral disturbance: Secondary | ICD-10-CM

## 2012-06-08 ENCOUNTER — Non-Acute Institutional Stay: Payer: Self-pay | Admitting: Family Medicine

## 2012-06-08 DIAGNOSIS — R269 Unspecified abnormalities of gait and mobility: Secondary | ICD-10-CM

## 2012-06-08 DIAGNOSIS — F0390 Unspecified dementia without behavioral disturbance: Secondary | ICD-10-CM

## 2012-06-09 ENCOUNTER — Other Ambulatory Visit: Payer: Self-pay | Admitting: Family Medicine

## 2012-06-09 ENCOUNTER — Non-Acute Institutional Stay: Payer: Self-pay | Admitting: Family Medicine

## 2012-06-09 MED ORDER — THEOPHYLLINE ER 100 MG PO TB12: 100.0000 mg | ORAL_TABLET | Freq: Two times a day (BID) | ORAL | Status: DC

## 2012-06-09 NOTE — Progress Notes (Signed)
Error

## 2012-06-09 NOTE — Telephone Encounter (Signed)
Changed theophylline to 100 mg BID from total of 300 mg daily due to well controled asthma.

## 2012-06-29 DIAGNOSIS — Z993 Dependence on wheelchair: Secondary | ICD-10-CM | POA: Insufficient documentation

## 2012-06-29 NOTE — Assessment & Plan Note (Signed)
Trying to improve memory of inability to safely walk alone by adding Nemenda.

## 2012-06-29 NOTE — Assessment & Plan Note (Signed)
Unstable with recurrent falls

## 2012-06-29 NOTE — Progress Notes (Signed)
Patient ID: Connie Fitzpatrick, female   DOB: 1927/07/05, 79 y.o.   MRN: [id number] Still falling. Doesn't call the staff to help her. Says she missed the bed when sitting down.   Contusion left face. Oriented x 3.

## 2012-07-07 ENCOUNTER — Emergency Department (HOSPITAL_COMMUNITY): Payer: Medicare Other

## 2012-07-07 ENCOUNTER — Inpatient Hospital Stay (HOSPITAL_COMMUNITY)
Admission: EM | Admit: 2012-07-07 | Discharge: 2012-07-12 | DRG: 315 | Disposition: A | Discharge status: Skilled Nursing Facility | Payer: Medicare Other | Attending: Family Medicine | Admitting: Family Medicine

## 2012-07-07 ENCOUNTER — Encounter: Payer: Self-pay | Admitting: Family Medicine

## 2012-07-07 ENCOUNTER — Encounter (HOSPITAL_COMMUNITY): Payer: Self-pay | Admitting: *Deleted

## 2012-07-07 ENCOUNTER — Non-Acute Institutional Stay: Payer: Self-pay | Admitting: Family Medicine

## 2012-07-07 DIAGNOSIS — I10 Essential (primary) hypertension: Secondary | ICD-10-CM | POA: Diagnosis present

## 2012-07-07 DIAGNOSIS — D72829 Elevated white blood cell count, unspecified: Secondary | ICD-10-CM | POA: Diagnosis present

## 2012-07-07 DIAGNOSIS — G8929 Other chronic pain: Secondary | ICD-10-CM | POA: Diagnosis present

## 2012-07-07 DIAGNOSIS — F3289 Other specified depressive episodes: Secondary | ICD-10-CM | POA: Diagnosis present

## 2012-07-07 DIAGNOSIS — E2749 Other adrenocortical insufficiency: Secondary | ICD-10-CM | POA: Diagnosis present

## 2012-07-07 DIAGNOSIS — K219 Gastro-esophageal reflux disease without esophagitis: Secondary | ICD-10-CM | POA: Diagnosis present

## 2012-07-07 DIAGNOSIS — Z96659 Presence of unspecified artificial knee joint: Secondary | ICD-10-CM

## 2012-07-07 DIAGNOSIS — E876 Hypokalemia: Secondary | ICD-10-CM | POA: Diagnosis not present

## 2012-07-07 DIAGNOSIS — N179 Acute kidney failure, unspecified: Secondary | ICD-10-CM | POA: Diagnosis present

## 2012-07-07 DIAGNOSIS — E785 Hyperlipidemia, unspecified: Secondary | ICD-10-CM | POA: Diagnosis present

## 2012-07-07 DIAGNOSIS — Z79899 Other long term (current) drug therapy: Secondary | ICD-10-CM

## 2012-07-07 DIAGNOSIS — J90 Pleural effusion, not elsewhere classified: Secondary | ICD-10-CM | POA: Diagnosis present

## 2012-07-07 DIAGNOSIS — E872 Acidosis: Secondary | ICD-10-CM

## 2012-07-07 DIAGNOSIS — R32 Unspecified urinary incontinence: Secondary | ICD-10-CM | POA: Diagnosis present

## 2012-07-07 DIAGNOSIS — M6530 Trigger finger, unspecified finger: Secondary | ICD-10-CM | POA: Diagnosis present

## 2012-07-07 DIAGNOSIS — Z9181 History of falling: Secondary | ICD-10-CM

## 2012-07-07 DIAGNOSIS — J45909 Unspecified asthma, uncomplicated: Secondary | ICD-10-CM | POA: Diagnosis present

## 2012-07-07 DIAGNOSIS — F0390 Unspecified dementia without behavioral disturbance: Secondary | ICD-10-CM | POA: Diagnosis present

## 2012-07-07 DIAGNOSIS — IMO0002 Reserved for concepts with insufficient information to code with codable children: Secondary | ICD-10-CM

## 2012-07-07 DIAGNOSIS — M069 Rheumatoid arthritis, unspecified: Secondary | ICD-10-CM

## 2012-07-07 DIAGNOSIS — R4182 Altered mental status, unspecified: Secondary | ICD-10-CM

## 2012-07-07 DIAGNOSIS — R269 Unspecified abnormalities of gait and mobility: Secondary | ICD-10-CM

## 2012-07-07 DIAGNOSIS — F329 Major depressive disorder, single episode, unspecified: Secondary | ICD-10-CM | POA: Diagnosis present

## 2012-07-07 DIAGNOSIS — F1010 Alcohol abuse, uncomplicated: Secondary | ICD-10-CM

## 2012-07-07 DIAGNOSIS — E039 Hypothyroidism, unspecified: Secondary | ICD-10-CM

## 2012-07-07 DIAGNOSIS — M109 Gout, unspecified: Secondary | ICD-10-CM | POA: Diagnosis present

## 2012-07-07 DIAGNOSIS — I4892 Unspecified atrial flutter: Secondary | ICD-10-CM | POA: Diagnosis present

## 2012-07-07 DIAGNOSIS — I959 Hypotension, unspecified: Principal | ICD-10-CM | POA: Diagnosis present

## 2012-07-07 DIAGNOSIS — E119 Type 2 diabetes mellitus without complications: Secondary | ICD-10-CM | POA: Diagnosis present

## 2012-07-07 DIAGNOSIS — M1990 Unspecified osteoarthritis, unspecified site: Secondary | ICD-10-CM | POA: Diagnosis present

## 2012-07-07 HISTORY — DX: Other chronic pain: G89.29

## 2012-07-07 HISTORY — DX: Unspecified dementia, unspecified severity, without behavioral disturbance, psychotic disturbance, mood disturbance, and anxiety: F03.90

## 2012-07-07 HISTORY — DX: Traumatic subdural hemorrhage with loss of consciousness status unknown, initial encounter: S06.5XAA

## 2012-07-07 HISTORY — DX: Repeated falls: R29.6

## 2012-07-07 HISTORY — DX: Perforation of intestine (nontraumatic): K63.1

## 2012-07-07 HISTORY — DX: Traumatic subdural hemorrhage with loss of consciousness of unspecified duration, initial encounter: S06.5X9A

## 2012-07-07 LAB — URINE MICROSCOPIC-ADD ON

## 2012-07-07 LAB — URINALYSIS, ROUTINE W REFLEX MICROSCOPIC
Hgb urine dipstick: NEGATIVE
Protein, ur: 30 mg/dL — AB
Urobilinogen, UA: 0.2 mg/dL (ref 0.0–1.0)

## 2012-07-07 LAB — TROPONIN I: Troponin I: <0.3 ng/mL (ref <0.30)

## 2012-07-07 LAB — MRSA PCR SCREENING: MRSA by PCR: NEGATIVE

## 2012-07-07 LAB — CBC WITH DIFFERENTIAL/PLATELET
Basophils Absolute: 0 10*3/uL (ref 0.0–0.1)
HCT: 35.4 % — ABNORMAL LOW (ref 36.0–46.0)
Hemoglobin: 11.5 g/dL — ABNORMAL LOW (ref 12.0–15.0)
Lymphocytes Relative: 11 % — ABNORMAL LOW (ref 12–46)
Lymphs Abs: 1.2 10*3/uL (ref 0.7–4.0)
Monocytes Absolute: 1.1 10*3/uL — ABNORMAL HIGH (ref 0.1–1.0)
Monocytes Relative: 10 % (ref 3–12)
Neutro Abs: 8.8 10*3/uL — ABNORMAL HIGH (ref 1.7–7.7)
RBC: 3.69 MIL/uL — ABNORMAL LOW (ref 3.87–5.11)
WBC: 11.1 10*3/uL — ABNORMAL HIGH (ref 4.0–10.5)

## 2012-07-07 LAB — CG4 I-STAT (LACTIC ACID)
Lactic Acid, Venous: 3.46 mmol/L — ABNORMAL HIGH (ref 0.5–2.2)
Lactic Acid, Venous: 3.53 mmol/L — ABNORMAL HIGH (ref 0.5–2.2)

## 2012-07-07 LAB — COMPREHENSIVE METABOLIC PANEL
AST: 15 U/L (ref 0–37)
BUN: 43 mg/dL — ABNORMAL HIGH (ref 6–23)
CO2: 25 mEq/L (ref 19–32)
Chloride: 104 mEq/L (ref 96–112)
Creatinine, Ser: 1.46 mg/dL — ABNORMAL HIGH (ref 0.50–1.10)
GFR calc non Af Amer: 32 mL/min — ABNORMAL LOW (ref >90)
Total Bilirubin: 0.3 mg/dL (ref 0.3–1.2)

## 2012-07-07 LAB — RAPID URINE DRUG SCREEN, HOSP PERFORMED: Barbiturates: NOT DETECTED

## 2012-07-07 MED ORDER — HEPARIN SODIUM (PORCINE) 5000 UNIT/ML IJ SOLN
5000.0000 [IU] | Freq: Three times a day (TID) | INTRAMUSCULAR | Status: DC
  Administered 2012-07-07 – 2012-07-12 (×15): 5000 [IU] via SUBCUTANEOUS
  Filled 2012-07-07 (×17): qty 1

## 2012-07-07 MED ORDER — SODIUM CHLORIDE 0.9 % IV SOLN
INTRAVENOUS | Status: DC
  Administered 2012-07-08 – 2012-07-10 (×2): via INTRAVENOUS

## 2012-07-07 MED ORDER — SODIUM CHLORIDE 0.9 % IJ SOLN
3.0000 mL | Freq: Two times a day (BID) | INTRAMUSCULAR | Status: DC
  Administered 2012-07-07 – 2012-07-12 (×7): 3 mL via INTRAVENOUS

## 2012-07-07 MED ORDER — SODIUM CHLORIDE 0.9 % IV BOLUS (SEPSIS)
1000.0000 mL | Freq: Once | INTRAVENOUS | Status: AC
  Administered 2012-07-07: 1000 mL via INTRAVENOUS

## 2012-07-07 MED ORDER — ASPIRIN 300 MG RE SUPP
300.0000 mg | Freq: Every day | RECTAL | Status: DC
  Administered 2012-07-07 – 2012-07-09 (×3): 300 mg via RECTAL
  Filled 2012-07-07 (×3): qty 1

## 2012-07-07 MED ORDER — VANCOMYCIN HCL IN DEXTROSE 1-5 GM/200ML-% IV SOLN: 1000.0000 mg | Freq: Once | INTRAVENOUS | Status: DC

## 2012-07-07 MED ORDER — SODIUM CHLORIDE 0.9 % IV SOLN
Freq: Once | INTRAVENOUS | Status: AC
  Administered 2012-07-07: 18:00:00 via INTRAVENOUS

## 2012-07-07 MED ORDER — PIPERACILLIN-TAZOBACTAM 3.375 G IVPB
3.3750 g | Freq: Once | INTRAVENOUS | Status: AC
  Administered 2012-07-07: 3.375 g via INTRAVENOUS
  Filled 2012-07-07: qty 50

## 2012-07-07 NOTE — ED Notes (Signed)
Pt continues to be very drowsy, will only respond to painful stimuli. Family remains at bedside.

## 2012-07-07 NOTE — ED Notes (Signed)
Admitting MD at bedside for assessment

## 2012-07-07 NOTE — Progress Notes (Signed)
Subjective:     Patient ID: Connie Fitzpatrick, female   DOB: 19-Mar-1928, 79 y.o.   MRN: [id number]  HPI Received call from nursing home that patient had SOB with chest pain. She was awake and alert to person. Patient with new cough non productive since 0400. No wheezing.  Vitals:  Filed Vitals:   07/07/12 1145  BP: 105/67  Pulse: 99  Resp: 22  SpO2: 85% on RA    Ordered duobneb.   No improvement in O2 sats following duoneb and BP down to 70/40.   Asked for patient to be transferred to the ED.   Saw patient in ED. She states that her CP which was substernal has resolved and she is no longer having CP. Her daughter-in-law is in the ED with her. Per daughter-in-law patient has had increased reflux.   Review of Systems As per HPI     Objective:   Physical Exam VS: BP 92/47, HR 87, RR 18, O2Sat 98% on 6 L [place].  General appearance: awake, alert. No acute distress.  Lungs: normal WOB. Crackles at RL base.  Heart: regular rate and rhythm, S1, S2 normal, no murmur, click, rub or gallop Extremities: No edema. Dressing on R anterior shin.  Neurologic: Grossly normal     Assessment and Plan:         79 yo F nursing home patient with history of asthma, RA, dementia and recurrent falls attrribtuted to dementia/poor insight with acute SOB and chest pain.  1. SOB and CP:  A: Chest pain resolved. Likely GERD. Recommend EKG and CXR to evaluate. Patient also at high risk for PE given relatively sedentary lifestyle so a CTA should also be considered.  P: Will follow.  Family Medicine Teaching Service admitting Team if admission recommended.

## 2012-07-07 NOTE — ED Provider Notes (Signed)
79 year old female was sent from nursing home because of decreased level of consciousness. In the ED, she was noted to be hypotensive and warm to touch although temperature has not been able to be obtained at this time. Lungs are clear and heart has a regular rate and rhythm and extremities no edema. She responds to painful stimuli but has no obvious purposeful movement. Her son is here states that she is DO NOT RESUSCITATE and does not wish her intubated but does wish fluids and pressors be given if needed as well as antibiotics. Septic workup has been initiated as well as IV fluid challenge and she'll be started on antibiotics.   Date: 07/07/2012  Rate: 87  Rhythm: normal sinus rhythm and premature ventricular contractions (PVC)  QRS Axis: normal  Intervals: normal  ST/T Wave abnormalities: normal  Conduction Disutrbances:none  Narrative Interpretation: Sinus rhythm with PVC, left atrial hypertrophy. When compared with ECG of 10/26/2011, no significant changes are seen.  Old EKG Reviewed: unchanged  CRITICAL CARE Performed by: [email or web address]   Total critical care time:  Critical care time was exclusive of separately billable procedures and treating other patients.  Critical care was necessary to treat or prevent imminent or life-threatening deterioration.  Critical care was time spent personally by me on the following activities: development of treatment plan with patient and/or surrogate as well as nursing, discussions with consultants, evaluation of patient's response to treatment, examination of patient, obtaining history from patient or surrogate, ordering and performing treatments and interventions, ordering and review of laboratory studies, ordering and review of radiographic studies, pulse oximetry and re-evaluation of patient's condition.   I saw and evaluated the patient, reviewed the resident's note and I agree with the findings and plan.    Dione Booze, MD 07/07/12  [phone]

## 2012-07-07 NOTE — ED Notes (Signed)
Per EMS- pt is from [facility] facility. Pt was noted to be slow to respond but will answer questions. Pt SPO2 with EMS ranged from 76-98%. Pt on 6L [place]. bp 78/64. Pupils pinpoint with EMS. Given 1mg  of Narcan en route with no change in alertness. Pt on tramadol q8hr.

## 2012-07-07 NOTE — ED Provider Notes (Signed)
History     CSN: [id number]  Arrival date & time 07/07/12  1244   First MD Initiated Contact with Patient 07/07/12 1412      Chief Complaint  Patient presents with  . Altered Mental Status    (Consider location/radiation/quality/duration/timing/severity/associated sxs/prior treatment) Patient is a 79 y.o. female presenting with altered mental status. The history is provided by the spouse.  Altered Mental Status This is a new problem. The current episode started today. The problem occurs constantly. The problem has been unchanged. Nothing aggravates the symptoms. She has tried nothing for the symptoms.    Past Medical History  Diagnosis Date  . Gout   . INSOMNIA   . ALLERGIC RHINITIS   . ASTHMA   . OSTEOARTHRITIS   . Rheumatoid arthritis   . HYPERTENSION   . HYPERLIPIDEMIA   . GERD (gastroesophageal reflux disease)   . Depression   . ALCOHOL ABUSE     hx detox, dry since 2010  . HYPOTHYROIDISM   . URINARY INCONTINENCE     Past Surgical History  Procedure Date  . Abdominal hysterectomy   . Breast surgery   . Total knee arthroplasty   . Vaginal prolapse repair   . Total knee arthroplasty 07/2002    right  . Total knee arthroplasty 09/1998    Left  . Cataract extraction 2001  . L-spine 1998  . Colpocleisis vaginal [place] 06/2010  . Laparotomy 10/26/2011    Procedure: EXPLORATORY LAPAROTOMY;  Surgeon: Wilmon Arms. Corliss Skains, MD;  Location: [facility] OR;  Service: General;  Laterality: N/A;  . Bowel resection 10/26/2011    Procedure: SMALL BOWEL RESECTION;  Surgeon: Wilmon Arms. Corliss Skains, MD;  Location: [facility] OR;  Service: General;  Laterality: N/A;    Family History  Problem Relation Age of Onset  . Arthritis Mother   . Diabetes Mother   . Hypertension Mother   . Lung cancer Father   . Stroke Father     History  Substance Use Topics  . Smoking status: Never Smoker   . 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 Alcohol Use: No      Comment: HX alcohol abuse    OB History    Grav Para Term Preterm Abortions TAB SAB Ect Mult Living                  Review of Systems  Unable to perform ROS: Mental status change  Psychiatric/Behavioral: Positive for confusion and altered mental status.    Allergies  Allopurinol; Azithromycin; Enalapril maleate; Morphine and related; Nsaids; and Valacyclovir hcl  Home Medications   Current Outpatient Rx  Name  Route  Sig  Dispense  Refill  . ACETAMINOPHEN 500 MG PO TABS   Oral   Take 500 mg by mouth every 6 (six) hours as needed. For pain/fever         . ALBUTEROL SULFATE HFA 108 (90 BASE) MCG/ACT IN AERS   Inhalation   Inhale 2 puffs into the lungs every 4 (four) hours as needed. For shortness of breath         . ALPRAZOLAM 0.5 MG PO TABS   Oral   Take 0.5-1 mg by mouth 2 (two) times daily. Take 0.5 mg in the morning and 1 mg in the evening         . MAGIC MOUTHWASH   Oral   Take 5 mLs by mouth at bedtime.         Marland Kitchen  AZELASTINE HCL 137 MCG/SPRAY NA SOLN   Nasal   Place 2 sprays into the nose 2 (two) times daily. Use in each nostril as directed   30 mL   2   . BISACODYL 5 MG PO TBEC   Oral   Take 5 mg by mouth at bedtime.         Marland Kitchen CALTRATE 600 PLUS-VIT D PO   Oral   Take 1 tablet by mouth every evening.         Marland Kitchen COLCHICINE 0.6 MG PO TABS   Oral   Take 0.6 mg by mouth daily with breakfast.         . DONEPEZIL HCL 10 MG PO TABS   Oral   Take 10 mg by mouth daily with breakfast.         . DULOXETINE HCL 60 MG PO CPEP   Oral   Take 60 mg by mouth at bedtime.         Marland Kitchen FAMOTIDINE 20 MG PO TABS   Oral   Take 1 tablet (20 mg total) by mouth at bedtime.         . FEBUXOSTAT 40 MG PO TABS   Oral   Take 40 mg by mouth daily with breakfast.         . FUROSEMIDE 40 MG PO TABS   Oral   Take 20 mg by mouth daily.         Marland Kitchen GABAPENTIN 300 MG PO CAPS   Oral   Take 300 mg by mouth 2 (two) times daily.         . GUAIFENESIN  100 MG/5ML PO LIQD   Oral   Take 100 mg by mouth 3 (three) times daily as needed. For congestion         . HORSE CHESTNUT ER 300 MG PO CPCR   Oral   Take 300 mg by mouth every evening.         . IPRATROPIUM-ALBUTEROL 0.5-2.5 (3) MG/3ML IN SOLN   Nebulization   Take 3 mLs by nebulization every 6 (six) hours as needed. For shortness of breath         . LEVOTHYROXINE SODIUM 100 MCG PO TABS   Oral   Take 100 mcg by mouth daily with breakfast.         . LORATADINE 10 MG PO TABS   Oral   Take 10 mg by mouth daily.         Marland Kitchen MAGNESIUM HYDROXIDE 400 MG/5ML PO SUSP   Oral   Take 30 mLs by mouth daily as needed. For constipation         . MELATONIN 5 MG PO CAPS   Oral   Take 5 mg by mouth at bedtime.          Marland Kitchen MEMANTINE HCL 10 MG PO TABS   Oral   Take 1 tablet (10 mg total) by mouth daily.         Marland Kitchen METOPROLOL SUCCINATE ER 100 MG PO TB24   Oral   Take 100 mg by mouth at bedtime. Take with or immediately following a meal.         . ADULT MULTIVITAMIN W/MINERALS CH   Oral   Take 1 tablet by mouth at bedtime.         . OCUVITE-LUTEIN PO CAPS   Oral   Take 1 capsule by mouth daily with breakfast.         .  POTASSIUM CHLORIDE ER 10 MEQ PO TBCR   Oral   Take 10 mEq by mouth daily with breakfast.          . PREDNISONE 5 MG PO TABS   Oral   Take 5 mg by mouth daily.         . THEOPHYLLINE ER 100 MG PO TB12   Oral   Take 1 tablet (100 mg total) by mouth 2 (two) times daily.         . TRAMADOL HCL 50 MG PO TABS   Oral   Take 50 mg by mouth every 8 (eight) hours as needed. For pain   30 tablet   0   . VITAMIN D (ERGOCALCIFEROL) 50000 UNITS PO CAPS   Oral   Take 50,000 Units by mouth every 30 (thirty) days.         Marland Kitchen VITAMIN E 400 UNITS PO CAPS   Oral   Take 400 Units by mouth every evening.            BP 76/43  Pulse 82  Temp 98.8 F (37.1 C) (Rectal)  Resp 18  SpO2 93%  Physical Exam  Nursing note and vitals  reviewed. Constitutional: She appears well-developed and well-nourished. She appears lethargic. No distress.  HENT:  Head: Normocephalic and atraumatic.  Mouth/Throat: Oropharynx is clear and moist.  Eyes: EOM are normal.       Pinpoint pupils equally  Neck: Normal range of motion. Neck supple.  Cardiovascular: Normal rate, regular rhythm, normal heart sounds and intact distal pulses.   Pulmonary/Chest: Effort normal and breath sounds normal. She has no wheezes. She has no rales.  Abdominal: Soft. Bowel sounds are normal. She exhibits no distension. There is no tenderness. There is no rebound and no guarding.  Musculoskeletal: Normal range of motion. She exhibits no edema and no tenderness.  Lymphadenopathy:    She has no cervical adenopathy.  Neurological: She appears lethargic. She displays normal reflexes. No cranial nerve deficit. She exhibits normal muscle tone. Coordination normal. GCS eye subscore is 3. GCS verbal subscore is 2. GCS motor subscore is 6.  Skin: Skin is warm and dry. No rash noted.  Psychiatric: She has a normal mood and affect. Her behavior is normal.    ED Course  Procedures (including critical care time)   Date: 07/07/2012  Rate: 87  Rhythm: normal sinus rhythm with pvc's  QRS Axis: normal  Intervals: normal  ST/T Wave abnormalities: nonspecific ST changes and nonspecific T wave changes  Conduction Disutrbances:none  Narrative Interpretation:   Old EKG Reviewed: unchanged    Labs Reviewed  CBC WITH DIFFERENTIAL - Abnormal; Notable for the following:    WBC 11.1 (*)     RBC 3.69 (*)     Hemoglobin 11.5 (*)     HCT 35.4 (*)     RDW 16.1 (*)     Neutrophils Relative 79 (*)     Neutro Abs 8.8 (*)     Lymphocytes Relative 11 (*)     Monocytes Absolute 1.1 (*)     All other components within normal limits  COMPREHENSIVE METABOLIC PANEL - Abnormal; Notable for the following:    Glucose, Bld 104 (*)     BUN 43 (*)     Creatinine, Ser 1.46 (*)      Total Protein 5.0 (*)     Albumin 2.6 (*)     GFR calc non Af Amer 32 (*)     GFR calc  Af Amer 37 (*)     All other components within normal limits  CG4 I-STAT (LACTIC ACID) - Abnormal; Notable for the following:    Lactic Acid, Venous 3.46 (*)     All other components within normal limits  TROPONIN I  URINALYSIS, ROUTINE W REFLEX MICROSCOPIC  CULTURE, BLOOD (ROUTINE X 2)  CULTURE, BLOOD (ROUTINE X 2)  URINE RAPID DRUG SCREEN (HOSP PERFORMED)   Dg Chest 2 View  07/07/2012  *RADIOLOGY REPORT*  Clinical Data: Altered mental status.  CHEST - 2 VIEW  Comparison: Portable chest 10/27/2011.  PA and lateral chest 08/16/2010.  Findings: There is cardiomegaly and vascular congestion.  Bibasilar airspace disease appears worse on the left.  There are likely small bilateral pleural effusions.  No pneumothorax is identified.  IMPRESSION:  1.  Cardiomegaly and vascular congestion. 2.  Small bilateral pleural effusions.  Bibasilar airspace disease, worse on the left, as likely due to atelectasis.   Original Report Authenticated By: Holley Dexter, M.D.    Ct Head Wo Contrast  07/07/2012  *RADIOLOGY REPORT*  Clinical Data: In point pupils.  Altered mental status.  CT HEAD WITHOUT CONTRAST  Technique:  Contiguous axial images were obtained from the base of the skull through the vertex without contrast.  Comparison: CT head without contrast 05/25/2012.  Findings: Extensive periventricular subcortical white matter hypoattenuation is similar to the prior exam.  Remote lacunar infarcts are present in the basal ganglia bilaterally.  The ventricles are proportionate to the degree of atrophy.  No significant extra-axial fluid collection is present.  A fluid level is present in the left sphenoid sinus.  The paranasal sinuses and mastoid air cells are clear.  The osseous skull is intact.  Atherosclerotic calcifications are present in the cavernous carotid arteries bilaterally.  IMPRESSION:  1.  No acute intracranial  abnormality or significant interval change. 2.  Atrophy and extensive white matter disease.  This likely reflects the sequelae of chronic microvascular ischemia. 3.  Persistent fluid in the left sphenoid sinus.   Original Report Authenticated By: Marin Roberts, M.D.      1. Hypotension   2. Lactic acid increased   3. Acute renal failure   4. Altered mental status       MDM  18F DNRI/DNI presents from nursing home for altered mental status, hypoxemia, and hypotension that was discovered today. No reported fevers. She has dementia at baseline and is normally unable to hold conversation. Sometimes can feed herself and is wheelchair bound at baseline. Today, she is a GCS 11 with pinpoint pupils. 02 sats 70-80's on RA which is also new for her. BP also low in 70's systolic and normally above 100. EMS gave her Narcan without response. Takes Tramadol and Xanax but has been on these meds for awhile. Exam as noted above. Concern for infection vs overmedication vs cardiac (but less likely does not appear fluid overloaded). Will give IVF, temp, labs, CXR, and head CT.  Labs reveal elevated lactic acid. CXR and head CT without acute processes. Still awaiting urine studies but has been given empiric antibiotics Vanc/Zosyn. BP now 90/40 after 2L IVF. Will get repeat lactate and admit to [facility].        Johnnette Gourd, MD 07/07/12 1723

## 2012-07-08 ENCOUNTER — Encounter (HOSPITAL_COMMUNITY): Payer: Self-pay | Admitting: Physician Assistant

## 2012-07-08 DIAGNOSIS — R609 Edema, unspecified: Secondary | ICD-10-CM

## 2012-07-08 DIAGNOSIS — I4892 Unspecified atrial flutter: Secondary | ICD-10-CM

## 2012-07-08 DIAGNOSIS — I359 Nonrheumatic aortic valve disorder, unspecified: Secondary | ICD-10-CM

## 2012-07-08 DIAGNOSIS — R079 Chest pain, unspecified: Secondary | ICD-10-CM

## 2012-07-08 DIAGNOSIS — N179 Acute kidney failure, unspecified: Secondary | ICD-10-CM

## 2012-07-08 DIAGNOSIS — I10 Essential (primary) hypertension: Secondary | ICD-10-CM

## 2012-07-08 DIAGNOSIS — F0390 Unspecified dementia without behavioral disturbance: Secondary | ICD-10-CM

## 2012-07-08 DIAGNOSIS — F1010 Alcohol abuse, uncomplicated: Secondary | ICD-10-CM

## 2012-07-08 DIAGNOSIS — E872 Acidosis: Secondary | ICD-10-CM

## 2012-07-08 DIAGNOSIS — J9600 Acute respiratory failure, unspecified whether with hypoxia or hypercapnia: Secondary | ICD-10-CM

## 2012-07-08 DIAGNOSIS — R4182 Altered mental status, unspecified: Secondary | ICD-10-CM

## 2012-07-08 LAB — BASIC METABOLIC PANEL
BUN: 34 mg/dL — ABNORMAL HIGH (ref 6–23)
CO2: 25 mEq/L (ref 19–32)
Calcium: 8.7 mg/dL (ref 8.4–10.5)
Chloride: 110 mEq/L (ref 96–112)
Creatinine, Ser: 1.12 mg/dL — ABNORMAL HIGH (ref 0.50–1.10)
GFR calc Af Amer: 52 mL/min — ABNORMAL LOW (ref >90)
GFR calc non Af Amer: 44 mL/min — ABNORMAL LOW (ref >90)
Glucose, Bld: 91 mg/dL (ref 70–99)
Potassium: 3.9 mEq/L (ref 3.5–5.1)
Sodium: 143 mEq/L (ref 135–145)

## 2012-07-08 LAB — COMPREHENSIVE METABOLIC PANEL
ALT: 12 U/L (ref 0–35)
AST: 16 U/L (ref 0–37)
Alkaline Phosphatase: 65 U/L (ref 39–117)
CO2: 24 mEq/L (ref 19–32)
Chloride: 111 mEq/L (ref 96–112)
Creatinine, Ser: 1.17 mg/dL — ABNORMAL HIGH (ref 0.50–1.10)
GFR calc non Af Amer: 42 mL/min — ABNORMAL LOW (ref >90)
Potassium: 4.2 mEq/L (ref 3.5–5.1)
Total Bilirubin: 0.3 mg/dL (ref 0.3–1.2)

## 2012-07-08 LAB — URINALYSIS, ROUTINE W REFLEX MICROSCOPIC
Bilirubin Urine: NEGATIVE
Glucose, UA: NEGATIVE mg/dL
Hgb urine dipstick: NEGATIVE
Ketones, ur: NEGATIVE mg/dL
Nitrite: NEGATIVE
Protein, ur: NEGATIVE mg/dL
Specific Gravity, Urine: 1.022 (ref 1.005–1.030)
Urobilinogen, UA: 0.2 mg/dL (ref 0.0–1.0)
pH: 5 (ref 5.0–8.0)

## 2012-07-08 LAB — CBC
MCH: 30.5 pg (ref 26.0–34.0)
MCHC: 31.6 g/dL (ref 30.0–36.0)
Platelets: 245 10*3/uL (ref 150–400)
RBC: 3.71 MIL/uL — ABNORMAL LOW (ref 3.87–5.11)
RDW: 16.6 % — ABNORMAL HIGH (ref 11.5–15.5)

## 2012-07-08 LAB — THEOPHYLLINE LEVEL: Theophylline Lvl: 4.2 ug/mL — ABNORMAL LOW (ref 10.0–20.0)

## 2012-07-08 LAB — VANCOMYCIN, TROUGH: Vancomycin Tr: <5 ug/mL — ABNORMAL LOW (ref 10.0–20.0)

## 2012-07-08 LAB — PROCALCITONIN: Procalcitonin: 27.99 ng/mL

## 2012-07-08 LAB — LACTIC ACID, PLASMA: Lactic Acid, Venous: 1.5 mmol/L (ref 0.5–2.2)

## 2012-07-08 MED ORDER — DILTIAZEM HCL 100 MG IV SOLR
5.0000 mg/h | INTRAVENOUS | Status: DC
  Administered 2012-07-08: 5 mg/h via INTRAVENOUS
  Administered 2012-07-08 (×2): 10 mg/h via INTRAVENOUS
  Filled 2012-07-08: qty 100

## 2012-07-08 MED ORDER — AMIODARONE IV BOLUS ONLY 150 MG/100ML
150.0000 mg | Freq: Once | INTRAVENOUS | Status: AC
  Administered 2012-07-08: 150 mg via INTRAVENOUS
  Filled 2012-07-08: qty 100

## 2012-07-08 MED ORDER — DILTIAZEM HCL 50 MG/10ML IV SOLN
5.0000 mg | Freq: Once | INTRAVENOUS | Status: DC
  Filled 2012-07-08: qty 1

## 2012-07-08 MED ORDER — DILTIAZEM 12 MG/ML ORAL SUSPENSION
30.0000 mg | Freq: Four times a day (QID) | ORAL | Status: DC
  Filled 2012-07-08 (×4): qty 3

## 2012-07-08 MED ORDER — DILTIAZEM LOAD VIA INFUSION
5.0000 mg | Freq: Once | INTRAVENOUS | Status: AC
  Administered 2012-07-08: 5 mg via INTRAVENOUS
  Filled 2012-07-08: qty 5

## 2012-07-08 MED ORDER — DILTIAZEM LOAD VIA INFUSION
5.0000 mg | Freq: Once | INTRAVENOUS | Status: DC | PRN
  Filled 2012-07-08: qty 5

## 2012-07-08 MED ORDER — VANCOMYCIN HCL IN DEXTROSE 1-5 GM/200ML-% IV SOLN
1000.0000 mg | Freq: Once | INTRAVENOUS | Status: AC
  Administered 2012-07-08: 1000 mg via INTRAVENOUS
  Filled 2012-07-08: qty 200

## 2012-07-08 MED ORDER — METOPROLOL TARTRATE 1 MG/ML IV SOLN
2.5000 mg | INTRAVENOUS | Status: DC | PRN
  Administered 2012-07-08: 2.5 mg via INTRAVENOUS
  Filled 2012-07-08 (×2): qty 5

## 2012-07-08 MED ORDER — DILTIAZEM LOAD VIA INFUSION
5.0000 mg | INTRAVENOUS | Status: DC | PRN
  Administered 2012-07-08: 5 mg via INTRAVENOUS
  Filled 2012-07-08: qty 5

## 2012-07-08 MED ORDER — HYDROCORTISONE SOD SUCCINATE 100 MG IJ SOLR
50.0000 mg | Freq: Four times a day (QID) | INTRAMUSCULAR | Status: DC
  Administered 2012-07-08 – 2012-07-10 (×9): 50 mg via INTRAVENOUS
  Filled 2012-07-08 (×13): qty 1

## 2012-07-08 MED ORDER — DIGOXIN 0.25 MG/ML IJ SOLN
0.5000 mg | Freq: Once | INTRAMUSCULAR | Status: AC
  Administered 2012-07-08: 0.5 mg via INTRAVENOUS
  Filled 2012-07-08: qty 2

## 2012-07-08 MED ORDER — PIPERACILLIN-TAZOBACTAM 3.375 G IVPB
3.3750 g | Freq: Three times a day (TID) | INTRAVENOUS | Status: DC
  Administered 2012-07-08 – 2012-07-09 (×6): 3.375 g via INTRAVENOUS
  Filled 2012-07-08 (×9): qty 50

## 2012-07-08 MED ORDER — VANCOMYCIN HCL 500 MG IV SOLR
500.0000 mg | INTRAVENOUS | Status: DC
  Administered 2012-07-09: 500 mg via INTRAVENOUS
  Filled 2012-07-08 (×2): qty 500

## 2012-07-08 MED ORDER — DILTIAZEM HCL 25 MG/5ML IV SOLN
5.0000 mg | INTRAVENOUS | Status: DC | PRN
  Filled 2012-07-08: qty 5

## 2012-07-08 MED ORDER — DILTIAZEM HCL 100 MG IV SOLR
5.0000 mg/h | INTRAVENOUS | Status: DC
  Administered 2012-07-08: 5 mg/h via INTRAVENOUS
  Filled 2012-07-08: qty 100

## 2012-07-08 NOTE — Progress Notes (Signed)
FMTS Attending Admit Note Patient seen and examined by me in Step-Down Unit, discussed with Dr Berline Chough and I agree with his assessment and plan for this patient. Briefly, 84yoF who was found non-responsive at her SNF.  Has been hypotensive since arrival to ED, with accompanying hypoxia.  Responded initially to fluid bolus resuscitation although maintaining a low blood pressure.  At the time of my visit (Feb 6 @1015pm ) she is alert and responsive, denying any pain.  Not oriented to place ("home", which she gives as [place], [place]) or time.  SHe is pale and frail-appearing.  I am aware of the family's wishes for DNR/I status with the exception of blood pressure support.  Will continue empiric abx coverage for sepsis and close observation, gently fluid-resuscitate. Paula Compton, MD

## 2012-07-08 NOTE — Progress Notes (Signed)
Echocardiogram 2D Echocardiogram has been performed.  [name] 07/08/2012, 10:18 AM

## 2012-07-08 NOTE — Progress Notes (Signed)
Seen, examined and discussed in rounds.  Agree with Dr. Rigby's documentation and management. 

## 2012-07-08 NOTE — Consult Note (Signed)
CARDIOLOGY CONSULT NOTE  Patient ID: Connie Fitzpatrick, MRN: [id number], DOB/AGE: 77-Nov-1929 77 y.o. Admit date: 07/07/2012   Date of Consult: 07/08/2012 Primary Physician: Despina Hick, MD Primary Cardiologist: New to [facility]. Being seen by Dr. Daleen Squibb  Chief Complaint: altered mental status Reason for Consult: atrial flutter with RVR  HPI:  Connie Fitzpatrick is an 79 y/o F with no known cardiac history but history of prior alcoholism, recurrent falls, asthma, hypothyroidism, dementia, and chronic pain from rheumatoid arthritis who presented to [facility] from [facility] with decreased consciousness, hypotension, SOB with chest pain prior to the event, and nonproductive cough. The patient is a very poor historian and unable to provide very much history. She does not remember why she is here. In the ER, she ws found to be hypotensive with BP 78/64, hypoxic with SPO2 by EMS 76-98% requiring O2 supplementation, pinpoint pupils, and somnolent. EKG initially unrevealing. EMS gave Narcan without response. She was given IV fluids with improvement in her mental status and improved BP to 90-100 systolic. Labs signficant for WBC 11->19k, lactic acid 3.53, d-dimer 1.32, troponin neg x 2, Cr 1.46->1.17, UDS + for amphetamines (unclear reason why) and benzos (on Xanax PTA), UA with many yeast/small bili/ketones/protein/leuks, blood cx pending. CT head with chronic microvascular changes, no acute abnormality. CXR with cardiomegaly, vasc congestion, small bilat effusions, likely bibasilar atx. She has been started on vancomycin and zosyn. 2D echo, LE duplex pending.  Overnight around 3 am she went into an irregular atrial flutter with controlled rates and later converted on her own. Around 4am, she developed atrial flutter with 2:1 conduction that ceased just after 6am. Interspersed during these episodes she would convert to NSR with PACs, but with post-termination pauses of 2.6-4.2 seconds. It is not clear that she was  symptomatic with this. She denies any awareness of palpitations, syncope, or presyncope. She received a dose of 5mg  IV cardizem at 5:30am and was started on a gtt at 5mg /hr. She remains in NSR at present.  No fever, but she is on chronic steroid therapy.  Past Medical History  Diagnosis Date  . Gout   . INSOMNIA   . ALLERGIC RHINITIS   . ASTHMA   . OSTEOARTHRITIS   . Rheumatoid arthritis   . HYPERTENSION   . HYPERLIPIDEMIA   . GERD (gastroesophageal reflux disease)   . Depression   . ALCOHOL ABUSE     hx detox, dry since 2010  . HYPOTHYROIDISM   . URINARY INCONTINENCE   . Dementia   . Recurrent falls   . SDH (subdural hematoma)     a. 2008 in setting of fall.  . Chronic pain   . Perforated small intestine     a. From swallowed foreign body - s/p exlap with small bowel resection 09/2011.      Most Recent Cardiac Studies: None   Surgical History:  Past Surgical History  Procedure Date  . Abdominal hysterectomy   . Breast surgery   . Total knee arthroplasty   . Vaginal prolapse repair   . Total knee arthroplasty 07/2002    right  . Total knee arthroplasty 09/1998    Left  . Cataract extraction 2001  . L-spine 1998  . Colpocleisis vaginal le fort 06/2010  . Laparotomy 10/26/2011    Procedure: EXPLORATORY LAPAROTOMY;  Surgeon: Wilmon Arms. Corliss Skains, MD;  Location: [facility] OR;  Service: General;  Laterality: N/A;  . Bowel resection 10/26/2011    Procedure: SMALL BOWEL RESECTION;  Surgeon:  Wilmon Arms. Corliss Skains, MD;  Location: [facility] OR;  Service: General;  Laterality: N/A;     Home Meds: Prior to Admission medications   Medication Sig Start Date End Date Taking? Authorizing Provider  acetaminophen (TYLENOL) 500 MG tablet Take 500 mg by mouth every 6 (six) hours as needed. For pain/fever   Yes Historical Provider, MD  albuterol (PROVENTIL HFA;VENTOLIN HFA) 108 (90 BASE) MCG/ACT inhaler Inhale 2 puffs into the lungs every 4 (four) hours as needed. For shortness of breath 06/03/12  Yes Tobin Chad, MD  ALPRAZolam Prudy Feeler) 0.5 MG tablet Take 0.5-1 mg by mouth 2 (two) times daily. Take 0.5 mg in the morning and 1 mg in the evening 07/27/11  Yes Newt Lukes, MD  Alum & Mag Hydroxide-Simeth (MAGIC MOUTHWASH) SOLN Take 5 mLs by mouth at bedtime.   Yes Historical Provider, MD  azelastine (ASTELIN) 137 MCG/SPRAY nasal spray Place 2 sprays into the nose 2 (two) times daily. Use in each nostril as directed 11/04/11 11/03/12 Yes Blenda Mounts, NP  bisacodyl (DULCOLAX) 5 MG EC tablet Take 5 mg by mouth at bedtime.   Yes Historical Provider, MD  Calcium-Vitamin D (CALTRATE 600 PLUS-VIT D PO) Take 1 tablet by mouth every evening.   Yes Historical Provider, MD  colchicine 0.6 MG tablet Take 0.6 mg by mouth daily with breakfast.   Yes Historical Provider, MD  donepezil (ARICEPT) 10 MG tablet Take 10 mg by mouth daily with breakfast.   Yes Historical Provider, MD  DULoxetine (CYMBALTA) 60 MG capsule Take 60 mg by mouth at bedtime.   Yes Historical Provider, MD  famotidine (PEPCID) 20 MG tablet Take 1 tablet (20 mg total) by mouth at bedtime. 05/06/12  Yes Brent Bulla, MD  febuxostat (ULORIC) 40 MG tablet Take 40 mg by mouth daily with breakfast.   Yes Historical Provider, MD  furosemide (LASIX) 40 MG tablet Take 20 mg by mouth daily. 05/20/11  Yes Newt Lukes, MD  gabapentin (NEURONTIN) 300 MG capsule Take 300 mg by mouth 2 (two) times daily.   Yes Historical Provider, MD  guaiFENesin (ROBITUSSIN) 100 MG/5ML liquid Take 100 mg by mouth 3 (three) times daily as needed. For congestion   Yes Historical Provider, MD  [place] (VENASTAT) 300 MG CPCR Take 300 mg by mouth every evening.   Yes Historical Provider, MD  ipratropium-albuterol (DUONEB) 0.5-2.5 (3) MG/3ML SOLN Take 3 mLs by nebulization every 6 (six) hours as needed. For shortness of breath   Yes Historical Provider, MD  levothyroxine (SYNTHROID, LEVOTHROID) 100 MCG tablet Take 100 mcg by mouth daily with breakfast.   Yes  Historical Provider, MD  loratadine (CLARITIN) 10 MG tablet Take 10 mg by mouth daily. 02/08/12  Yes Historical Provider, MD  magnesium hydroxide (MILK OF MAGNESIA) 400 MG/5ML suspension Take 30 mLs by mouth daily as needed. For constipation   Yes Historical Provider, MD  Melatonin 5 MG CAPS Take 5 mg by mouth at bedtime.    Yes Historical Provider, MD  memantine (NAMENDA) 10 MG tablet Take 1 tablet (10 mg total) by mouth daily. 06/03/12  Yes Tobin Chad, MD  metoprolol succinate (TOPROL-XL) 100 MG 24 hr tablet Take 100 mg by mouth at bedtime. Take with or immediately following a meal.   Yes Historical Provider, MD  Multiple Vitamin (MULITIVITAMIN WITH MINERALS) TABS Take 1 tablet by mouth at bedtime.   Yes Historical Provider, MD  multivitamin-lutein (OCUVITE-LUTEIN) CAPS Take 1 capsule by mouth daily with breakfast.  Yes Historical Provider, MD  potassium chloride (K-DUR) 10 MEQ tablet Take 10 mEq by mouth daily with breakfast.    Yes Historical Provider, MD  predniSONE (DELTASONE) 5 MG tablet Take 5 mg by mouth daily. 09/24/11  Yes Newt Lukes, MD  theophylline (THEODUR) 100 MG 12 hr tablet Take 1 tablet (100 mg total) by mouth 2 (two) times daily. 06/09/12  Yes Josalyn Funches, MD  traMADol (ULTRAM) 50 MG tablet Take 50 mg by mouth every 8 (eight) hours as needed. For pain 05/20/12  Yes Brent Bulla, MD  Vitamin D, Ergocalciferol, (DRISDOL) 50000 UNITS CAPS Take 50,000 Units by mouth every 30 (thirty) days.   Yes Historical Provider, MD  vitamin E 400 UNIT capsule Take 400 Units by mouth every evening.    Yes Historical Provider, MD    Inpatient Medications:    . aspirin  300 mg Rectal Daily  . heparin  5,000 Units Subcutaneous Q8H  . hydrocortisone sod succinate (SOLU-CORTEF) injection  50 mg Intravenous Q6H  . piperacillin-tazobactam (ZOSYN)  IV  3.375 g Intravenous Q8H  . sodium chloride  3 mL Intravenous Q12H  . vancomycin  500 mg Intravenous Q24H  . vancomycin  1,000 mg  Intravenous Once    Allergies:  Allergies  Allergen Reactions  . Allopurinol     REACTION: Skin rash  . Azithromycin     Unknown  . Enalapril Maleate     REACTION: swelling of mouth \\T \ throat  . Morphine And Related Other (See Comments)    Possible hallucinations ( 07/12) skin crawling off of body  . Nsaids     Ulcer on EGD 01/01/10 - stopped celebrex  . Valacyclovir Hcl     Unknown    History   Social History  . Marital Status: Married    Spouse Name: N/A    Number of Children: N/A  . Years of Education: N/A   Occupational History  . Not on file.   Social History Main Topics  . Smoking status: Never Smoker   . Smokeless tobacco: Not on file  . Alcohol Use: No     Comment: Previous hx of alcohol abuse  . Drug Use: No  . Sexually Active: Not Currently   Other Topics Concern  . Not on file   Social History Narrative  . No narrative on file     Family History  Problem Relation Age of Onset  . Arthritis Mother   . Diabetes Mother   . Hypertension Mother   . Lung cancer Father   . Stroke Father      Review of Systems: Limited given patient's mental status. Please see above.  Labs:  [facility] 07/08/12 0459 07/07/12 1441  CKTOTAL -- --  CKMB -- --  TROPONINI <0.30 <0.30   Lab Results  Component Value Date   WBC 19.4* 07/08/2012   HGB 11.3* 07/08/2012   HCT 35.8* 07/08/2012   MCV 96.5 07/08/2012   PLT 245 07/08/2012    Lab 07/08/12 0459  NA 145  K 4.2  CL 111  CO2 24  BUN 38*  CREATININE 1.17*  CALCIUM 8.6  PROT 5.0*  BILITOT 0.3  ALKPHOS 65  ALT 12  AST 16  GLUCOSE 74   Lab Results  Component Value Date   CHOL 190 12/04/2010   HDL 46 12/04/2010   LDLCALC 161* 12/04/2010   TRIG 179* 12/04/2010   Lab Results  Component Value Date   DDIMER 1.32* 07/08/2012    Radiology/Studies:  Dg Chest 2 View 07/07/2012  *RADIOLOGY REPORT*  Clinical Data: Altered mental status.  CHEST - 2 VIEW  Comparison: Portable chest 10/27/2011.  PA and lateral chest  08/16/2010.  Findings: There is cardiomegaly and vascular congestion.  Bibasilar airspace disease appears worse on the left.  There are likely small bilateral pleural effusions.  No pneumothorax is identified.  IMPRESSION:  1.  Cardiomegaly and vascular congestion. 2.  Small bilateral pleural effusions.  Bibasilar airspace disease, worse on the left, as likely due to atelectasis.   Original Report Authenticated By: Holley Dexter, M.D.    Ct Head Wo Contrast 07/07/2012  *RADIOLOGY REPORT*  Clinical Data: In point pupils.  Altered mental status.  CT HEAD WITHOUT CONTRAST  Technique:  Contiguous axial images were obtained from the base of the skull through the vertex without contrast.  Comparison: CT head without contrast 05/25/2012.  Findings: Extensive periventricular subcortical white matter hypoattenuation is similar to the prior exam.  Remote lacunar infarcts are present in the basal ganglia bilaterally.  The ventricles are proportionate to the degree of atrophy.  No significant extra-axial fluid collection is present.  A fluid level is present in the left sphenoid sinus.  The paranasal sinuses and mastoid air cells are clear.  The osseous skull is intact.  Atherosclerotic calcifications are present in the cavernous carotid arteries bilaterally.  IMPRESSION:  1.  No acute intracranial abnormality or significant interval change. 2.  Atrophy and extensive white matter disease.  This likely reflects the sequelae of chronic microvascular ischemia. 3.  Persistent fluid in the left sphenoid sinus.   Original Report Authenticated By: Marin Roberts, M.D.     EKG:  2/7 at 4:25am: atrial flutter with 2:1 conduction 134bpm no acute ST-T changes (does appear sinus tach on EKG in places, but tele confirms abrupt increase to 150 range) 2/7 at 6:47am: NSR 82bpm, nonspecific ST-C changes  Physical Exam: Blood pressure 99/54, pulse 82, temperature 97.8 F (36.6 C), temperature source Oral, resp. rate 19, height  5\' 4"  (1.626 m), weight 117 lb 11.6 oz (53.4 kg), SpO2 93.00%. General: Frail elderly WF in no acute distress laying about 20 degrees in bed without resp distress. Head: Normocephalic, atraumatic, sclera non-icteric, no xanthomas, nares are without discharge.  Neck: Negative for carotid bruits. JVD not elevated. Lungs: Clear bilaterally to auscultation without wheezes, rales, or rhonchi. Breathing is unlabored. Heart: RRR with S1 S2. No murmurs, rubs, or gallops appreciated. Abdomen: Soft, non-tender, non-distended with normoactive bowel sounds. No hepatomegaly. No rebound/guarding. No obvious abdominal masses. Msk:  Strength and tone appear normal for age. Extremities: No clubbing or cyanosis. No edema. Mild hyperpigmentation changes LE. Distal pedal pulses are 2+ and equal bilaterally. Hands and toes with RA changes.  Neuro: Alert and oriented to self, knows hospital but not name, knows only parts of the date. No facial asymmetry. No focal deficit. Moves all extremities spontaneously but has difficulty fully opening her L hand due to RA deformity. Psych:  Responds to questions with somewhat confused affect.   Assessment and Plan:  1. Altered mental status, question delirium on dementia - Ddx also includes sepsis, metabolic encephalopathy or hypoperfusion related to hypotension. CT without acute abnormality. Unclear contribution/cause of + amphetamines on UDS.  2. Acute respiratory failure - superimposed on above. Improving. 3. Possible sepsis - Procalcitonin significantly elevated & lactic acid also elevated. Blood cx pending. Note yeast in UA. Further therapies per primary team.  4. Atrial flutter with RVR - first noted occurrence, but also  in the presence of post-termination pauses thus will need to be careful with therapies. May be related to underlying illness. Check TSH. Check echocardiogram. Will discuss further med intervention with MD. Continue low dose IV cardizem for now since we can turn  it off easily if need be for BP, and arrhythmia is quiet at present. With history of SDH & recurrent falls, she is likely not a Coumadin candidate at this time. 5. Chest pain - history is difficult to obtain. D-dimer elevated; may need further workup but renal function may be problematic. Agree with LE dopplers first. In light of underlying picture, will follow for recurrence. Check 2D echocardiogram. 6. Acute renal insufficiency - improving with IVF, but CrCl still reduced. Follow volume status/Cr carefully. 7. HTN - BP currently on softer side. Follow. 8. Severe RA, on chronic steroids - scheduled to start stress dose steroids this AM.  Signed, Dayna Dunn PA-C 07/08/2012, 7:37 AM    I have reviewed history, preformed exam, reviewed clinical data.  I agree with the assessment and plan . CCM wants to hold on IV Cardizem for now. Going in and out of Flutter. Echo pending.  Dawan Farney C. Daleen Squibb, MD, [facility] Pager:  [phone]

## 2012-07-08 NOTE — Consult Note (Signed)
PULMONARY  / CRITICAL CARE MEDICINE  Name: Connie Fitzpatrick MRN: [id number] DOB: Mar 16, 1928    ADMISSION DATE:  07/07/2012 CONSULTATION DATE:  07/08/2012  REFERRING MD :  Berline Chough   CHIEF COMPLAINT:  Hypotension, decreased LOC   BRIEF PATIENT DESCRIPTION:   79 y/o WF Presented to the ED on 2/6 with hypotension, pinpoint pupils, and decreased LOC.  She was given IV fluids and antibiotics were stated with improvement to her BP and mental status.  On 2/7 she experienced A-Fib with RVR and was placed on a cadizem gtt. This was only short term.  PCCM consulted 2/7 to evaluate patient given possible sepsis.    SIGNIFICANT EVENTS / STUDIES:  2/6 Admitted from Nursing home 2/6 CT without acute adnormailty 2/7 A-Fib with RVR   LINES / TUBES: Foley Cath 2/6>>>  CULTURES: MRSA PCS 2/6 - Negative BCx2 2/6>>>    ANTIBIOTICS: Vanc 2/6>>> Zosyn 2/6>>>  HISTORY OF PRESENT ILLNESS:  79 y/o WF with history of prior alcoholism, SDH, recurrent falls, asthma, hypothyroidism, dementia, and chronic pain from rheumatoid arthritis who presented to [facility] from [facility] 2/6 with decreased consciousness, hypotension, SOB with chest pain prior to the event, and nonproductive cough.  Presented to the ED with hypotension, pinpoint pupils, and decreased LOC.  She was given IV fluids and antibiotics were stated with improvement to her BP and mental status.  Her lactic acid was elevated and she was positive for amphetamines (unclear reason why) and benzos (on Xanax PTA).  Her UA showed many yeast/small bili/ketones/protein/leuks. On 2/7 she experienced A-Fib with RVR and was placed on a cadizem gtt.  The patient is a very poor historian and unable to provide very much history.  PAST MEDICAL HISTORY :  Past Medical History  Diagnosis Date  . Gout   . INSOMNIA   . ALLERGIC RHINITIS   . ASTHMA   . OSTEOARTHRITIS   . Rheumatoid arthritis   . HYPERTENSION   . HYPERLIPIDEMIA   . GERD  (gastroesophageal reflux disease)   . Depression   . ALCOHOL ABUSE     hx detox, dry since 2010  . HYPOTHYROIDISM   . URINARY INCONTINENCE   . Dementia   . Recurrent falls   . SDH (subdural hematoma)     a. 2008 in setting of fall.  . Chronic pain   . Perforated small intestine     a. From swallowed foreign body - s/p exlap with small bowel resection 09/2011.   Past Surgical History  Procedure Date  . Abdominal hysterectomy   . Breast surgery   . Total knee arthroplasty   . Vaginal prolapse repair   . Total knee arthroplasty 07/2002    right  . Total knee arthroplasty 09/1998    Left  . Cataract extraction 2001  . L-spine 1998  . Colpocleisis vaginal [place] 06/2010  . Laparotomy 10/26/2011    Procedure: EXPLORATORY LAPAROTOMY;  Surgeon: Wilmon Arms. Corliss Skains, MD;  Location: [facility] OR;  Service: General;  Laterality: N/A;  . Bowel resection 10/26/2011    Procedure: SMALL BOWEL RESECTION;  Surgeon: Wilmon Arms. Corliss Skains, MD;  Location: [facility] OR;  Service: General;  Laterality: N/A;   Prior to Admission medications   Medication Sig Start Date End Date Taking? Authorizing Provider  acetaminophen (TYLENOL) 500 MG tablet Take 500 mg by mouth every 6 (six) hours as needed. For pain/fever   Yes Historical Provider, MD  albuterol (PROVENTIL HFA;VENTOLIN HFA) 108 (90 BASE) MCG/ACT inhaler Inhale 2  puffs into the lungs every 4 (four) hours as needed. For shortness of breath 06/03/12  Yes Tobin Chad, MD  ALPRAZolam Prudy Feeler) 0.5 MG tablet Take 0.5-1 mg by mouth 2 (two) times daily. Take 0.5 mg in the morning and 1 mg in the evening 07/27/11  Yes Newt Lukes, MD  Alum & Mag Hydroxide-Simeth (MAGIC MOUTHWASH) SOLN Take 5 mLs by mouth at bedtime.   Yes Historical Provider, MD  azelastine (ASTELIN) 137 MCG/SPRAY nasal spray Place 2 sprays into the nose 2 (two) times daily. Use in each nostril as directed 11/04/11 11/03/12 Yes Blenda Mounts, NP  bisacodyl (DULCOLAX) 5 MG EC tablet Take 5 mg by mouth at bedtime.    Yes Historical Provider, MD  Calcium-Vitamin D (CALTRATE 600 PLUS-VIT D PO) Take 1 tablet by mouth every evening.   Yes Historical Provider, MD  colchicine 0.6 MG tablet Take 0.6 mg by mouth daily with breakfast.   Yes Historical Provider, MD  donepezil (ARICEPT) 10 MG tablet Take 10 mg by mouth daily with breakfast.   Yes Historical Provider, MD  DULoxetine (CYMBALTA) 60 MG capsule Take 60 mg by mouth at bedtime.   Yes Historical Provider, MD  famotidine (PEPCID) 20 MG tablet Take 1 tablet (20 mg total) by mouth at bedtime. 05/06/12  Yes Brent Bulla, MD  febuxostat (ULORIC) 40 MG tablet Take 40 mg by mouth daily with breakfast.   Yes Historical Provider, MD  furosemide (LASIX) 40 MG tablet Take 20 mg by mouth daily. 05/20/11  Yes Newt Lukes, MD  gabapentin (NEURONTIN) 300 MG capsule Take 300 mg by mouth 2 (two) times daily.   Yes Historical Provider, MD  guaiFENesin (ROBITUSSIN) 100 MG/5ML liquid Take 100 mg by mouth 3 (three) times daily as needed. For congestion   Yes Historical Provider, MD  [place] (VENASTAT) 300 MG CPCR Take 300 mg by mouth every evening.   Yes Historical Provider, MD  ipratropium-albuterol (DUONEB) 0.5-2.5 (3) MG/3ML SOLN Take 3 mLs by nebulization every 6 (six) hours as needed. For shortness of breath   Yes Historical Provider, MD  levothyroxine (SYNTHROID, LEVOTHROID) 100 MCG tablet Take 100 mcg by mouth daily with breakfast.   Yes Historical Provider, MD  loratadine (CLARITIN) 10 MG tablet Take 10 mg by mouth daily. 02/08/12  Yes Historical Provider, MD  magnesium hydroxide (MILK OF MAGNESIA) 400 MG/5ML suspension Take 30 mLs by mouth daily as needed. For constipation   Yes Historical Provider, MD  Melatonin 5 MG CAPS Take 5 mg by mouth at bedtime.    Yes Historical Provider, MD  memantine (NAMENDA) 10 MG tablet Take 1 tablet (10 mg total) by mouth daily. 06/03/12  Yes Tobin Chad, MD  metoprolol succinate (TOPROL-XL) 100 MG 24 hr tablet Take 100 mg by  mouth at bedtime. Take with or immediately following a meal.   Yes Historical Provider, MD  Multiple Vitamin (MULITIVITAMIN WITH MINERALS) TABS Take 1 tablet by mouth at bedtime.   Yes Historical Provider, MD  multivitamin-lutein (OCUVITE-LUTEIN) CAPS Take 1 capsule by mouth daily with breakfast.   Yes Historical Provider, MD  potassium chloride (K-DUR) 10 MEQ tablet Take 10 mEq by mouth daily with breakfast.    Yes Historical Provider, MD  predniSONE (DELTASONE) 5 MG tablet Take 5 mg by mouth daily. 09/24/11  Yes Newt Lukes, MD  theophylline (THEODUR) 100 MG 12 hr tablet Take 1 tablet (100 mg total) by mouth 2 (two) times daily. 06/09/12  Yes Josalyn Funches,  MD  traMADol (ULTRAM) 50 MG tablet Take 50 mg by mouth every 8 (eight) hours as needed. For pain 05/20/12  Yes Brent Bulla, MD  Vitamin D, Ergocalciferol, (DRISDOL) 50000 UNITS CAPS Take 50,000 Units by mouth every 30 (thirty) days.   Yes Historical Provider, MD  vitamin E 400 UNIT capsule Take 400 Units by mouth every evening.    Yes Historical Provider, MD   Allergies  Allergen Reactions  . Allopurinol     REACTION: Skin rash  . Azithromycin     Unknown  . Enalapril Maleate     REACTION: swelling of mouth \\T \ throat  . Morphine And Related Other (See Comments)    Possible hallucinations ( 07/12) skin crawling off of body  . Nsaids     Ulcer on EGD 01/01/10 - stopped celebrex  . Valacyclovir Hcl     Unknown    FAMILY HISTORY:  Family History  Problem Relation Age of Onset  . Arthritis Mother   . Diabetes Mother   . Hypertension Mother   . Lung cancer Father   . Stroke Father    SOCIAL HISTORY:  reports that she has never smoked. She does not have any smokeless tobacco history on file. She reports that she does not drink alcohol or use illicit drugs.  SUBJECTIVE:  Looks better   VITAL SIGNS: Temp:  [97.7 F (36.5 C)-98.8 F (37.1 C)] 98.8 F (37.1 C) (02/07 0800) Pulse Rate:  [35-136] 79  (02/07 0800) Resp:   [14-22] 21  (02/07 0800) BP: (76-109)/(43-70) 99/52 mmHg (02/07 0800) SpO2:  [83 %-98 %] 97 % (02/07 0800) Weight:  [53.4 kg (117 lb 11.6 oz)] 53.4 kg (117 lb 11.6 oz) (02/06 1945) 2 liters       INTAKE / OUTPUT: Intake/Output      02/06 0701 - 02/07 0700 02/07 0701 - 02/08 0700   I.V. (mL/kg) 3144.2 (58.9) 55 (1)   IV Piggyback 50 200   Total Intake(mL/kg) 3194.2 (59.8) 255 (4.8)   Urine (mL/kg/hr) 300 (0.2) 200   Total Output 300 200   Net +2894.2 +55          PHYSICAL EXAMINATION: General:  Patient appears in good spirits and cooperative Neuro:  Patient confused to why she is here. Oriented to self and place.  Follows commands in all extremities. No focal deficits.   HEENT:  Pupils PEARL, moist MM, no JVD. Cardiovascular:  Sinus rhythm. No murmur, gallops. Lungs: Clear but diminished throughout. Abdomen:  Soft, round, non distended Musculoskeletal:  Bilateral lower extremity edema, Lt +3, Rt +2.  Rt upper hand and wrist contracted Skin:  Intact  LABS:  Lab 07/08/12 0640 07/08/12 0459 07/07/12 1755 07/07/12 1512 07/07/12 1441  HGB -- 11.3* -- -- 11.5*  WBC -- 19.4* -- -- 11.1*  PLT -- 245 -- -- 252  NA -- 145 -- -- 140  K -- 4.2 -- -- 3.8  CL -- 111 -- -- 104  CO2 -- 24 -- -- 25  GLUCOSE -- 74 -- -- 104*  BUN -- 38* -- -- 43*  CREATININE -- 1.17* -- -- 1.46*  CALCIUM -- 8.6 -- -- 8.8  MG -- -- -- -- --  PHOS -- -- -- -- --  AST -- 16 -- -- 15  ALT -- 12 -- -- 14  ALKPHOS -- 65 -- -- 65  BILITOT -- 0.3 -- -- 0.3  PROT -- 5.0* -- -- 5.0*  ALBUMIN -- 2.4* -- -- 2.6*  APTT -- -- -- -- --  INR -- -- -- -- --  LATICACIDVEN -- 1.5 3.53* 3.46* --  TROPONINI -- <0.30 -- -- <0.30  PROCALCITON 27.99 -- -- -- --  PROBNP -- -- -- -- --  O2SATVEN -- -- -- -- --  PHART -- -- -- -- --  PCO2ART -- -- -- -- --  PO2ART -- -- -- -- --   No results found for this basename: GLUCAP:5 in the last 168 hours  CXR: 2/6 - Small bilateral pleural effusions. Bibasilar airspace  disease, worse on the left, as likely due to atelectasis   ASSESSMENT / PLAN:  PULMONARY  A:  Bilateral pleural effusions, L>R atx      H/o COPD        R/O HCAP AS SOURCE  P:  F/u CXR       Keep even at this point      pulm hygiene       May be none traditional presentation PNA in elderyl, with atx  CARDIOVASCULAR A: Atrial fibrillation w/ RVR - Patient currently in SR , may have been MAT, clearing lactic acid P:  -d/c cardiazem  - F/u echo -d/c theophylline, assess level  RENAL A:  Acute renal insufficiency - Urine output and SCr improving.   P:   -F/u Chem -Monitor urine output -even  Balance goals -Strict I&O  GASTROINTESTINAL A:  R/o dysphagia P:   -Supportive care Consider slp  HEMATOLOGIC A: Leukocytosis in the setting of sepsis Positive D-Dimer - Patient no showing any clinical signs of PE at this time. P:  -F/u cbc in AM -See ID section - F/u US of lower extremities - F/u Echo  INFECTIOUS A:  Septic Shock - In the setting of unknown source, procalcitonin elevated (28 on 2/7), improving lactic acid.  Patient has improved clinically with IV fluid resuscitation and antibiotics.  R/o HCAP P:   - Cont Empiric IV vanc and zosyn - F/u Cultures - Repeat UA, first not impressive  ENDOCRINE A:   Hypothyroidism Presumed adrenal insuff (chronic pred for RA) DM  P:   chk tsh Agree w/ stress dose roids ssi   NEUROLOGIC A:  H/o dementia P:   Supportive care Avoid benzo  TODAY'S SUMMARY:  79 year old w/ severe sepsis and volume responsive hypotension. Source unclear, but has responded to current rx. LIkely HCAP as source without overt distress, Would get LE dopplers, cont IVFs. Cont abx, repeat U/A and follow cultures. RE: AF w/ RVR, now off cardiazem. Would d/c theophyline. Will monitor tele for now. Assess echo. Will follow, does not need ICU  I have personally obtained a history, examined the patient, evaluated laboratory and imaging results,  formulated the assessment and plan and placed orders.  Mcarthur Rossetti. Tyson Alias, MD, FACP Pgr: [phone] [facility]  Pulmonary and Critical Care Medicine [facility] Pager: [phone]  07/08/2012, 9:00 AM

## 2012-07-08 NOTE — Progress Notes (Signed)
ANTIBIOTIC CONSULT NOTE - INITIAL  Pharmacy Consult for vancomycin, Zosyn Indication: Empiric  Allergies  Allergen Reactions  . Allopurinol     REACTION: Skin rash  . Azithromycin     Unknown  . Enalapril Maleate     REACTION: swelling of mouth \\T \ throat  . Morphine And Related Other (See Comments)    Possible hallucinations ( 07/12) skin crawling off of body  . Nsaids     Ulcer on EGD 01/01/10 - stopped celebrex  . Valacyclovir Hcl     Unknown    Patient Measurements: Height: 5\' 4"  (162.6 cm) Weight: 117 lb 11.6 oz (53.4 kg) IBW/kg (Calculated) : 54.7    Vital Signs: Temp: 97.8 F (36.6 C) (02/07 0436) Temp src: Oral (02/07 0436) BP: 93/48 mmHg (02/07 0436) Pulse Rate: 77  (02/07 0436) Intake/Output from previous day: 02/06 0701 - 02/07 0700 In: 2700 [I.V.:2700] Out: 300 [Urine:300] Intake/Output from this shift: Total I/O In: 600 [I.V.:600] Out: 300 [Urine:300]  Labs:  [facility] 07/08/12 0459 07/07/12 1441  WBC 19.4* 11.1*  HGB 11.3* 11.5*  PLT 245 252  LABCREA -- --  CREATININE -- 1.46*   Estimated Creatinine Clearance: 25 ml/min (by C-G formula based on Cr of 1.46). No results found for this basename: VANCOTROUGH:2,VANCOPEAK:2,VANCORANDOM:2,GENTTROUGH:2,GENTPEAK:2,GENTRANDOM:2,TOBRATROUGH:2,TOBRAPEAK:2,TOBRARND:2,AMIKACINPEAK:2,AMIKACINTROU:2,AMIKACIN:2, in the last 72 hours   Microbiology: Recent Results (from the past 720 hour(s))  MRSA PCR SCREENING     Status: Normal   Collection Time   07/07/12  8:01 PM      Component Value Range Status Comment   MRSA by PCR NEGATIVE  NEGATIVE Final     Medical History: Past Medical History  Diagnosis Date  . Gout   . INSOMNIA   . ALLERGIC RHINITIS   . ASTHMA   . OSTEOARTHRITIS   . Rheumatoid arthritis   . HYPERTENSION   . HYPERLIPIDEMIA   . GERD (gastroesophageal reflux disease)   . Depression   . ALCOHOL ABUSE     hx detox, dry since 2010  . HYPOTHYROIDISM   . URINARY INCONTINENCE      Medications:  Scheduled:    . [COMPLETED] sodium chloride   Intravenous Once  . aspirin  300 mg Rectal Daily  . [COMPLETED] diltiazem  5 mg Intravenous Once  . heparin  5,000 Units Subcutaneous Q8H  . [COMPLETED] piperacillin-tazobactam (ZOSYN)  IV  3.375 g Intravenous Once  . [COMPLETED] sodium chloride  1,000 mL Intravenous Once  . sodium chloride  3 mL Intravenous Q12H  . vancomycin  1,000 mg Intravenous Once   Assessment: 79 yo female presented with AMS. Pharmacy to manage vancomycin, Zosyn empirically.   Goal of Therapy:  Vancomycin trough level 15-20 mcg/ml  Plan:  1. Zosyn 3.375gm IV Q8H (4 hr infusion) 2. Vancomycin 1gm IV x 1, then 500mg  IV Q24H.   Connie Fitzpatrick 07/08/2012,5:45 AM

## 2012-07-08 NOTE — Progress Notes (Signed)
Bilateral:  No evidence of DVT, superficial thrombosis, or Baker's Cyst.   

## 2012-07-08 NOTE — Progress Notes (Signed)
Pt on 15mg /hr cardizem gtt, has received 5mg  iv cardizem bolus x 2.  Also has received 0.5mg  digoxin.  HR still 132-134,  BP 106/62.  Pt asymptomatic.  Connie Fitzpatrick at bedside and aware.  New orders received.  Will continue to monitor.  Roselie Awkward, RN

## 2012-07-08 NOTE — Progress Notes (Signed)
Family Medicine Teaching [facility] H&P Service Pager: [phone]  Patient name: Connie Fitzpatrick Medical record number: [id number] Date of birth: 06/10/1927 Age: 79 y.o. Gender: female  Primary Care Provider: BOOTH, Denny Peon, MD Consultants: None  CODE STATUS: Limited Code: DNI, No Compression.  Wants Fluids and pressors if indicated  Subjective  Called to pt room due to development of A. Fib with RVR. Pt awakens and reports no pain, no dyspnea "but I can't really tell"   OBJECTIVE  Vitals: Temp:  [97.7 F (36.5 C)-98.8 F (37.1 C)] 97.8 F (36.6 C) (02/07 0436) Pulse Rate:  [71-99] 77  (02/07 0436) Resp:  [14-22] 14  (02/07 0436) BP: (76-105)/(43-67) 93/48 mmHg (02/07 0436) SpO2:  [83 %-98 %] 98 % (02/07 0436) Weight:  [117 lb 11.6 oz (53.4 kg)] 117 lb 11.6 oz (53.4 kg) (02/06 1945)  Weight: Wt Readings from Last 3 Encounters:  07/07/12 117 lb 11.6 oz (53.4 kg)  05/02/12 114 lb 12.8 oz (52.073 kg)  11/04/11 117 lb 11.2 oz (53.388 kg)    I&Os: Yesterday: 02/06 0701 - 02/07 0700 In: 2700 [I.V.:2700] Out: 300 [Urine:300] This shift: Total I/O In: 600 [I.V.:600] Out: 300 [Urine:300]   PE: GENERAL:  Elderly caucasian female. In no discomfort; no respiratory distress.  Does not interactive in meaningful way PSYCH:   Only minimally verbal and is able to say she is not in pain. H&N: AT/[place], trachea midline EENT:  MMM, no scleral icterus, EOMi HEART: RRR, S1/S2 heard, no murmur LUNGS: Crackles in Bilateral fields EXTREMITIES:  warm well perfused, 1+ edema, bilateral DP and PT pulses 2/4.  LUE with deformity NEURO: Moves all 4 extremities spontaneously and symmetrically.  No lateralization.  Patient is unable to follow other instructions for formal neurologic exam,    LABS: CBC BMET   Lab 07/07/12 1441  WBC 11.1*  HGB 11.5*  HCT 35.4*  PLT 252    Lab 07/07/12 1441  NA 140  K 3.8  CL 104  CO2 25  BUN 43*  CREATININE 1.46*  GLUCOSE 104*  CALCIUM 8.8     Currently Pending Labs:  Repeat Lactate  Repeat Troponins  TSH,  A1c, Lipid    07/07/2012 15:12 07/07/2012 17:55  Lactic Acid, Venous 3.46 (H) 3.53 (H)     07/07/2012 16:40  AMPHETAMINES POSITIVE (A)  Barbiturates NONE DETECTED  Benzodiazepines POSITIVE (A)  Opiates NONE DETECTED  COCAINE NONE DETECTED  Tetrahydrocannabinol NONE DETECTED   URINE STUDIES: Results for MISHEL, SANS (MRN [id number]) as of 07/08/2012 03:38  07/07/2012 16:40  Color, Urine AMBER (A)  APPearance CLOUDY (A)  Specific Gravity, Urine 1.028  pH 5.0  Glucose NEGATIVE  Bilirubin Urine SMALL (A)  Ketones, ur 15 (A)  Protein 30 (A)  Urobilinogen, UA 0.2  Nitrite NEGATIVE  Leukocytes, UA SMALL (A)  Hgb urine dipstick NEGATIVE  Urine-Other MANY YEAST  WBC, UA 3-6  RBC / HPF 0-2  Squamous Epithelial / LPF RARE  Bacteria, UA RARE    MICRO: 07/07/12 - Blood Culture X 2 >>>  IMAGING: Dg Chest 2 View  07/07/2012  *RADIOLOGY REPORT*  Clinical Data: Altered mental status.  CHEST - 2 VIEW  Comparison: Portable chest 10/27/2011.  PA and lateral chest 08/16/2010.  Findings: There is cardiomegaly and vascular congestion.  Bibasilar airspace disease appears worse on the left.  There are likely small bilateral pleural effusions.  No pneumothorax is identified.  IMPRESSION:  1.  Cardiomegaly and vascular congestion. 2.  Small bilateral pleural effusions.  Bibasilar  airspace disease, worse on the left, as likely due to atelectasis.   Original Report Authenticated By: Holley Dexter, M.D.    Ct Head Wo Contrast  07/07/2012  *RADIOLOGY REPORT*  Clinical Data: In point pupils.  Altered mental status.  CT HEAD WITHOUT CONTRAST  Technique:  Contiguous axial images were obtained from the base of the skull through the vertex without contrast.  Comparison: CT head without contrast 05/25/2012.  Findings: Extensive periventricular subcortical white matter hypoattenuation is similar to the prior exam.  Remote lacunar infarcts are present in  the basal ganglia bilaterally.  The ventricles are proportionate to the degree of atrophy.  No significant extra-axial fluid collection is present.  A fluid level is present in the left sphenoid sinus.  The paranasal sinuses and mastoid air cells are clear.  The osseous skull is intact.  Atherosclerotic calcifications are present in the cavernous carotid arteries bilaterally.  IMPRESSION:  1.  No acute intracranial abnormality or significant interval change. 2.  Atrophy and extensive white matter disease.  This likely reflects the sequelae of chronic microvascular ischemia. 3.  Persistent fluid in the left sphenoid sinus.   Original Report Authenticated By: Marin Roberts, M.D.     Medications:       . aspirin  300 mg Rectal Daily  . diltiazem  5 mg Intravenous Once  . heparin  5,000 Units Subcutaneous Q8H  . sodium chloride  3 mL Intravenous Q12H  . vancomycin  1,000 mg Intravenous Once    Assessment & Plan  LOS: [age] y.o. year old female with Altered mental status, pinpoint pupils, hypotension, slight tachypnea who had an acute change from her prior level of functioning at [facility].    # Altered Mental Status (Delirium on Dementia) unclear etiology for mental status changes.  UDS did show positive for amphetamines and pinpoint pupils consistent with this,  however hypotension and lack of tachycardia are not convincing for amphetamine toxicity.  CT the head was reassuring for no acute intracranial bleed andthere no focal neurologic deficits on exam she does move all 4 extremities spontaneously.  Unfortunately differential is extremely broad in this patient regarding etiology for her delirium and this is compounded by her underlying dementia.  See the below  sections for further workup  Pt is on Namenda & Aricept at baseline.  Held at this time   # A Flutter (2:1) with RVR: Pt converted ~430.  Remains hemodynamically appropriate but RVR  Discussed with Dr. Charm Barges with Cardiology who  recommended starting low dose Cardizem drip and they will see her this morning  Concern for etiology of A.Flutter - see below  2D ECHO   # SIRS and hypotension: No SIRS criteria on Admission; no evidence of infection.  Now Tachycardic, Leukocytosis = SIRS Criteria.  No sepsis as no source. Improved with fluid bolus.  Now appears clinically euvoluemicto slightly volume overloaded.    IV fluids.  Reduced to 1ml/hr   Will trend lactate  Check D. Dimer - PENDING.    LE Venous Dopplers - Consider starting heparin therapy prior to diagnosis.  given her sedentary lifestyle she is at risk for PE.  # sepsis workup: blood cultures are pending given hypotension.  Does not meet criteria for SEPSIS but is being covered with empiric antibiotics; low threshold to involved CCM if central access and pressors are needed  VANCO & ZOSYN per pharm  # chest pain: resolved at this time, but now consider low output failure.   #  acute renal injury: baseline creatinine of less than 1.  Has increased to 1.46.  We'll see if she responds IV fluids.  Recheck in the morning.  If d-dimer is positive and creatinine is improving will consider CTA to evaluate for PE   BUN to creatinine ratio greater than 30 indicating prerenal should respond to fluids  # Hx of GERD, Hypothyroid, HTN, HLD:   PPI  Hold other home meds until improving and can under go swallow study.  If missed >3 day so synthroid change to IV.  # Rheumatoid Arthritis: continue to monitor  # Positive Amphetamines in UDS: unclear etiology.  Denies cold medication use   --- FEN  *NS @ 50 ml/hr -NPO until cognitively improved --- PPx: heparin & PPI    Disposition  Patient in the SDU.  New A. Fib with RVR receiving Cardizem Bolus.  Cards to see.  Pending labs still with lactate, d-Dimer and Cardiac Enzymes.     Andrena Mews, DO [facility] Resident - PGY-1 07/08/2012 5:26 AM

## 2012-07-08 NOTE — Progress Notes (Signed)
Clinical Social Work Department CLINICAL SOCIAL WORK PLACEMENT NOTE 07/08/2012  Patient:  Connie Fitzpatrick, Connie Fitzpatrick  Account Number:  [id number] Admit date:  07/07/2012  Clinical Social Worker:  Theresia Bough, Theresia Majors  Date/time:  07/08/2012 03:07 PM  Clinical Social Work is seeking post-discharge placement for this patient at the following level of care:   SKILLED NURSING   (*CSW will update this form in Epic as items are completed)   07/08/2012  Patient/family provided with [facility] Department of Clinical Social Work's list of facilities offering this level of care within the geographic area requested by the patient (or if unable, by the patient's family).  07/08/2012  Patient/family informed of their freedom to choose among providers that offer the needed level of care, that participate in Medicare, Medicaid or managed care program needed by the patient, have an available bed and are willing to accept the patient.  07/08/2012  Patient/family informed of [facility]' ownership interest in [facility], as well as of the fact that they are under no obligation to receive care at this facility.  PASARR submitted to EDS on  PASARR number received from EDS on   FL2 transmitted to all facilities in geographic area requested by pt/family on  07/08/2012 FL2 transmitted to all facilities within larger geographic area on   Patient informed that his/her managed care company has contracts with or will negotiate with  certain facilities, including the following:     Patient/family informed of bed offers received:   Patient chooses bed at  Physician recommends and patient chooses bed at    Patient to be transferred to  on   Patient to be transferred to facility by   The following physician request were entered in Epic:   Additional Comments:  Theresia Bough, MSW, [employer] [phone]

## 2012-07-08 NOTE — Progress Notes (Signed)
Utilization review completed.  

## 2012-07-08 NOTE — Progress Notes (Signed)
Family Medicine Teaching [facility] H&P Service Pager: [phone]  Patient name: Connie Fitzpatrick Medical record number: [id number] Date of birth: 1927/07/29 Age: 79 y.o. Gender: female  Primary Care Provider: BOOTH, Denny Peon, MD Consultants: None  CODE STATUS: Limited Code: DNI, No Compression.  Wants Fluids and pressors if indicated  CC  Altered mental Status  HPI  Connie Fitzpatrick is a 79 y.o. year old female presenting from [facility] nursing home following been found a decreased level of consciousness, hypotensive and had shortness of breath with chest pain prior to this event.  She had a new nonproductive cough since 0400 the night prior to arrival.  The patient was seen by Dr. Dessa Phi in the emergency department as well as the emergency room physician.  In the emergency room she was found to be hypotensive,hypoxic to 83, tachypneic to 22 and extremely somnolent and difficult to arouse.  Septic workup was initiated and she was given IV fluids and started on antibiotics.  Her EKG was non revealing.  She did have pinpoint pupils equally with a GCS score of 11.  The patient was able to maintain her blood pressures a nap of greater than 65 following the fluid bolus and she was arousable with the improvement in her blood pressure.  ROS  Level 5 Caveat Applies   HISTORY  PMHx:  Past Medical History  Diagnosis Date  . Gout   . INSOMNIA   . ALLERGIC RHINITIS   . ASTHMA   . OSTEOARTHRITIS   . Rheumatoid arthritis   . HYPERTENSION   . HYPERLIPIDEMIA   . GERD (gastroesophageal reflux disease)   . Depression   . ALCOHOL ABUSE     hx detox, dry since 2010  . HYPOTHYROIDISM   . URINARY INCONTINENCE     PSHx: Past Surgical History  Procedure Date  . Abdominal hysterectomy   . Breast surgery   . Total knee arthroplasty   . Vaginal prolapse repair   . Total knee arthroplasty 07/2002    right  . Total knee arthroplasty 09/1998    Left  . Cataract extraction 2001  . L-spine 1998   . Colpocleisis vaginal [place] 06/2010  . Laparotomy 10/26/2011    Procedure: EXPLORATORY LAPAROTOMY;  Surgeon: Wilmon Arms. Corliss Skains, MD;  Location: [facility] OR;  Service: General;  Laterality: N/A;  . Bowel resection 10/26/2011    Procedure: SMALL BOWEL RESECTION;  Surgeon: Wilmon Arms. Corliss Skains, MD;  Location: [facility] OR;  Service: General;  Laterality: N/A;    Social Hx: History   Social History  . Marital Status: Married    Spouse Name: N/A    Number of Children: N/A  . Years of Education: N/A   Social History Main Topics  . Smoking status: Never Smoker   . Smokeless tobacco: None     Comment: lives with spouse, private home aide asstance  . Alcohol Use: No     Comment: HX alcohol abuse  . Drug Use: No  . Sexually Active: Not Currently   Other Topics Concern  . None   Social History Narrative  . None    Family Hx: Family History  Problem Relation Age of Onset  . Arthritis Mother   . Diabetes Mother   . Hypertension Mother   . Lung cancer Father   . Stroke Father     Allergies: Allergies  Allergen Reactions  . Allopurinol     REACTION: Skin rash  . Azithromycin     Unknown  . Enalapril Maleate  REACTION: swelling of mouth \\T \ throat  . Morphine And Related Other (See Comments)    Possible hallucinations ( 07/12) skin crawling off of body  . Nsaids     Ulcer on EGD 01/01/10 - stopped celebrex  . Valacyclovir Hcl     Unknown    Home Medications: Prescriptions prior to admission  Medication Sig Dispense Refill  . acetaminophen (TYLENOL) 500 MG tablet Take 500 mg by mouth every 6 (six) hours as needed. For pain/fever      . albuterol (PROVENTIL HFA;VENTOLIN HFA) 108 (90 BASE) MCG/ACT inhaler Inhale 2 puffs into the lungs every 4 (four) hours as needed. For shortness of breath      . ALPRAZolam (XANAX) 0.5 MG tablet Take 0.5-1 mg by mouth 2 (two) times daily. Take 0.5 mg in the morning and 1 mg in the evening      . Alum & Mag Hydroxide-Simeth (MAGIC MOUTHWASH) SOLN Take 5  mLs by mouth at bedtime.      Marland Kitchen azelastine (ASTELIN) 137 MCG/SPRAY nasal spray Place 2 sprays into the nose 2 (two) times daily. Use in each nostril as directed  30 mL  2  . bisacodyl (DULCOLAX) 5 MG EC tablet Take 5 mg by mouth at bedtime.      . Calcium-Vitamin D (CALTRATE 600 PLUS-VIT D PO) Take 1 tablet by mouth every evening.      . colchicine 0.6 MG tablet Take 0.6 mg by mouth daily with breakfast.      . donepezil (ARICEPT) 10 MG tablet Take 10 mg by mouth daily with breakfast.      . DULoxetine (CYMBALTA) 60 MG capsule Take 60 mg by mouth at bedtime.      . famotidine (PEPCID) 20 MG tablet Take 1 tablet (20 mg total) by mouth at bedtime.      . febuxostat (ULORIC) 40 MG tablet Take 40 mg by mouth daily with breakfast.      . furosemide (LASIX) 40 MG tablet Take 20 mg by mouth daily.      Marland Kitchen gabapentin (NEURONTIN) 300 MG capsule Take 300 mg by mouth 2 (two) times daily.      Marland Kitchen guaiFENesin (ROBITUSSIN) 100 MG/5ML liquid Take 100 mg by mouth 3 (three) times daily as needed. For congestion      . Horse Chestnut (VENASTAT) 300 MG CPCR Take 300 mg by mouth every evening.      Marland Kitchen ipratropium-albuterol (DUONEB) 0.5-2.5 (3) MG/3ML SOLN Take 3 mLs by nebulization every 6 (six) hours as needed. For shortness of breath      . levothyroxine (SYNTHROID, LEVOTHROID) 100 MCG tablet Take 100 mcg by mouth daily with breakfast.      . loratadine (CLARITIN) 10 MG tablet Take 10 mg by mouth daily.      . magnesium hydroxide (MILK OF MAGNESIA) 400 MG/5ML suspension Take 30 mLs by mouth daily as needed. For constipation      . Melatonin 5 MG CAPS Take 5 mg by mouth at bedtime.       . memantine (NAMENDA) 10 MG tablet Take 1 tablet (10 mg total) by mouth daily.      . metoprolol succinate (TOPROL-XL) 100 MG 24 hr tablet Take 100 mg by mouth at bedtime. Take with or immediately following a meal.      . Multiple Vitamin (MULITIVITAMIN WITH MINERALS) TABS Take 1 tablet by mouth at bedtime.      . multivitamin-lutein  (OCUVITE-LUTEIN) CAPS Take 1 capsule by mouth daily with  breakfast.      . potassium chloride (K-DUR) 10 MEQ tablet Take 10 mEq by mouth daily with breakfast.       . predniSONE (DELTASONE) 5 MG tablet Take 5 mg by mouth daily.      . theophylline (THEODUR) 100 MG 12 hr tablet Take 1 tablet (100 mg total) by mouth 2 (two) times daily.      . traMADol (ULTRAM) 50 MG tablet Take 50 mg by mouth every 8 (eight) hours as needed. For pain  30 tablet  0  . Vitamin D, Ergocalciferol, (DRISDOL) 50000 UNITS CAPS Take 50,000 Units by mouth every 30 (thirty) days.      . vitamin E 400 UNIT capsule Take 400 Units by mouth every evening.         OBJECTIVE  Vitals: Temp:  [97.7 F (36.5 C)-98.8 F (37.1 C)] 97.7 F (36.5 C) (02/06 1945) Pulse Rate:  [74-99] 74  (02/06 1945) Resp:  [15-22] 15  (02/06 1945) BP: (76-105)/(43-67) 95/50 mmHg (02/06 1945) SpO2:  [83 %-96 %] 94 % (02/06 1945) Weight:  [117 lb 11.6 oz (53.4 kg)] 117 lb 11.6 oz (53.4 kg) (02/06 1945)  Weight: Wt Readings from Last 3 Encounters:  07/07/12 117 lb 11.6 oz (53.4 kg)  05/02/12 114 lb 12.8 oz (52.073 kg)  11/04/11 117 lb 11.2 oz (53.388 kg)    I&Os: Yesterday: 02/06 0701 - 02/07 0700 In: 2000 [I.V.:2000] Out: 50 [Urine:50] This shift: Total I/O In: -  Out: 50 [Urine:50]   PE: GENERAL:  Elderly caucasian female. In no discomfort; no respiratory distress.  Does not interactive in meaningful way PSYCH:   Only minimally verbal and is able to say she is not in pain. H&N: AT/[place], trachea midline EENT:  MMM, no scleral icterus, EOMi HEART: RRR, S1/S2 heard, no murmur LUNGS: CTA B, no wheezes, no crackles EXTREMITIES:  warm well perfused, trace edema, bilateral DP and PT pulses 2/4.   NEURO: Moves all 4 extremities spontaneously and symmetrically.  No lateralization.  Patient is unable to follow other instructions for formal neurologic exam,    LABS: CBC BMET   Lab 07/07/12 1441  WBC 11.1*  HGB 11.5*  HCT 35.4*   PLT 252    Lab 07/07/12 1441  NA 140  K 3.8  CL 104  CO2 25  BUN 43*  CREATININE 1.46*  GLUCOSE 104*  CALCIUM 8.8      07/07/2012 15:12 07/07/2012 17:55  Lactic Acid, Venous 3.46 (H) 3.53 (H)     07/07/2012 16:40  AMPHETAMINES POSITIVE (A)  Barbiturates NONE DETECTED  Benzodiazepines POSITIVE (A)  Opiates NONE DETECTED  COCAINE NONE DETECTED  Tetrahydrocannabinol NONE DETECTED   URINE STUDIES: Results for ELIAS, BORDNER (MRN [id number]) as of 07/08/2012 03:38  07/07/2012 16:40  Color, Urine AMBER (A)  APPearance CLOUDY (A)  Specific Gravity, Urine 1.028  pH 5.0  Glucose NEGATIVE  Bilirubin Urine SMALL (A)  Ketones, ur 15 (A)  Protein 30 (A)  Urobilinogen, UA 0.2  Nitrite NEGATIVE  Leukocytes, UA SMALL (A)  Hgb urine dipstick NEGATIVE  Urine-Other MANY YEAST  WBC, UA 3-6  RBC / HPF 0-2  Squamous Epithelial / LPF RARE  Bacteria, UA RARE    MICRO: 07/07/12 - Blood Culture X 2 >>>  IMAGING: Dg Chest 2 View  07/07/2012  *RADIOLOGY REPORT*  Clinical Data: Altered mental status.  CHEST - 2 VIEW  Comparison: Portable chest 10/27/2011.  PA and lateral chest 08/16/2010.  Findings: There is cardiomegaly and  vascular congestion.  Bibasilar airspace disease appears worse on the left.  There are likely small bilateral pleural effusions.  No pneumothorax is identified.  IMPRESSION:  1.  Cardiomegaly and vascular congestion. 2.  Small bilateral pleural effusions.  Bibasilar airspace disease, worse on the left, as likely due to atelectasis.   Original Report Authenticated By: Holley Dexter, M.D.    Ct Head Wo Contrast  07/07/2012  *RADIOLOGY REPORT*  Clinical Data: In point pupils.  Altered mental status.  CT HEAD WITHOUT CONTRAST  Technique:  Contiguous axial images were obtained from the base of the skull through the vertex without contrast.  Comparison: CT head without contrast 05/25/2012.  Findings: Extensive periventricular subcortical white matter hypoattenuation is similar to the  prior exam.  Remote lacunar infarcts are present in the basal ganglia bilaterally.  The ventricles are proportionate to the degree of atrophy.  No significant extra-axial fluid collection is present.  A fluid level is present in the left sphenoid sinus.  The paranasal sinuses and mastoid air cells are clear.  The osseous skull is intact.  Atherosclerotic calcifications are present in the cavernous carotid arteries bilaterally.  IMPRESSION:  1.  No acute intracranial abnormality or significant interval change. 2.  Atrophy and extensive white matter disease.  This likely reflects the sequelae of chronic microvascular ischemia. 3.  Persistent fluid in the left sphenoid sinus.   Original Report Authenticated By: Marin Roberts, M.D.     Medications:      . aspirin  300 mg Rectal Daily  . heparin  5,000 Units Subcutaneous Q8H  . sodium chloride  3 mL Intravenous Q12H  . vancomycin  1,000 mg Intravenous Once    Assessment & Plan  LOS: 14 79 y.o. year old female with Altered mental status, pinpoint pupils, hypotension, slight tachypnea who had an acute change from her prior level of functioning at [facility].    # Altered Mental Status (Delirium on Dementia) unclear etiology for mental status changes.  UDS did show positive for amphetamines and pinpoint pupils consistent with this,  however hypotension and lack of tachycardia are not convincing for amphetamine toxicity.  CT the head was reassuring for no acute intracranial bleed andthere no focal neurologic deficits on exam she does move all 4 extremities spontaneously.  Unfortunately differential is extremely broad in this patient regarding etiology for her delirium and this is compounded by her underlying dementia.  See the below  sections for further workup  Pt is on Namenda & Aricept at baseline.  Held at this time   # hypotension: She does not meet SIRS criteria and does not have any indications for A. potentialsource of infection.this was  also acute onset.  She is responded to her fluid bolus and we will continues running IV fluids however weighing out over resuscitating this 79 year old female with known  hypertension and hyperlipidemia likely some underlying cardiac dysfunction  IV fluids.  Start at 100 mL per hour following aggressive fluid resuscitation in the emergency department.  Watch for signs and symptoms of fluid overload  Will trend lactate  Check D. Dimer to consider potential for PE.  Low threshold for pursuing to VQ scan given renal insufficiency.  She is not have unilateral lower extremity swelling or other signs of DVT however given her sedentary lifestyle she is at risk for PE.  # sepsis workup: blood cultures are pending given hypotension.  Does not meet criteria for SEPSIS but is being covered with empiric and antibiotics; low threshold  to involved CCM if central access and pressors are needed  # chest pain: we'll cycle her cardiac enzymes to evaluate for acute coronary syndrome.  Her EKG is reassuring.  Repeat EKG in the morning.  Consider low output failure.  Consider 2-D echo  # acute renal injury: baseline creatinine of less than 1.  Has increased to 1.46.  We'll see if she responds IV fluids.  Recheck in the morning.  If d-dimer is positive and creatinine is improving will consider CTA to evaluate for PE   BUN to creatinine ratio greater than 30 indicating prerenal should respond to fluids  # Hx of GERD, Hypothyroid, HTN, HLD:   PPI  Hold other home meds until improving and can under go swallow study.  If missed >3 day so synthroid change to IV.  # Positive Amphetamines in UDS: unclear etiology.  Denies cold medication use   --- FEN  *NS @ 100 ml/hr -NPO --- PPx: heparin & PPI    Disposition  Patient to be placed and step down unit.  She has responded nicely to normal saline boluses and fluids in the emergency department.  We will continue to monitor for worsening hypotension or any signs of sepsis  and we have a low threshold to involve CCM if she requires pressors.  This is a very frail individual and not due well with aggressive therapy including chest compressions and intubation and agree with her choice.  I've updated the family at bedside prior to admission.   Andrena Mews, DO [facility] Resident - PGY-1 07/08/2012 12:17 AM
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
## 2012-07-08 NOTE — Progress Notes (Signed)
INITIAL NUTRITION ASSESSMENT  DOCUMENTATION CODES Per approved criteria  -Not Applicable   INTERVENTION:  Recommend advance diet to Regular with Ensure Complete PO BID to maximize oral intake.  NUTRITION DIAGNOSIS: Inadequate oral intake related to inability to eat as evidenced by NPO status.   Goal: Intake to meet >90% of estimated nutrition needs.  Monitor:  Diet advancement, PO intake, labs, weight trend.  Reason for Assessment: MST=3  79 y.o. female  Admitting Dx: Hypotension, decreased LOC   ASSESSMENT: Patient presented to the ED on 2/6 with hypotension, pinpoint pupils, and decreased LOC.  Patient reports that she has been eating poorly for the past few months.  Was drinking Ensure at [place], but has not received it in the past 6 months.  Wants something to eat.  Patient reports 3 lb weight loss in the past 6 months, not a significant amount of weight loss.  Nutrition Focused Physical Exam:  Subcutaneous Fat:  Orbital Region: WNL Upper Arm Region: WNL Thoracic and Lumbar Region: WNL  Muscle:  Temple Region: WNL Clavicle Bone Region: WNL Clavicle and Acromion Bone Region: WNL Scapular Bone Region: WNL Dorsal Hand: WNL Patellar Region: WNL Anterior Thigh Region: WNL Posterior Calf Region: WNL  Edema: bilateral LE edema     Height: Ht Readings from Last 1 Encounters:  07/07/12 5\' 4"  (1.626 m)    Weight: Wt Readings from Last 1 Encounters:  07/07/12 117 lb 11.6 oz (53.4 kg)    Ideal Body Weight: 54.5 kg  % Ideal Body Weight: 98%  Wt Readings from Last 10 Encounters:  07/07/12 117 lb 11.6 oz (53.4 kg)  05/02/12 114 lb 12.8 oz (52.073 kg)  11/04/11 117 lb 11.2 oz (53.388 kg)  11/04/11 117 lb 11.2 oz (53.388 kg)  07/03/11 113 lb 1.9 oz (51.311 kg)  05/25/11 111 lb 0.6 oz (50.367 kg)  02/25/11 115 lb 6.4 oz (52.345 kg)  01/12/11 114 lb 12.8 oz (52.073 kg)  11/03/10 124 lb 8 oz (56.473 kg)  08/21/10 123 lb 8 oz (56.019 kg)    Usual Body  Weight: 117 lb  % Usual Body Weight: 100%  BMI:  Body mass index is 20.21 kg/(m^2).  Estimated Nutritional Needs: Kcal: 1350-1550 Protein: 70-85 gm Fluid: 1.4-1.6 L  Skin: skin tear right lower leg  Diet Order: NPO  EDUCATION NEEDS: -Education not appropriate at this time   Intake/Output Summary (Last 24 hours) at 07/08/12 1141 Last data filed at 07/08/12 0820  Gross per 24 hour  Intake 3449.17 ml  Output    500 ml  Net 2949.17 ml    Labs:   Lab 07/08/12 1012 07/08/12 0459 07/07/12 1441  NA 143 145 140  K 3.9 4.2 3.8  CL 110 111 104  CO2 25 24 25   BUN 34* 38* 43*  CREATININE 1.12* 1.17* 1.46*  CALCIUM 8.7 8.6 8.8  MG -- -- --  PHOS -- -- --  GLUCOSE 91 74 104*    CBG (last 3)  No results found for this basename: GLUCAP:3 in the last 72 hours  Scheduled Meds:   . aspirin  300 mg Rectal Daily  . diltiazem  30 mg Oral Q6H  . heparin  5,000 Units Subcutaneous Q8H  . hydrocortisone sod succinate (SOLU-CORTEF) injection  50 mg Intravenous Q6H  . piperacillin-tazobactam (ZOSYN)  IV  3.375 g Intravenous Q8H  . sodium chloride  3 mL Intravenous Q12H  . vancomycin  500 mg Intravenous Q24H    Continuous Infusions:   . sodium  chloride 50 mL/hr at 07/08/12 0800  . diltiazem (CARDIZEM) infusion 5 mg/hr (07/08/12 1138)    Past Medical History  Diagnosis Date  . Gout   . INSOMNIA   . ALLERGIC RHINITIS   . ASTHMA   . OSTEOARTHRITIS   . Rheumatoid arthritis   . HYPERTENSION   . HYPERLIPIDEMIA   . GERD (gastroesophageal reflux disease)   . Depression   . ALCOHOL ABUSE     hx detox, dry since 2010  . HYPOTHYROIDISM   . URINARY INCONTINENCE   . Dementia   . Recurrent falls   . SDH (subdural hematoma)     a. 2008 in setting of fall.  . Chronic pain   . Perforated small intestine     a. From swallowed foreign body - s/p exlap with small bowel resection 09/2011.    Past Surgical History  Procedure Date  . Abdominal hysterectomy   . Breast surgery   .  Total knee arthroplasty   . Vaginal prolapse repair   . Total knee arthroplasty 07/2002    right  . Total knee arthroplasty 09/1998    Left  . Cataract extraction 2001  . L-spine 1998  . Colpocleisis vaginal [place] 06/2010  . Laparotomy 10/26/2011    Procedure: EXPLORATORY LAPAROTOMY;  Surgeon: Wilmon Arms. Corliss Skains, MD;  Location: [facility] OR;  Service: General;  Laterality: N/A;  . Bowel resection 10/26/2011    Procedure: SMALL BOWEL RESECTION;  Surgeon: Wilmon Arms. Corliss Skains, MD;  Location: [facility] OR;  Service: General;  Laterality: N/A;    Joaquin Courts, RD, LDN, CNSC Pager# [phone] After Hours Pager# [phone]

## 2012-07-09 ENCOUNTER — Inpatient Hospital Stay (HOSPITAL_COMMUNITY): Payer: Medicare Other

## 2012-07-09 DIAGNOSIS — E039 Hypothyroidism, unspecified: Secondary | ICD-10-CM

## 2012-07-09 DIAGNOSIS — J45909 Unspecified asthma, uncomplicated: Secondary | ICD-10-CM

## 2012-07-09 DIAGNOSIS — M069 Rheumatoid arthritis, unspecified: Secondary | ICD-10-CM

## 2012-07-09 LAB — CBC
MCH: 30.4 pg (ref 26.0–34.0)
MCHC: 32.5 g/dL (ref 30.0–36.0)
Platelets: 271 10*3/uL (ref 150–400)

## 2012-07-09 LAB — BASIC METABOLIC PANEL
BUN: 24 mg/dL — ABNORMAL HIGH (ref 6–23)
Calcium: 9.1 mg/dL (ref 8.4–10.5)
GFR calc non Af Amer: 62 mL/min — ABNORMAL LOW (ref >90)
Glucose, Bld: 134 mg/dL — ABNORMAL HIGH (ref 70–99)

## 2012-07-09 MED ORDER — DILTIAZEM HCL 30 MG PO TABS
30.0000 mg | ORAL_TABLET | Freq: Three times a day (TID) | ORAL | Status: DC
  Administered 2012-07-09 – 2012-07-12 (×10): 30 mg via ORAL
  Filled 2012-07-09 (×12): qty 1

## 2012-07-09 MED ORDER — ASPIRIN EC 81 MG PO TBEC
81.0000 mg | DELAYED_RELEASE_TABLET | Freq: Every day | ORAL | Status: DC
  Administered 2012-07-09 – 2012-07-12 (×4): 81 mg via ORAL
  Filled 2012-07-09 (×4): qty 1

## 2012-07-09 MED ORDER — PANTOPRAZOLE SODIUM 40 MG PO TBEC
40.0000 mg | DELAYED_RELEASE_TABLET | Freq: Every day | ORAL | Status: DC
  Administered 2012-07-09 – 2012-07-12 (×4): 40 mg via ORAL
  Filled 2012-07-09 (×4): qty 1

## 2012-07-09 MED ORDER — LEVOTHYROXINE SODIUM 100 MCG PO TABS
100.0000 ug | ORAL_TABLET | Freq: Every day | ORAL | Status: DC
  Administered 2012-07-10 – 2012-07-12 (×3): 100 ug via ORAL
  Filled 2012-07-09 (×5): qty 1

## 2012-07-09 NOTE — Progress Notes (Signed)
Patient ID: Connie Fitzpatrick, female   DOB: 1927/10/06, 79 y.o.   MRN: [id number] Patient remarkedly improved this am. Currently in NSR. Echo reveals normal LV systolic function and mild AI. Nothing to add this am. Will follow.

## 2012-07-09 NOTE — Progress Notes (Signed)
Family Medicine Teaching Service Daily Progress Note Service Pager [phone]  Subjective: Patient has no complaints this morning, is not sure if she is hungry, denies pain, is disoriented Objective: Vital signs in last 24 hours: Filed Vitals:   07/08/12 2344 07/09/12 0000 07/09/12 0400 07/09/12 0745  BP: 128/60 127/61 142/65 149/73  Pulse: 75  69 74  Temp: 97.8 F (36.6 C)  97.9 F (36.6 C) 97.7 F (36.5 C)  TempSrc: Oral  Oral Oral  Resp: 15 15 14 15   Height:      Weight:      SpO2: 99%  98% 99%    Intake/Output Summary (Last 24 hours) at 07/09/12 0855 Last data filed at 07/09/12 0400  Gross per 24 hour  Intake 699.75 ml  Output    700 ml  Net  -0.25 ml   General appearance: sleeping, wakes to voice, thinks she is in a nursing home, knows it is February, cannot name president, says it is 42. Eyes: EOMIT Neck: no JVD and supple, symmetrical, trachea midline Lungs: clear to auscultation bilaterally Heart: regular rate and rhythm, S1, S2 normal, no murmur, click, rub or gallop Abdomen: soft, non-tender; bowel sounds normal; no masses,  no organomegaly Extremities: extremities normal, atraumatic, no cyanosis or edema Pulses: 2+ and symmetric Lab Results:  Recent Labs  07/08/12 0459 07/09/12 0440  WBC 19.4* 18.2*  HGB 11.3* 11.5*  HCT 35.8* 35.4*  PLT 245 271    Recent Labs  07/08/12 1012 07/09/12 0440  NA 143 141  K 3.9 3.6  CL 110 108  CO2 25 25  GLUCOSE 91 134*  BUN 34* 24*  CREATININE 1.12* 0.85  CALCIUM 8.7 9.1   Micro Results: Recent Results (from the past 240 hour(s))  CULTURE, BLOOD (ROUTINE X 2)     Status: None   Collection Time    07/07/12  2:41 PM      Result Value Range Status   Specimen Description BLOOD ARM RIGHT   Final   Special Requests BOTTLES DRAWN AEROBIC AND ANAEROBIC 10CC   Final   Culture  Setup Time 07/07/2012 22:02   Final   Culture     Final   Value:        BLOOD CULTURE RECEIVED NO GROWTH TO DATE CULTURE WILL BE HELD  FOR 5 DAYS BEFORE ISSUING A FINAL NEGATIVE REPORT   Report Status PENDING   Incomplete  CULTURE, BLOOD (ROUTINE X 2)     Status: None   Collection Time    07/07/12  2:52 PM      Result Value Range Status   Specimen Description BLOOD RIGHT UPPER WRIST   Final   Special Requests BOTTLES DRAWN AEROBIC AND ANAEROBIC 10CC   Final   Culture  Setup Time 07/07/2012 22:02   Final   Culture     Final   Value:        BLOOD CULTURE RECEIVED NO GROWTH TO DATE CULTURE WILL BE HELD FOR 5 DAYS BEFORE ISSUING A FINAL NEGATIVE REPORT   Report Status PENDING   Incomplete  MRSA PCR SCREENING     Status: None   Collection Time    07/07/12  8:01 PM      Result Value Range Status   MRSA by PCR NEGATIVE  NEGATIVE Final   Comment:            The GeneXpert MRSA Assay (FDA     approved for NASAL specimens     only), is one component  of a     comprehensive MRSA colonization     surveillance program. It is not     intended to diagnose MRSA     infection nor to guide or     monitor treatment for     MRSA infections.   Studies/Results:  Medications: I have reviewed the patient's current medications. Scheduled Meds: . aspirin  300 mg Rectal Daily  . heparin  5,000 Units Subcutaneous Q8H  . hydrocortisone sod succinate (SOLU-CORTEF) injection  50 mg Intravenous Q6H  . piperacillin-tazobactam (ZOSYN)  IV  3.375 g Intravenous Q8H  . sodium chloride  3 mL Intravenous Q12H  . vancomycin  500 mg Intravenous Q24H   Continuous Infusions: . sodium chloride 50 mL/hr at 07/08/12 1800  . diltiazem (CARDIZEM) infusion 5 mg/hr (07/09/12 0721)   PRN Meds:.diltiazem, diltiazem, metoprolol Assessment/Plan: Simar Pothier is a 79 y.o. female patient PMHx of Dementia and HTN, Hospital day 2 for hypotension, altered mental status, concern for Sepsis:   SIRS and Hypotension without source of infection: - patient has improved dramatically with IVF and antibiotics, still unclear source of infection.  - UA unimpressive, but  urine culture pending, will repeat CXR this am to eval for HCAP, blood cultures pending - Continue IVF and Vanc/Zosyn, follow WBC - continue to hold home BP medications and statin  Hypoxemia:  - on 2L [place], does not use oxygen regularly, points towards lung etiology of infection.   A-Flutter with RVR:  - Resolved with diltiazem, hemodynamically stable since yesterday  Altered Mental status:  - likely due to hypotension, this has improved, patient does have dementia and seems close to her baseline  Acute Renal injury:  - was likely pre-renal due to poor perfusion, has normalized with IVF  Hypothyroidism: will re-start home synthroid  FEN/GI: continue NS, but will advance diet as patient is now alert, re-start PPI.   DVT PPx: SQ Heparin  Disposition: Transfer to telemetry, PT/OT consult, wean oxygen as tolerated.     LOS: 2 days   Ame Heagle 07/09/2012, 8:55 AM

## 2012-07-09 NOTE — Evaluation (Signed)
Physical Therapy Evaluation Patient Details Name: Connie Fitzpatrick MRN: [id number] DOB: 01-14-1928 Today's Date: 07/09/2012 Time: 4098-1191 PT Time Calculation (min): 21 min  PT Assessment / Plan / Recommendation Clinical Impression  Pt admitted from [facility] with AMS , hypotension and Afib. Pt with limited mobility at baseline and unable to get facility to answer phone so all PLOF from spouse. Pt can transfer to [facility] on her own if equipment nearby per spouse but has had many falls in last year but none in last month. Spouse's understanding was that pt would receive more therapy at SNF to return to walking which she has been unable to do for 6months and he would like her to increase function. Will follow acutely for trial basis to increase safety and ability with transfers and to attempt to progress mobility with recommendation for SNF for further care.     PT Assessment  Patient needs continued PT services    Follow Up Recommendations  SNF    Does the patient have the potential to tolerate intense rehabilitation      Barriers to Discharge Decreased caregiver support      Equipment Recommendations  None recommended by PT    Recommendations for Other Services     Frequency Min 2X/week (trial)    Precautions / Restrictions Precautions Precautions: Fall   Pertinent Vitals/Pain No pain sats 97% on RA      Mobility  Bed Mobility Bed Mobility: Rolling Left;Left Sidelying to Sit;Sitting - Scoot to [place] of Bed;Sit to Supine;Scooting to [facility] Rolling Left: 4: Min assist;With rail Left Sidelying to Sit: 4: Min assist;With rails;HOB flat Sitting - Scoot to Edge of Bed: 3: Mod assist Sit to Supine: 3: Mod assist Scooting to HOB: 1: +2 Total assist Scooting to [facility]: Patient Percentage: 0% Details for Bed Mobility Assistance: Pt with assist of PT hand and rail to grasp to pull forward with rolling and side to sit with cueing for sequence and assist of pad to scoot to EOB and  HOB Transfers Transfers: Sit to Stand;Stand to Sit;Squat Pivot Transfers Sit to Stand: 4: Min guard;From bed;From chair/3-in-1 Stand to Sit: 4: Min guard;To bed;To chair/3-in-1 Squat Pivot Transfers: 4: Min assist Details for Transfer Assistance: cueing for safety and required equipment setup with assist to guide pelvis with pivot and to prevent fall. Pt maintains extreme trunk flexion with transfer and requires increased time to complete pivot as initially transferring to chair and sitting on armrest rather than fully clearing it Ambulation/Gait Ambulation/Gait Assistance: Not tested (comment)    Exercises     PT Diagnosis: Generalized weakness;Altered mental status  PT Problem List: Decreased activity tolerance;Decreased strength;Decreased mobility;Decreased cognition PT Treatment Interventions: Functional mobility training;Therapeutic activities;Therapeutic exercise;Patient/family education   PT Goals Acute Rehab PT Goals PT Goal Formulation: With patient/family Time For Goal Achievement: 07/16/12 Potential to Achieve Goals: Fair Pt will go Supine/Side to Sit: with rail;with HOB not 0 degrees (comment degree);with supervision PT Goal: Supine/Side to Sit - Progress: Goal set today Pt will go Sit to Supine/Side: with min assist;with HOB 0 degrees;with rail PT Goal: Sit to Supine/Side - Progress: Goal set today Pt will go Sit to Stand: with min assist (minguard) PT Goal: Sit to Stand - Progress: Goal set today Pt will go Stand to Sit: with min assist (minguard) PT Goal: Stand to Sit - Progress: Goal set today Pt will Transfer Bed to Chair/Chair to Bed: with min assist (mnguard) PT Transfer Goal: Bed to Chair/Chair to Bed - Progress: Goal  set today Pt will Stand: with min assist;1 - 2 min;with bilateral upper extremity support PT Goal: Stand - Progress: Goal set today  Visit Information  Last PT Received On: 07/09/12 Assistance Needed: +1    Subjective Data  Subjective: I can  get to my wheelchair by myself Patient Stated Goal: for her to be able to do more on her own- per spouse   Prior Functioning  Home Living Lives With: Other (Comment) Available Help at Discharge: Skilled Nursing Facility Home Access: Level entry Home Layout: One level Bathroom Shower/Tub: Health visitor: Standard Home Adaptive Equipment: Wheelchair - manual Prior Function Level of Independence: Needs assistance Needs Assistance: Bathing;Dressing;Feeding;Meal Prep;Light Housekeeping;Transfers Bath: Moderate Dressing: Moderate Feeding: Minimal Meal Prep: Total Light Housekeeping: Total Transfer Assistance: supervision for transfers OOB to [facility], min assist to return to bed Able to Take Stairs?: No Driving: No Vocation: Retired Comments: Spouse present and providing PLOF and states pt was able to walk until 6 mo ago when multiple hospital admissions decreased her strength and pt has not been getting regular therapy as expected at SNF Communication Communication: No difficulties    Cognition  Cognition Overall Cognitive Status: History of cognitive impairments - at baseline Arousal/Alertness: Awake/alert Orientation Level: Disoriented to;Time;Place;Situation Behavior During Session: [facility] for tasks performed    Extremity/Trunk Assessment Right Upper Extremity Assessment RUE ROM/Strength/Tone: Deficits Left Upper Extremity Assessment LUE ROM/Strength/Tone: Deficits LUE ROM/Strength/Tone Deficits: pt with RA with decreased ROM hands and wrists Right Lower Extremity Assessment RLE ROM/Strength/Tone: Deficits;Unable to fully assess;Due to impaired cognition RLE ROM/Strength/Tone Deficits: grossly 2/5 for hip and knee flexion Left Lower Extremity Assessment LLE ROM/Strength/Tone: Deficits;Due to impaired cognition;Unable to fully assess LLE ROM/Strength/Tone Deficits: grossly 2/5 for hip and knee flexion Trunk Assessment Trunk Assessment: Kyphotic   Balance Static  Sitting Balance Static Sitting - Balance Support: Feet supported Static Sitting - Level of Assistance: 5: Stand by assistance Static Sitting - Comment/# of Minutes: 4  End of Session PT - End of Session Activity Tolerance: Patient tolerated treatment well Patient left: in bed;with call bell/phone within reach;with bed alarm set;with family/visitor present Nurse Communication: Mobility status  GP     Delorse Lek 07/09/2012, 12:27 PM Delaney Meigs, PT [phone]

## 2012-07-09 NOTE — Progress Notes (Signed)
PULMONARY  / CRITICAL CARE MEDICINE  Name: Connie Fitzpatrick MRN: [id number] DOB: August 22, 1927    ADMISSION DATE:  07/07/2012 CONSULTATION DATE:  07/08/2012  REFERRING MD :  Berline Chough   CHIEF COMPLAINT:  Hypotension, decreased LOC   BRIEF PATIENT DESCRIPTION:   79 y/o WF- presented to the ED on 2/6 with hypotension, pinpoint pupils, and decreased LOC.  She was given IV fluids and antibiotics were stated with improvement to her BP and mental status.  On 2/7 she experienced A-Fib with RVR and was placed on a cadizem gtt. This was only short term.  PCCM consulted 2/7 to evaluate patient given possible sepsis.    HISTORY OF PRESENT ILLNESS:  79 y/o WF with history of prior alcoholism, SDH, recurrent falls, asthma, hypothyroidism, dementia, and chronic pain from rheumatoid arthritis who presented to [facility] from [facility] 2/6 with decreased consciousness, hypotension, SOB with chest pain prior to the event, and nonproductive cough.  Presented to the ED with hypotension, pinpoint pupils, and decreased LOC.  She was given IV fluids and antibiotics were stated with improvement to her BP and mental status.  Her lactic acid was elevated and she was positive for amphetamines (unclear reason why) and benzos (on Xanax PTA).  Her UA showed many yeast/small bili/ketones/protein/leuks. On 2/7 she experienced A-Fib with RVR and was placed on a cadizem gtt.  The patient is a very poor historian and unable to provide very much history.   SIGNIFICANT EVENTS / STUDIES:  2/6 Admitted from Nursing home 2/6 CT without acute adnormailty 2/7 A-Fib with RVR  LINES / TUBES: Foley Cath 2/6>>>  CULTURES: MRSA PCS 2/6 - Negative BCx2 2/6>>>  ANTIBIOTICS: Vanc 2/6>>> Zosyn 2/6>>>   SUBJECTIVE:  Looks better   VITAL SIGNS: Temp:  [97.5 F (36.4 C)-97.9 F (36.6 C)] 97.7 F (36.5 C) (02/08 0745) Pulse Rate:  [67-137] 74 (02/08 0745) Resp:  [14-21] 15 (02/08 0745) BP: (101-149)/(50-82) 149/73  mmHg (02/08 0745) SpO2:  [94 %-99 %] 99 % (02/08 0745) 2 liters      INTAKE / OUTPUT: Intake/Output     02/07 0701 - 02/08 0700 02/08 0701 - 02/09 0700   P.O.  120   I.V. (mL/kg) 704.8 (13.2)    IV Piggyback 250    Total Intake(mL/kg) 954.8 (17.9) 120 (2.2)   Urine (mL/kg/hr) 900 (0.7) 1000 (5.5)   Total Output 900 1000   Net +54.8 -880        Stool Occurrence 2 x      PHYSICAL EXAMINATION: General:  Patient appears in good spirits and cooperative Neuro:  Patient confused to why she is here. Oriented to self and place.  Follows commands in all extremities. No focal deficits.   HEENT:  Pupils PEARL, moist MM, no JVD. Cardiovascular:  Sinus rhythm. No murmur, gallops. Lungs: Clear but diminished throughout. Abdomen:  Soft, round, non distended Musculoskeletal:  Bilateral lower extremity edema, Lt +3, Rt +2.  Rt upper hand and wrist contracted Skin:  Intact  LABS:  Recent Labs Lab 07/07/12 1441  07/07/12 1755 07/08/12 0459 07/08/12 0640 07/08/12 1012 07/09/12 0440  HGB 11.5*  --   --  11.3*  --   --  11.5*  WBC 11.1*  --   --  19.4*  --   --  18.2*  PLT 252  --   --  245  --   --  271  NA 140  --   --  145  --  143 141  K 3.8  --   --  4.2  --  3.9 3.6  CL 104  --   --  111  --  110 108  CO2 25  --   --  24  --  25 25  GLUCOSE 104*  --   --  74  --  91 134*  BUN 43*  --   --  38*  --  34* 24*  CREATININE 1.46*  --   --  1.17*  --  1.12* 0.85  CALCIUM 8.8  --   --  8.6  --  8.7 9.1  AST 15  --   --  16  --   --   --   ALT 14  --   --  12  --   --   --   ALKPHOS 65  --   --  65  --   --   --   BILITOT 0.3  --   --  0.3  --   --   --   PROT 5.0*  --   --  5.0*  --   --   --   ALBUMIN 2.6*  --   --  2.4*  --   --   --   LATICACIDVEN  --   < > 3.53* 1.5  --  1.5  --   TROPONINI <0.30  --   --  <0.30  --   --   --   PROCALCITON  --   --   --   --  27.99  --   --   < > = values in this interval not displayed. No results found for this basename: GLUCAP,  in the last  168 hours   CXR: 2/6 - Small bilateral pleural effusions. Bibasilar airspace disease, worse on the left, as likely due to atelectasis   ASSESSMENT / PLAN:  PULMONARY  A:  Bilateral pleural effusions, L>R atx      H/O COPD        R/O HCAP AS SOURCE  P:  F/u CXR => pending      Keep even at this point      pulm hygiene       May be none traditional presentation PNA in elderyl, with atx   CARDIOVASCULAR A: Atrial fibrillation w/ RVR - Patient currently in SR , may have been MAT, clearing lactic acid P:  -d/c cardiazem  - F/u echo -d/c theophylline, assess level   RENAL A:  Acute renal insufficiency - Urine output and SCr improving.   P:   -F/u Chem -Monitor urine output -even  Balance goals -Strict I&O   GASTROINTESTINAL A:  R/o dysphagia P:   -Supportive care Consider slp   HEMATOLOGIC A: Leukocytosis in the setting of sepsis Positive D-Dimer - Patient no showing any clinical signs of PE at this time. P:  -F/u cbc in AM -See ID section - F/u US of lower extremities - F/u Echo   INFECTIOUS A:  Septic Shock - In the setting of unknown source, procalcitonin elevated (28 on 2/7), improving lactic acid.  Patient has improved clinically with IV fluid resuscitation and antibiotics.  R/o HCAP P:   - Cont Empiric IV vanc and zosyn - F/u Cultures - Repeat UA, first not impressive   ENDOCRINE A:   Hypothyroidism Presumed adrenal insuff (chronic pred for RA) DM  P:   chk tsh Agree w/ stress dose roids ssi    NEUROLOGIC A:  H/o dementia P:   Supportive care Avoid benzo   TODAY'S SUMMARY:  79 year old w/ severe sepsis and volume responsive hypotension. Source unclear, but has responded to current rx. LIkely HCAP as source without overt distress, Would get LE dopplers, cont IVFs. Cont abx, repeat U/A and follow cultures. RE: AF w/ RVR, now off cardiazem. Would d/c theophyline. Will monitor tele for now. Assess echo. Will follow, does not need  ICU   Wylodean Shimmel M. Kriste Basque, MD Pulmonary and Critical Care Medicine [facility] Pager: [phone]  07/09/2012, 10:24 AM

## 2012-07-09 NOTE — Progress Notes (Signed)
Seen and examined.  Discussed with Dr. Lula Olszewski.  Agree with her management.   Repeat CXR no pneumonia.  Still unclear the source of her acute decompensation.  We are treating for infection but have no source.  At some point, we will need to bite the bullet and DC antibiotics.

## 2012-07-09 NOTE — Progress Notes (Signed)
2.8.14.NSG Pt. To 67oo per bed accompanied by NT alert to self ; pleasant with ongoing piv to rt arm dry and intact; oriented to unit set up; husband at bedside; fragile ski; noted hematomas in arms and legs

## 2012-07-09 NOTE — Progress Notes (Signed)
Patient being transferred to 6700 per MD order. Rport called to unit RN, will transfer in bed. Patient husband at bedside.

## 2012-07-10 DIAGNOSIS — I959 Hypotension, unspecified: Principal | ICD-10-CM

## 2012-07-10 DIAGNOSIS — R269 Unspecified abnormalities of gait and mobility: Secondary | ICD-10-CM

## 2012-07-10 LAB — BASIC METABOLIC PANEL
Calcium: 9.6 mg/dL (ref 8.4–10.5)
Creatinine, Ser: 0.72 mg/dL (ref 0.50–1.10)
GFR calc Af Amer: >90 mL/min (ref >90)

## 2012-07-10 LAB — CBC
MCV: 91.6 fL (ref 78.0–100.0)
Platelets: 345 10*3/uL (ref 150–400)
RDW: 15.1 % (ref 11.5–15.5)
WBC: 19.5 10*3/uL — ABNORMAL HIGH (ref 4.0–10.5)

## 2012-07-10 LAB — URINE CULTURE: Colony Count: 85000

## 2012-07-10 LAB — PROCALCITONIN: Procalcitonin: 6.02 ng/mL

## 2012-07-10 MED ORDER — ALPRAZOLAM 0.5 MG PO TABS
0.5000 mg | ORAL_TABLET | Freq: Two times a day (BID) | ORAL | Status: DC
  Administered 2012-07-10 – 2012-07-12 (×5): 0.5 mg via ORAL
  Filled 2012-07-10 (×5): qty 1

## 2012-07-10 MED ORDER — ACETAMINOPHEN 325 MG PO TABS
650.0000 mg | ORAL_TABLET | Freq: Three times a day (TID) | ORAL | Status: DC | PRN
  Administered 2012-07-10 – 2012-07-12 (×2): 650 mg via ORAL
  Filled 2012-07-10 (×2): qty 2

## 2012-07-10 MED ORDER — FUROSEMIDE 20 MG PO TABS
20.0000 mg | ORAL_TABLET | Freq: Every day | ORAL | Status: DC
  Administered 2012-07-10 – 2012-07-12 (×3): 20 mg via ORAL
  Filled 2012-07-10 (×3): qty 1

## 2012-07-10 MED ORDER — DULOXETINE HCL 60 MG PO CPEP
60.0000 mg | ORAL_CAPSULE | Freq: Every day | ORAL | Status: DC
  Administered 2012-07-10 – 2012-07-11 (×2): 60 mg via ORAL
  Filled 2012-07-10 (×3): qty 1

## 2012-07-10 MED ORDER — METOPROLOL SUCCINATE ER 50 MG PO TB24
50.0000 mg | ORAL_TABLET | Freq: Every day | ORAL | Status: DC
  Administered 2012-07-10: 50 mg via ORAL
  Filled 2012-07-10: qty 1

## 2012-07-10 MED ORDER — VANCOMYCIN HCL 500 MG IV SOLR
500.0000 mg | Freq: Two times a day (BID) | INTRAVENOUS | Status: DC
  Administered 2012-07-10: 500 mg via INTRAVENOUS
  Filled 2012-07-10 (×2): qty 500

## 2012-07-10 MED ORDER — POTASSIUM CHLORIDE 10 MEQ/100ML IV SOLN
10.0000 meq | INTRAVENOUS | Status: DC
  Filled 2012-07-10 (×4): qty 100

## 2012-07-10 MED ORDER — GABAPENTIN 300 MG PO CAPS
300.0000 mg | ORAL_CAPSULE | Freq: Two times a day (BID) | ORAL | Status: DC
  Administered 2012-07-10 – 2012-07-12 (×4): 300 mg via ORAL
  Filled 2012-07-10 (×5): qty 1

## 2012-07-10 MED ORDER — POTASSIUM CHLORIDE CRYS ER 20 MEQ PO TBCR
40.0000 meq | EXTENDED_RELEASE_TABLET | ORAL | Status: DC
  Administered 2012-07-10 (×2): 40 meq via ORAL
  Filled 2012-07-10 (×2): qty 2

## 2012-07-10 MED ORDER — METOPROLOL SUCCINATE ER 100 MG PO TB24
100.0000 mg | ORAL_TABLET | Freq: Every day | ORAL | Status: DC
  Administered 2012-07-11: 100 mg via ORAL
  Filled 2012-07-10: qty 1

## 2012-07-10 NOTE — Progress Notes (Signed)
PULMONARY  / CRITICAL CARE MEDICINE  Name: Connie Fitzpatrick MRN: [id number] DOB: 11-Jul-1927    ADMISSION DATE:  07/07/2012 CONSULTATION DATE:  07/08/2012  REFERRING MD :  Berline Chough   CHIEF COMPLAINT:  Hypotension, decreased LOC   BRIEF PATIENT DESCRIPTION:   79 y/o WF- presented to the ED on 2/6 with hypotension, pinpoint pupils, and decreased LOC.  She was given IV fluids and antibiotics were stated with improvement to her BP and mental status.  On 2/7 she experienced A-Fib with RVR and was placed on a cadizem gtt. This was only short term.  PCCM consulted 2/7 to evaluate patient given possible sepsis.    HISTORY OF PRESENT ILLNESS:  79 y/o WF with history of prior alcoholism, SDH, recurrent falls, asthma, hypothyroidism, dementia, and chronic pain from rheumatoid arthritis who presented to [facility] from [facility] 2/6 with decreased consciousness, hypotension, SOB with chest pain prior to the event, and nonproductive cough.  Presented to the ED with hypotension, pinpoint pupils, and decreased LOC.  She was given IV fluids and antibiotics were stated with improvement to her BP and mental status.  Her lactic acid was elevated and she was positive for amphetamines (unclear reason why) and benzos (on Xanax PTA).  Her UA showed many yeast/small bili/ketones/protein/leuks. On 2/7 she experienced A-Fib with RVR and was placed on a cadizem gtt.  The patient is a very poor historian and unable to provide very much history.   SIGNIFICANT EVENTS / STUDIES:  2/6 Admitted from Nursing home 2/6 CT without acute adnormailty 2/7 A-Fib with RVR 2/8  Tx to 6741... 2/9  agitated & not very cooperative...  LINES / TUBES: Foley Cath 2/6>>>  CULTURES: MRSA PCS 2/6 - Negative BCx2 2/6>>>  ANTIBIOTICS: Vanc 2/6>>> Zosyn 2/6>>>   SUBJECTIVE:  Looks better   VITAL SIGNS: Temp:  [97 F (36.1 C)-98.6 F (37 C)] 98 F (36.7 C) (02/09 0358) Pulse Rate:  [89-100] 100 (02/09 0358) Resp:   [17-20] 20 (02/09 0358) BP: (150-175)/(70-96) 170/96 mmHg (02/09 0358) SpO2:  [94 %-100 %] 94 % (02/09 0358) Weight:  [54.6 kg (120 lb 5.9 oz)-55.021 kg (121 lb 4.8 oz)] 55.021 kg (121 lb 4.8 oz) (02/08 2014) 2 liters      INTAKE / OUTPUT: Intake/Output     02/08 0701 - 02/09 0700 02/09 0701 - 02/10 0700   P.O. 240    I.V. (mL/kg)     IV Piggyback     Total Intake(mL/kg) 240 (4.4)    Urine (mL/kg/hr) 1700 (1.3)    Total Output 1700     Net -1460          Urine Occurrence 6 x    Stool Occurrence 3 x      PHYSICAL EXAMINATION: General:  Patient appears in good spirits and cooperative Neuro:  Patient confused to why she is here. Oriented to self and place.  Follows commands in all extremities. No focal deficits.   HEENT:  Pupils PEARL, moist MM, no JVD. Cardiovascular:  Sinus rhythm. No murmur, gallops. Lungs: Clear but diminished throughout. Abdomen:  Soft, round, non distended Musculoskeletal:  Bilateral lower extremity edema, Lt +3, Rt +2.  Rt upper hand and wrist contracted Skin:  Intact  LABS:  Recent Labs Lab 07/07/12 1441  07/07/12 1755 07/08/12 0459 07/08/12 0640 07/08/12 1012 07/09/12 0440  HGB 11.5*  --   --  11.3*  --   --  11.5*  WBC 11.1*  --   --  19.4*  --   --  18.2*  PLT 252  --   --  245  --   --  271  NA 140  --   --  145  --  143 141  K 3.8  --   --  4.2  --  3.9 3.6  CL 104  --   --  111  --  110 108  CO2 25  --   --  24  --  25 25  GLUCOSE 104*  --   --  74  --  91 134*  BUN 43*  --   --  38*  --  34* 24*  CREATININE 1.46*  --   --  1.17*  --  1.12* 0.85  CALCIUM 8.8  --   --  8.6  --  8.7 9.1  AST 15  --   --  16  --   --   --   ALT 14  --   --  12  --   --   --   ALKPHOS 65  --   --  65  --   --   --   BILITOT 0.3  --   --  0.3  --   --   --   PROT 5.0*  --   --  5.0*  --   --   --   ALBUMIN 2.6*  --   --  2.4*  --   --   --   LATICACIDVEN  --   < > 3.53* 1.5  --  1.5  --   TROPONINI <0.30  --   --  <0.30  --   --   --   PROCALCITON   --   --   --   --  27.99  --   --   < > = values in this interval not displayed. No results found for this basename: GLUCAP,  in the last 168 hours   CXR: 2/6> Small bilateral pleural effusions. Bibasilar airspace disease, worse on the left, as likely due to atelectasis CXR 2/8> IMPRESSION: Improvement in pulmonary vascular congestion with somewhat improved aeration. Small effusions remain left greater than right with left basilar atelectasis.    ASSESSMENT / PLAN:  PULMONARY  A:  Bilateral pleural effusions, L>R atx      H/O COPD       R/O HCAP AS SOURCE  P:  F/u CXR => improved vasc congestion, better aeration.      Keep even at this point      pulm hygiene       May be none traditional presentation PNA in elderyl, with atx   CARDIOVASCULAR A: Atrial fibrillation w/ RVR - Patient currently in SR , may have been MAT, clearing lactic acid P:  -d/c cardiazem  - F/u echo -d/c theophylline, assess level   RENAL A:  Acute renal insufficiency - Urine output and SCr improving.   P:   -F/u Chem to monitor renal function. -Monitor urine output -even  Balance goals -Strict I&O   GASTROINTESTINAL A:  R/o dysphagia P:   -Supportive care Consider slp   HEMATOLOGIC A: Leukocytosis in the setting of sepsis Positive D-Dimer - Patient no showing any clinical signs of PE at this time. P:  -F/u cbc  -See ID section - F/u US of lower extremities: => Summary: No evidence of deep vein or superficial thrombosis involving the right lower extremity and left lower extremity. - F/u Echo 2/7 => Study Conclusions: LV cavity  size was normal. Wall thickness was normal. Systolic function was normal. The estimated ejection fraction was in the range of 55% to 60%. Aortic valve: Mild regurgitation. Pulmonary arteries: PA peak pressure: 36mm Hg   INFECTIOUS A:  Septic Shock - In the setting of unknown source, procalcitonin elevated (28 on 2/7), improving lactic acid.  Patient has improved  clinically with IV fluid resuscitation and antibiotics.  R/o HCAP P:   - Cont Empiric IV vanc and zosyn - F/u Cultures - Repeat UA, first not impressive   ENDOCRINE A:   Hypothyroidism Presumed adrenal insuff (chronic pred for RA) DM  P:   chk tsh Agree w/ stress dose roids ssi    NEUROLOGIC A:  H/o dementia, agitation P:   Supportive care Avoid benzo Rx per primary team   Glendia Olshefski M. Kriste Basque, MD Pulmonary and Critical Care Medicine [facility] Pager: [phone]  07/10/2012, 8:33 AM

## 2012-07-10 NOTE — Progress Notes (Signed)
Seen and examined.  Very sleepy now that she has been restarted on alprazolam.  Discussed with Dr. Cristal Ford.  Agree with her management.  Given neg cultures and no source of infection, will need to take a leap of faith and  DC antibiotics.

## 2012-07-10 NOTE — Progress Notes (Signed)
Patient ID: Connie Fitzpatrick, female   DOB: Apr 10, 1928, 79 y.o.   MRN: [id number] Subjective:  Remains somewhat confused but conversant  Objective:  Vital Signs in the last 24 hours: Temp:  [97 F (36.1 C)-98.6 F (37 C)] 98 F (36.7 C) (02/09 0358) Pulse Rate:  [89-100] 100 (02/09 0358) Resp:  [17-20] 20 (02/09 0358) BP: (150-175)/(70-96) 170/96 mmHg (02/09 0358) SpO2:  [94 %-100 %] 94 % (02/09 0358) Weight:  [120 lb 5.9 oz (54.6 kg)-121 lb 4.8 oz (55.021 kg)] 121 lb 4.8 oz (55.021 kg) (02/08 2014)  Intake/Output from previous day: 02/08 0701 - 02/09 0700 In: 240 [P.O.:240] Out: 1700 [Urine:1700] Intake/Output from this shift:    Physical Exam: Well appearing elderly woman, NAD HEENT: Unremarkable Neck:  No JVD, no thyromegally Lungs:  Clear with no wheezes HEART:  Regular rate rhythm, no murmurs, no rubs, no clicks Abd:  soft, positive bowel sounds, no organomegally, no rebound, no guarding Ext:  2 plus pulses, no edema, no cyanosis, no clubbing Skin:  No rashes no nodules Neuro:  Pleasantly demented, CN II through XII intact, motor grossly intact  Lab Results:  Recent Labs  07/08/12 0459 07/09/12 0440  WBC 19.4* 18.2*  HGB 11.3* 11.5*  PLT 245 271    Recent Labs  07/08/12 1012 07/09/12 0440  NA 143 141  K 3.9 3.6  CL 110 108  CO2 25 25  GLUCOSE 91 134*  BUN 34* 24*  CREATININE 1.12* 0.85    Recent Labs  07/07/12 1441 07/08/12 0459  TROPONINI <0.30 <0.30   Hepatic Function Panel  Recent Labs  07/08/12 0459  PROT 5.0*  ALBUMIN 2.4*  AST 16  ALT 12  ALKPHOS 65  BILITOT 0.3   No results found for this basename: CHOL,  in the last 72 hours No results found for this basename: PROTIME,  in the last 72 hours  Imaging: Dg Chest 2 View  07/09/2012  *RADIOLOGY REPORT*  Clinical Data: Hypoxia  CHEST - 2 VIEW  Comparison: Portable chest x-ray of 07/07/2012  Findings: Aeration of the lungs has improved.  There is opacity remaining at the left lung base  most consistent with atelectasis and effusion with only minimal opacity at the right lung base. Cardiomegaly is stable.  The bones are diffusely osteopenic.  IMPRESSION: Improvement in pulmonary vascular congestion with somewhat improved aeration.  Small effusions remain left greater than right with left basilar atelectasis.   Original Report Authenticated By: Dwyane Dee, M.D.     Cardiac Studies: Tele - nsr with pvc's Assessment/Plan:  1. Atrial fib 2. HTN 3. Dementia Rec: she appears to be back to baseline from cardiac perspective. Continue oral diltiazem.   LOS: 3 days    Lewayne Bunting 07/10/2012, 9:01 AM

## 2012-07-10 NOTE — Progress Notes (Signed)
Family Medicine Teaching Service Daily Progress Note Service Pager [phone]  Subjective: Nursing reports agitation this morning. Patient sitting at nurse station for orientation purposes, eating breakfast. She is now hypertensive, weaned off oxygen.  Objective: Vital signs in last 24 hours: Filed Vitals:   07/09/12 1400 07/09/12 1726 07/09/12 2014 07/10/12 0358  BP: 172/85 165/90 175/93 170/96  Pulse: 89 99 89 100  Temp: 98.2 F (36.8 C) 98.4 F (36.9 C) 98.6 F (37 C) 98 F (36.7 C)  TempSrc: Oral Oral Oral Oral  Resp: 18 20 20 20   Height:   5\' 4"  (1.626 m)   Weight: 120 lb 5.9 oz (54.6 kg)  121 lb 4.8 oz (55.021 kg)   SpO2: 98% 100% 98% 94%    Intake/Output Summary (Last 24 hours) at 07/10/12 0834 Last data filed at 07/10/12 0230  Gross per 24 hour  Intake    240 ml  Output   1700 ml  Net  -1460 ml   General appearance: sitting at nurse desk, alert. Oriented to place, not time. Eating oatmeal. Eyes: EOMI Neck: no JVD and supple, symmetrical, trachea midline Lungs: clear to auscultation bilaterally, no distress Heart: regular rate and rhythm, S1, S2 normal, no murmur, click, rub or gallop Abdomen: soft, non-tender; bowel sounds normal; no masses,  no organomegaly Extremities: trace LE edema Pulses: 2+ and symmetric Neuro: normal speech, moving all extremities.  Lab Results:  Recent Labs  07/08/12 0459 07/09/12 0440  WBC 19.4* 18.2*  HGB 11.3* 11.5*  HCT 35.8* 35.4*  PLT 245 271    Recent Labs  07/08/12 1012 07/09/12 0440  NA 143 141  K 3.9 3.6  CL 110 108  CO2 25 25  GLUCOSE 91 134*  BUN 34* 24*  CREATININE 1.12* 0.85  CALCIUM 8.7 9.1   Micro Results: Recent Results (from the past 240 hour(s))  CULTURE, BLOOD (ROUTINE X 2)     Status: None   Collection Time    07/07/12  2:41 PM      Result Value Range Status   Specimen Description BLOOD ARM RIGHT   Final   Special Requests BOTTLES DRAWN AEROBIC AND ANAEROBIC 10CC   Final   Culture  Setup  Time 07/07/2012 22:02   Final   Culture     Final   Value:        BLOOD CULTURE RECEIVED NO GROWTH TO DATE CULTURE WILL BE HELD FOR 5 DAYS BEFORE ISSUING A FINAL NEGATIVE REPORT   Report Status PENDING   Incomplete  CULTURE, BLOOD (ROUTINE X 2)     Status: None   Collection Time    07/07/12  2:52 PM      Result Value Range Status   Specimen Description BLOOD RIGHT UPPER WRIST   Final   Special Requests BOTTLES DRAWN AEROBIC AND ANAEROBIC 10CC   Final   Culture  Setup Time 07/07/2012 22:02   Final   Culture     Final   Value:        BLOOD CULTURE RECEIVED NO GROWTH TO DATE CULTURE WILL BE HELD FOR 5 DAYS BEFORE ISSUING A FINAL NEGATIVE REPORT   Report Status PENDING   Incomplete  MRSA PCR SCREENING     Status: None   Collection Time    07/07/12  8:01 PM      Result Value Range Status   MRSA by PCR NEGATIVE  NEGATIVE Final   Comment:            The GeneXpert  MRSA Assay (FDA     approved for NASAL specimens     only), is one component of a     comprehensive MRSA colonization     surveillance program. It is not     intended to diagnose MRSA     infection nor to guide or     monitor treatment for     MRSA infections.   Studies/Results: ECHO-EF 55-60%, PAP 36 mmHG  Medications: I have reviewed the patient's current medications. Scheduled Meds: . aspirin EC  81 mg Oral Daily  . diltiazem  30 mg Oral Q8H  . heparin  5,000 Units Subcutaneous Q8H  . hydrocortisone sod succinate (SOLU-CORTEF) injection  50 mg Intravenous Q6H  . levothyroxine  100 mcg Oral QAC breakfast  . pantoprazole  40 mg Oral Q0600  . piperacillin-tazobactam (ZOSYN)  IV  3.375 g Intravenous Q8H  . sodium chloride  3 mL Intravenous Q12H  . vancomycin  500 mg Intravenous Q12H   Continuous Infusions: . sodium chloride 50 mL/hr at 07/09/12 1200   PRN Meds:.metoprolol Assessment/Plan: Connie Fitzpatrick is a 79 y.o. female patient PMHx of Dementia and HTN, Hospital day 3 for hypotension, altered mental status, concern  for Sepsis with possible source HCAP vs UTI.  1. SIRS and Hypotension (now resolved) without source of infection:  - patient has improved dramatically with IVF and antibiotics, still unclear source of infection. atyical pneumonia vs UTI. - Follow up urine and blood cultures - Continue IVF and Vanc/Zosyn, follow WBC-will narrow tomorrow if no culture growth. -may restart home meds today-metoprolol, lasix -DC solucortef.  2. Hypoxemia-resolved, on 2L [place] at time of admission, does not use oxygen regularly, points towards lung etiology of infection.   3. A-Flutter with RVR:  - Resolved with diltiazem, hemodynamically stable since yesterday -may restart metoprolol  50 mg (home dose 100 mg XL)  4. Altered Mental status/delirium on background dementia - likely due to hypotension on admission, this improved yesterday -currently maybe withdrawing from home med ativan 0.5mg  BID, will reschedule this today since BP elevated and SIRS resolved.  5. Acute Renal injury: Resolved. - was likely pre-renal due to poor perfusion, has normalized with IVF -restart daily lasix 20 mg with trace LE edema and weight up 3 lbs.  Hypothyroidism: will re-start home synthroid  FEN/GI: continue NS, but will advance diet as patient is now alert, re-start PPI.   DVT PPx: SQ Heparin  Disposition: Telemetry, PT/OT consult, wean oxygen as tolerated. Likely to return to SNF at time of DC, hopefully tomorrow if abx weaned and remains hemodynamically stable.    LOS: 3 days   Connie Fitzpatrick 07/10/2012, 8:34 AM

## 2012-07-11 ENCOUNTER — Inpatient Hospital Stay (HOSPITAL_COMMUNITY): Payer: Medicare Other

## 2012-07-11 LAB — CBC
HCT: 43.5 % (ref 36.0–46.0)
Hemoglobin: 14.5 g/dL (ref 12.0–15.0)
WBC: 12.2 10*3/uL — ABNORMAL HIGH (ref 4.0–10.5)

## 2012-07-11 LAB — BASIC METABOLIC PANEL
Chloride: 103 mEq/L (ref 96–112)
GFR calc Af Amer: 89 mL/min — ABNORMAL LOW (ref >90)
Potassium: 2.7 mEq/L — CL (ref 3.5–5.1)

## 2012-07-11 MED ORDER — PREDNISONE 5 MG PO TABS
5.0000 mg | ORAL_TABLET | Freq: Every day | ORAL | Status: DC
  Administered 2012-07-11 – 2012-07-12 (×2): 5 mg via ORAL
  Filled 2012-07-11 (×3): qty 1

## 2012-07-11 MED ORDER — METOPROLOL SUCCINATE ER 25 MG PO TB24
25.0000 mg | ORAL_TABLET | Freq: Once | ORAL | Status: AC
  Administered 2012-07-11: 25 mg via ORAL
  Filled 2012-07-11: qty 1

## 2012-07-11 MED ORDER — DILTIAZEM HCL 30 MG PO TABS: 30.0000 mg | ORAL_TABLET | Freq: Three times a day (TID) | ORAL | Status: DC

## 2012-07-11 MED ORDER — BENZONATATE 100 MG PO CAPS
100.0000 mg | ORAL_CAPSULE | Freq: Two times a day (BID) | ORAL | Status: DC | PRN
  Administered 2012-07-11 – 2012-07-12 (×2): 100 mg via ORAL
  Filled 2012-07-11 (×3): qty 1

## 2012-07-11 MED ORDER — POTASSIUM CHLORIDE 10 MEQ/100ML IV SOLN
10.0000 meq | INTRAVENOUS | Status: AC
  Administered 2012-07-11 (×4): 10 meq via INTRAVENOUS
  Filled 2012-07-11 (×4): qty 100

## 2012-07-11 MED ORDER — MOMETASONE FURO-FORMOTEROL FUM 100-5 MCG/ACT IN AERO
2.0000 | INHALATION_SPRAY | Freq: Two times a day (BID) | RESPIRATORY_TRACT | Status: DC
  Administered 2012-07-11 – 2012-07-12 (×2): 2 via RESPIRATORY_TRACT
  Filled 2012-07-11: qty 8.8

## 2012-07-11 MED ORDER — ALBUTEROL SULFATE (5 MG/ML) 0.5% IN NEBU
2.5000 mg | INHALATION_SOLUTION | RESPIRATORY_TRACT | Status: DC | PRN
  Administered 2012-07-11: 2.5 mg via RESPIRATORY_TRACT

## 2012-07-11 MED ORDER — ALBUTEROL SULFATE (5 MG/ML) 0.5% IN NEBU
INHALATION_SOLUTION | RESPIRATORY_TRACT | Status: AC
  Administered 2012-07-11: 2.5 mg via RESPIRATORY_TRACT
  Filled 2012-07-11: qty 0.5

## 2012-07-11 NOTE — Discharge Summary (Signed)
Family Medicine Teaching Service Discharge Summary   Patient ID: Connie Fitzpatrick [id number] 79 y.o. 1928/01/23  Admit date: 07/07/2012  Discharge date and time:   Admitting Physician: Barbaraann Barthel, MD   Discharge Physician: Denny Levy, MD  Admission Diagnoses: Lactic acid increased [276.2] Altered mental status [780.97] Acute renal failure [584.9] Hypotension [458.9]  Discharge Diagnoses:  Hypotension Atrial flutter with rapid ventricular rate Leukocytosis Dementia  RA Trigger Finger  Admission Condition: poor  Discharged Condition: stable  Hospital Course: Ms. Gade is an 79 year old female who resides at [facility] who was transferred from SNF on 07/07/12 due to hypotension and altered mental status.  Upon admission she was found to have Leukocytosis and hypoxia and was admitted to Step down Unit.   CV: Hypotension on admission, and she improved with fluid resuscitation.  The night of admission she was found to be in a-flutter with RVR, but was asymptomatic.  She was started on Cardizem drip,  further fluid resuscitated, and antibiotics were started for concern of sepsis.  The a-flutter resolved quickly, and she was transitioned to PO diltiazem.  An Echocardiogram was done that did not show significant heart failure or valvular disease. On the day of discharge the patient did have some mildly elevated blood pressures, and her home blood pressure medications and lasix were re-started.   Renal: Patient had acute renal failure with admission creatinine of 1.46, that slowly trended down during hospitalization, and was stable at 0.75 at discharge.   ID: Patient's vital signs and lab studies met SIRS criteria, and antibiotics were started.  However, a Urinalysis and Chest x-ray were unremarkable for infection.  Urine and blood cultures were obtained and were negative.  Antibiotics were discontinued on 2/9 and patient remained afebrile, with normal WBC and vital signs until discharge.    Pulm: patient was hypoxic to low 80's on room air on admission, which was concerning for PNA, but two CXR were done with no infiltrates. As patient's mental status improved she became more alert and was weaned off nasal cannula.   Altered mental status: Patient has dementia at baseline, but is normally talkative.  On admission she was hyper somnolent.  This improved within 24 hours after fluid resuscitation. Again, no definitive infection identified to explain altered mentation.  On day of discharge patient was oriented to self and place, but not to date, which is her baseline. The inpatient team felt that her hypotension, ARF, and AMS may have been due to poor fluid intake and dehydration as no definitive infection could be found.   MSK:  She has significant RA changes.  Left 3rd finger with pain and triggering on exam.  Trigger finger injection performed immediately prior to discharge.  Consults: cardiology and pulmonary/intensive care  Significant Diagnostic Studies: CT Head on 07/07/12:  IMPRESSION:  1. No acute intracranial abnormality or significant interval  change.  2. Atrophy and extensive white matter disease. This likely  reflects the sequelae of chronic microvascular ischemia.  3. Persistent fluid in the left sphenoid sinus.  Chest XR 07/07/12: IMPRESSION:  1. Cardiomegaly and vascular congestion.  2. Small bilateral pleural effusions. Bibasilar airspace disease,  worse on the left, as likely due to atelectasis.  Echocardiogram on 07/08/12:  Study Conclusions - Left ventricle: The cavity size was normal. Wall thickness was normal. Systolic function was normal. The estimated ejection fraction was in the range of 55% to 60%. - Aortic valve: Mild regurgitation. - Pulmonary arteries: PA peak pressure: 36mm Hg (S).  Transthoracic echocardiography. M-mode, complete 2D, spectral Doppler, and color Doppler. Height: Height: 162.6cm. Height: 64in. Weight: Weight: 53.2kg.  Weight: 117lb. Body mass index: BMI: 20.1kg/m^2. Body surface area: BSA: 1.7m^2. Blood pressure: 99/52.  Chest XR 07/09/12:  IMPRESSION:  Improvement in pulmonary vascular congestion with somewhat improved  aeration. Small effusions remain left greater than right with left  basilar atelectasis.  Discharge Exam: BP 168/72  Pulse 68  Temp(Src) 98.2 F (36.8 C) (Oral)  Resp 18  Ht 5\' 4"  (1.626 m)  Wt 115 lb 3.2 oz (52.254 kg)  BMI 19.76 kg/m2  SpO2 98% General appearance: alert and cooperative, says she thinks she is in [place] Lungs: clear to auscultation bilaterally Heart: regular rate and rhythm, S1, S2 normal, no murmur, click, rub or gallop Abdomen: soft, non-tender; bowel sounds normal; no masses,  no organomegaly Extremities: extremities normal, atraumatic, no cyanosis or edema Pulses: 2+ and symmetric  Disposition: 03-Skilled Nursing Facility  Patient Instructions:    Medication List    STOP taking these medications       theophylline 100 MG 12 hr tablet  Commonly known as:  THEODUR      TAKE these medications       acetaminophen 500 MG tablet  Commonly known as:  TYLENOL  Take 500 mg by mouth every 6 (six) hours as needed. For pain/fever     albuterol 108 (90 BASE) MCG/ACT inhaler  Commonly known as:  PROVENTIL HFA;VENTOLIN HFA  Inhale 2 puffs into the lungs every 4 (four) hours as needed. For shortness of breath     albuterol (5 MG/ML) 0.5% nebulizer solution  Commonly known as:  PROVENTIL  Take 0.5 mLs (2.5 mg total) by nebulization every 4 (four) hours as needed for wheezing or shortness of breath.     ALPRAZolam 0.5 MG tablet  Commonly known as:  XANAX  Take 0.5-1 mg by mouth 2 (two) times daily. Take 0.5 mg in the morning and 1 mg in the evening     azelastine 137 MCG/SPRAY nasal spray  Commonly known as:  ASTELIN  Place 2 sprays into the nose 2 (two) times daily. Use in each nostril as directed     benzonatate 100 MG capsule  Commonly  known as:  TESSALON  Take 1 capsule (100 mg total) by mouth 2 (two) times daily as needed for cough.     bisacodyl 5 MG EC tablet  Commonly known as:  DULCOLAX  Take 5 mg by mouth at bedtime.     CALTRATE 600 PLUS-VIT D PO  Take 1 tablet by mouth every evening.     colchicine 0.6 MG tablet  Take 0.6 mg by mouth daily with breakfast.     diltiazem 30 MG tablet  Commonly known as:  CARDIZEM  Take 1 tablet (30 mg total) by mouth 3 (three) times daily.     donepezil 10 MG tablet  Commonly known as:  ARICEPT  Take 10 mg by mouth daily with breakfast.     DULoxetine 60 MG capsule  Commonly known as:  CYMBALTA  Take 60 mg by mouth at bedtime.     febuxostat 40 MG tablet  Commonly known as:  ULORIC  Take 40 mg by mouth daily with breakfast.     furosemide 20 MG tablet  Commonly known as:  LASIX  Take 1 tablet (20 mg total) by mouth daily.     gabapentin 300 MG capsule  Commonly known as:  NEURONTIN  Take 300 mg by  mouth 2 (two) times daily.     guaiFENesin 100 MG/5ML liquid  Commonly known as:  ROBITUSSIN  Take 100 mg by mouth 3 (three) times daily as needed. For congestion     ipratropium-albuterol 0.5-2.5 (3) MG/3ML Soln  Commonly known as:  DUONEB  Take 3 mLs by nebulization every 6 (six) hours as needed. For shortness of breath     levothyroxine 100 MCG tablet  Commonly known as:  SYNTHROID, LEVOTHROID  Take 100 mcg by mouth daily with breakfast.     loratadine 10 MG tablet  Commonly known as:  CLARITIN  Take 10 mg by mouth daily.     magic mouthwash Soln  Take 5 mLs by mouth at bedtime.     magnesium hydroxide 400 MG/5ML suspension  Commonly known as:  MILK OF MAGNESIA  Take 30 mLs by mouth daily as needed. For constipation     Melatonin 5 MG Caps  Take 5 mg by mouth at bedtime.     metoprolol succinate 100 MG 24 hr tablet  Commonly known as:  TOPROL-XL  Take 100 mg by mouth at bedtime. Take with or immediately following a meal.      mometasone-formoterol 100-5 MCG/ACT Aero  Commonly known as:  DULERA  Inhale 2 puffs into the lungs 2 (two) times daily.     multivitamin with minerals Tabs  Take 1 tablet by mouth at bedtime.     multivitamin-lutein Caps  Take 1 capsule by mouth daily with breakfast.     NAMENDA 10 MG tablet  Generic drug:  memantine  Take 1 tablet (10 mg total) by mouth daily.     PEPCID 20 MG tablet  Generic drug:  famotidine  Take 1 tablet (20 mg total) by mouth at bedtime.     potassium chloride 10 MEQ tablet  Commonly known as:  K-DUR  Take 10 mEq by mouth daily with breakfast.     predniSONE 5 MG tablet  Commonly known as:  DELTASONE  Take 5 mg by mouth daily.     traMADol 50 MG tablet  Commonly known as:  ULTRAM  Take 50 mg by mouth every 8 (eight) hours as needed. For pain     VENASTAT 300 MG Cpcr  Generic drug:  [place]  Take 300 mg by mouth every evening.     Vitamin D (Ergocalciferol) 50000 UNITS Caps  Commonly known as:  DRISDOL  Take 50,000 Units by mouth every 30 (thirty) days.     vitamin E 400 UNIT capsule  Take 400 Units by mouth every evening.       Activity: Up with assistance only.  Diet: regular diet, please ensure patient is well hydrated, make sure she has water with each meal. Remove Fluid restrictions.  Good systolic function Wound Care: none needed  Follow-up with Dr. Cherylann Ratel Resident in 1 week. At SNF  Signed: Andrena Mews, DO [facility] Resident - PGY-2 07/12/2012 5:28 PM

## 2012-07-11 NOTE — Progress Notes (Signed)
Family Medicine Teaching Service Daily Progress Note Service Page: [phone]  Patient Assessment: 79 yo F presented 07/07/2012 with AMS, hypotension and SOB.  Subjective: The patient reports she is feeling better today including feeling more "alert" and is enjoying her breakfast. She denies being in any pain and states she feels she is ready to go home. She notes she has not had the chance to ambulate by herself. Pt denies CP, SOB, urinary symptoms.  Objective: Temp:  [97.4 F (36.3 C)-98.2 F (36.8 C)] 97.4 F (36.3 C) (02/10 0459) Pulse Rate:  [62-96] 62 (02/10 0459) Resp:  [16-20] 20 (02/10 0459) BP: (146-168)/(83-94) 150/83 mmHg (02/10 0459) SpO2:  [94 %-98 %] 94 % (02/10 0459) Weight:  [125 lb 12.8 oz (57.063 kg)] 125 lb 12.8 oz (57.063 kg) (02/09 2213)  Exam: General: The patient was sitting upright in bed eating breakfast. Pleasant, no acute distress, good eye contact and appropriate manner. Cardiovascular: RRR, no rubs, murmurs, gallops, normal S1/S2. Respiratory: Clear vesicular breath sounds bilaterally. No wheezing, crackles. A couple coughs on deep inspiration x 2. Abdomen: Non-distended, non-tender, + BS, soft. Extremities: Chronic dermatitic changes on lower extremities, no weeping of sores or appearance of infection. Purpura and superficial ulcerations on both extremities. RA changes to hands noted.  I have reviewed the patient's medications, labs, imaging, and diagnostic testing.  Notable results are summarized below.  Medications:  Scheduled Meds: Xanax 0.5 mg PO BID Aspirin EC 81 mg PO qd Cardizem 30 mg PO q8h Cymbalta 60 mg PO qhs Lasix 20 mg qd Neurontin 300 mg PO BID Heparin Injection 5,000 U [place] q8h Levothyroxine 100 mcg PO qam Metoporolol succinate 100 mg PO qd Protonix 40 mg PO qd  PRN Meds: Tylenol 650 mg q8h PRN Metoprolol injection 2.5 mg IV, q3h PRN  IVF: 10 mL/hr IV- KVO  Labs:  CBC  Recent Labs Lab 07/09/12 0440 07/10/12 0905  07/11/12 0640  WBC 18.2* 19.5* 12.2*  HGB 11.5* 13.3 14.5  HCT 35.4* 40.4 43.5  PLT 271 345 329    BMET  Recent Labs Lab 07/09/12 0440 07/10/12 0905 07/11/12 0640  NA 141 147* 145  K 3.6 2.4* 2.7*  CL 108 103 103  CO2 25 30 33*  BUN 24* 15 18  CREATININE 0.85 0.72 0.75  GLUCOSE 134* 133* 84  CALCIUM 9.1 9.6 9.5    Imaging/Diagnostic Tests:  CXR- 07/09/2012  Comparison: Portable chest x-ray of 07/07/2012  Findings: Aeration of the lungs has improved. There is opacity  remaining at the left lung base most consistent with atelectasis  and effusion with only minimal opacity at the right lung base.  Cardiomegaly is stable. The bones are diffusely osteopenic.   IMPRESSION:  Improvement in pulmonary vascular congestion with somewhat improved  aeration. Small effusions remain left greater than right with left  basilar atelectasis.  Assessment/Plan:  # SIRS and Hypotension (now resolved) without source of infection Does not currently meet SIRS criteria. Patient has improved dramatically with IVF and antibiotics, still unclear source of infection. atyical pneumonia vs UTI.  Follow up urine and blood cultures  Continue IVF and Vanc/Zosyn, follow WBC-will narrow tomorrow if no culture growth.  may restart home meds today-metoprolol, lasix Plan for d/c to [facility] today.   # Hypoxemia Resolved.- 94 - 98 % O2 RA.  on 2L [place] at time of admission, does not use oxygen regularly, points towards lung etiology of infection.   # A-Flutter with RVR Resolved with diltiazem, hemodynamically stable x 2 days. Metoprolol  succinate 100 mg Po qd  # Altered Mental status/delirium on background dementia  Likely due to hypotension on admission- improved. Xanax restarted yesterday 0.5mg  BID- home med.   # Acute Renal injury: Resolved Cr 1.17 on admission, last creatinine 0.75 today (07/11/2012) was likely pre-renal due to poor perfusion, has normalized with IVF  Lasix 20 mg PO qd  #.  Hypothyroidism:  Synthroid 100 mcg qam  FEN/GI: continue NS 10 mL/hr, monitor electrolytes on ordered bw, Protonix 40 mg PO qd  DVT PPx: SQ Heparin   Disposition: Likely to return to [facility] today on DC.  The patient was discussed with and seen/examined by Dr. Lula Olszewski and Dr. Jennette Kettle.  Ronnette Juniper [facility] Family Medicine  PGY3 Addendum to MS4 Note: I have seen and examined this patient and agree with the above note.  [place]:   S: Patient sleeping, wakes to voice, denies complaints.   O:  BP 150/83  Pulse 62  Temp(Src) 97.4 F (36.3 C) (Oral)  Resp 20  Ht 5\' 4"  (1.626 m)  Wt 125 lb 12.8 oz (57.063 kg)  BMI 21.58 kg/m2  SpO2 94% General appearance: alert, cooperative and no distress Lungs: clear to auscultation bilaterally Heart: regular rate and rhythm, S1, S2 normal, no murmur, click, rub or gallop Abdomen: soft, non-tender; bowel sounds normal; no masses,  no organomegaly Extremities: extremities normal, atraumatic, no cyanosis or edema Pulses: 2+ and symmetric  A/P:   #CV:   SIRS and hypotension without source of infection: - Resolved, antibiotics were discontinued 24 hours ago, she has remained stable, will continue home BP medications A-Flutter with RVR - Resolved, continue PO diltazem  # Hypoxemia: resolved, stable on room air, may have been due to atelectasis/poor inspiration due to AMS  # Altered mental status: with baseline dementia.  Seems to be improved with hydration and normal BP's, now back on home psych medications  # Acute Renal Failure: Improved, re-start home Lasix.  #Hypotyroidism: continue synthroid  # FEN/GI: saline lock IV, encourage PO intake, PPI, replace K+.   # DVT PPx: SQ heparin  #Disposition: Patient clinically and mentally back to baseline, can likely be discharged back to [facility] today.   Laurine Kuyper 07/11/2012 9:53 AM

## 2012-07-11 NOTE — Clinical Social Work Note (Signed)
CSW talked with patient's husband and daughter regarding SNF placement. They were given a SNF list with bed offers, however CSW advised that patient will return to [facility] today. Facility contacted and advised that patient ready for discharge.  CSW contacted Bjorn Loser in admissions at [facility] later today and advised patient will not d/c today as d/c paperwork not completed. CSW will follow-up on 2/11 regarding patient discharging back to [place].  Genelle Bal, MSW, LCSW [phone]

## 2012-07-11 NOTE — Progress Notes (Signed)
Pt transferred to 6700. This CSW has informed new unit CSW who will facilitate with dc plans. This CSW signing off.  Theresia Bough, MSW, Theresia Majors [phone]

## 2012-07-11 NOTE — Progress Notes (Signed)
CRITICAL VALUE ALERT  Critical value received:  Potassium 2.7  Date of notification:  07/11/12   Time of notification:  0752  Critical value read back:yes  Nurse who received alert:  Lavell Anchors, RN  MD notified (1st page):  Family Medicine Teaching Service  Time of first page:  [phone]  MD notified (2nd page):  Time of second page:  Responding MD:  Birdie Sons  Time MD responded:  [phone]

## 2012-07-12 DIAGNOSIS — M6530 Trigger finger, unspecified finger: Secondary | ICD-10-CM | POA: Diagnosis present

## 2012-07-12 HISTORY — DX: Trigger finger, unspecified finger: M65.30

## 2012-07-12 LAB — BASIC METABOLIC PANEL
BUN: 27 mg/dL — ABNORMAL HIGH (ref 6–23)
CO2: 30 mEq/L (ref 19–32)
Chloride: 104 mEq/L (ref 96–112)
Glucose, Bld: 97 mg/dL (ref 70–99)
Potassium: 3.7 mEq/L (ref 3.5–5.1)

## 2012-07-12 LAB — CBC
HCT: 42.4 % (ref 36.0–46.0)
Hemoglobin: 13.8 g/dL (ref 12.0–15.0)
MCH: 30.1 pg (ref 26.0–34.0)
MCHC: 32.5 g/dL (ref 30.0–36.0)

## 2012-07-12 MED ORDER — FUROSEMIDE 20 MG PO TABS: 20.0000 mg | ORAL_TABLET | Freq: Every day | ORAL | Status: DC

## 2012-07-12 MED ORDER — METOPROLOL SUCCINATE ER 100 MG PO TB24
100.0000 mg | ORAL_TABLET | Freq: Every day | ORAL | Status: DC
  Administered 2012-07-12: 100 mg via ORAL
  Filled 2012-07-12: qty 1

## 2012-07-12 MED ORDER — METOPROLOL SUCCINATE ER 100 MG PO TB24
100.0000 mg | ORAL_TABLET | Freq: Every day | ORAL | Status: DC
  Filled 2012-07-12: qty 1

## 2012-07-12 MED ORDER — MOMETASONE FURO-FORMOTEROL FUM 100-5 MCG/ACT IN AERO: 2.0000 | INHALATION_SPRAY | Freq: Two times a day (BID) | RESPIRATORY_TRACT | Status: DC

## 2012-07-12 MED ORDER — ALBUTEROL SULFATE (5 MG/ML) 0.5% IN NEBU: 2.5000 mg | INHALATION_SOLUTION | RESPIRATORY_TRACT | Status: DC | PRN

## 2012-07-12 MED ORDER — BENZONATATE 100 MG PO CAPS: 100.0000 mg | ORAL_CAPSULE | Freq: Two times a day (BID) | ORAL | Status: DC | PRN

## 2012-07-12 NOTE — Progress Notes (Signed)
Pt prepared for discharge. IV and tele removed. Paper gown placed. Report called to Olu, LPN at [facility]. Pt remains stable. Pt awaiting transportation to facility. [place], Rosanna Randy

## 2012-07-12 NOTE — Progress Notes (Signed)
Physical Therapy Treatment Patient Details Name: Connie Fitzpatrick MRN: [id number] DOB: 1928/04/20 Today's Date: 07/12/2012 Time: 4098-1191 PT Time Calculation (min): 31 min  PT Assessment / Plan / Recommendation Comments on Treatment Session  Pt is 80 yo female, likely to d/c to SNF today. She is very motivated to progress with therapy to be able to walk again and she enjoyed doing LE exercises though is very weak with strength <3/5 in hip abductors and hip flexors. Transferred bed to chair with min A and maintained standing x1 min. PT will continue to follow if she remains here.    Follow Up Recommendations  SNF     Does the patient have the potential to tolerate intense rehabilitation     Barriers to Discharge        Equipment Recommendations  None recommended by PT    Recommendations for Other Services    Frequency Min 2X/week   Plan Discharge plan remains appropriate;Frequency remains appropriate    Precautions / Restrictions Precautions Precautions: Fall Restrictions Weight Bearing Restrictions: No   Pertinent Vitals/Pain VSS    Mobility  Bed Mobility Bed Mobility: Rolling Right;Rolling Left;Right Sidelying to Sit;Sitting - Scoot to Edge of Bed Rolling Right: 3: Mod assist;With rail Rolling Left: 4: Min assist;With rail Right Sidelying to Sit: 4: Min assist;HOB flat;With rails Sitting - Scoot to Edge of Bed: 5: Supervision Details for Bed Mobility Assistance: Pt able to initiate rolling on her own but has difficulty getting fully on her side, needs mod A to the right due to inability to pull as well on the rail with the left UE, better roll to the left but min A still needed to get full onto side. Once in sitting, pt able to scoot self to EOB. Transfers Transfers: Sit to Stand;Stand to [employer] Transfers Sit to Stand: 4: Min assist;From bed;With upper extremity assist Stand to Sit: 4: Min assist;To chair/3-in-1;With upper extremity assist Stand Pivot Transfers: 4:  Min assist Details for Transfer Assistance: Pt able to achieve full standing today and step feet during pivot. Was able to extend through the trunk with manual facilitation at the hips but pt reported that she felt that stretch in her back Ambulation/Gait Ambulation/Gait Assistance: Not tested (comment) Stairs: No Wheelchair Mobility Wheelchair Mobility: No    Exercises Total Joint Exercises Bridges: AROM;10 reps;Supine General Exercises - Lower Extremity Quad Sets: AROM;Both;10 reps;Supine Long Arc Quad: AROM;5 reps;Both;Seated Hip ABduction/ADduction: Both;10 reps;AAROM;Sidelying Straight Leg Raises: AROM;Both;10 reps;Supine   PT Diagnosis:    PT Problem List:   PT Treatment Interventions:     PT Goals Acute Rehab PT Goals PT Goal Formulation: With patient/family Time For Goal Achievement: 07/16/12 Potential to Achieve Goals: Fair Pt will go Supine/Side to Sit: with rail;with HOB not 0 degrees (comment degree);with supervision PT Goal: Supine/Side to Sit - Progress: Progressing toward goal Pt will go Sit to Supine/Side: with min assist;with HOB 0 degrees;with rail PT Goal: Sit to Supine/Side - Progress: Progressing toward goal Pt will go Sit to Stand: with min assist (min-guard) PT Goal: Sit to Stand - Progress: Progressing toward goal Pt will go Stand to Sit: Other (comment);with min assist (min-guard) PT Goal: Stand to Sit - Progress: Progressing toward goal Pt will Transfer Bed to Chair/Chair to Bed: Other (comment);with min assist (min-guard) PT Transfer Goal: Bed to Chair/Chair to Bed - Progress: Progressing toward goal Pt will Stand: with min assist;1 - 2 min;with bilateral upper extremity support PT Goal: Stand - Progress: Progressing toward goal  Visit Information  Last PT Received On: 07/12/12 Assistance Needed: +1    Subjective Data  Subjective: I need to start working so that I can walk again Patient Stated Goal: be able to walk   Cognition   Cognition Overall Cognitive Status: History of cognitive impairments - at baseline Arousal/Alertness: Awake/alert Orientation Level: Disoriented to;Time Behavior During Session: [facility] for tasks performed    Balance  Balance Balance Assessed: Yes Static Sitting Balance Static Sitting - Balance Support: Feet supported Static Sitting - Level of Assistance: 5: Stand by assistance Static Sitting - Comment/# of Minutes: 10  End of Session PT - End of Session Equipment Utilized During Treatment: Gait belt Activity Tolerance: Patient tolerated treatment well Patient left: in chair;with call bell/phone within reach Nurse Communication: Mobility status   GP   Lyanne Co, PT  Acute Rehab Services  [phone]   Lyanne Co 07/12/2012, 10:35 AM

## 2012-07-12 NOTE — Progress Notes (Signed)
Family Medicine Teaching Service Daily Progress Note Service Page: [phone]  Patient Assessment: 79 yo F presented 07/07/2012 with AMS, hypotension and SOB.  Subjective: The patient states she is feeling "well" this morning. She slept well last night and is A&O x 3 and is not sure why she was in the hospital initially. The patient says her cough is either the same as yesterday or better and that she has had a cough for a long time but this cough sounds worse to her. The patient further identifies that she feels ready to go home today but would like her trigger finger injected before she leaves if possible. She states someone told her yesterday that we could and would inject it here before she leaves. Due to the patient's poor baseline status it is unclear if this is true or not.  Objective: Temp:  [97.6 F (36.4 C)-98 F (36.7 C)] 97.9 F (36.6 C) (02/11 0522) Pulse Rate:  [60-72] 64 (02/11 0522) Resp:  [18-20] 18 (02/11 0522) BP: (130-187)/(60-76) 187/74 mmHg (02/11 0522) SpO2:  [93 %-96 %] 95 % (02/11 0522) Weight:  [115 lb 3.2 oz (52.254 kg)] 115 lb 3.2 oz (52.254 kg) (02/10 2025)  Exam: General: The patient was sitting upright in bed awake and alert. Pleasant, no acute distress, good eye contact and appropriate manner.  Cardiovascular: RRR, no rubs, murmurs, gallops, normal S1/S2.  Respiratory: Clear vesicular breath sounds bilaterally. No wheezing, crackles.  Abdomen: Non-distended, non-tender, + BS, soft.  Extremities: Chronic dermatitic changes on lower extremities, no weeping of sores or appearance of infection. Purpura and superficial ulcerations on both extremities. RA changes to hands noted. Trigger finger on 3rd L digit.   I have reviewed the patient's medications, labs, imaging, and diagnostic testing.  Notable results are summarized below.  Medications:  Scheduled Meds:  Xanax 0.5 mg PO BID  Aspirin EC 81 mg PO qd  Cardizem 30 mg PO q8h  Cymbalta 60 mg PO qhs  Lasix 20  mg qd  Neurontin 300 mg PO BID  Heparin Injection 5,000 U [place] q8h  Levothyroxine 100 mcg PO qam  Metoporolol succinate 100 mg PO qd  Protonix 40 mg PO qd Dulera 100-5 mcg/act inhaler 2 puff BID  PRN Meds:  Tylenol 650 mg q8h PRN  Metoprolol injection 2.5 mg IV, q3h PRN  Albuterol nebulization 2.5 mg q4h PRN Tessalon 100 mg PO BID PRN  IVF:  10 mL/hr IV- KVO   Labs:  CBC  Recent Labs Lab 07/10/12 0905 07/11/12 0640 07/12/12 0550  WBC 19.5* 12.2* 10.7*  HGB 13.3 14.5 13.8  HCT 40.4 43.5 42.4  PLT 345 329 309    BMET  Recent Labs Lab 07/09/12 0440 07/10/12 0905 07/11/12 0640  NA 141 147* 145  K 3.6 2.4* 2.7*  CL 108 103 103  CO2 25 30 33*  BUN 24* 15 18  CREATININE 0.85 0.72 0.75  GLUCOSE 134* 133* 84  CALCIUM 9.1 9.6 9.5    Imaging/Diagnostic Tests:  ECHO- 07/08/2012 Study Conclusions  - Left ventricle: The cavity size was normal. Wall thickness was normal. Systolic function was normal. The estimated ejection fraction was in the range of 55% to 60%. - Aortic valve: Mild regurgitation. - Pulmonary arteries: PA peak pressure: 36mm Hg (S).  CXR- 07/09/2012  Comparison: Portable chest x-ray of 07/07/2012  Findings: Aeration of the lungs has improved. There is opacity  remaining at the left lung base most consistent with atelectasis  and effusion with only minimal opacity at  the right lung base.  Cardiomegaly is stable. The bones are diffusely osteopenic.   IMPRESSION:  Improvement in pulmonary vascular congestion with somewhat improved  aeration. Small effusions remain left greater than right with left  basilar atelectasis.  CXR- 07/11/2012 Comparison: 07/09/2012  Findings: No frank interstitial edema. Mild patchy left basilar  opacity, likely atelectasis. Possible small left pleural effusion,  although improved.  No pneumothorax.  The heart is normal in size.  Degenerative changes with scoliosis of the visualized thoracolumbar  spine.    IMPRESSION:  Mild left basilar atelectasis with possible small left pleural  effusion, improved.   Assessment/Plan:  # SIRS and Hypotension (now resolved) without source of infection  Does not currently meet SIRS criteria.  WBC 19.4 on 07/08/2012, 10.7 this AM (07/12/2012). Lactic acid 3.46 on 07/07/2012 down to 1.5 on 07/08/2012. Patient has improved dramatically with IVF and antibiotics, still unclear source of infection. atyical pneumonia vs UTI.   Blood cultures, pending but no growth since 07/08/2012. Urine culture- catheter, 85,000 yeast growth. Lasix 20 mg qd and Metoporolol succinate 100 mg PO qd restarted. Plan for d/c to [facility] today.   #. Cough Pt complains got worse 07/11/2012. Repeat CXR 07/11/2012- Improving since 07/09/2012. Tessalon 100 mg PO BID PRN- Pt reports helps with cough. Dulera added, Albuterol PRN. ECHO- 07/08/2012 WNL.- EF 55-60%. Likely viral resolution etiology  #. Hypokalemia 3.6 on 07/09/2012, 2.4 on 07/10/2012, 2.7 on 07/11/2012 s/p 40 mEq K IV given on 2/10 after BW, no repeat BW. Estimated 80 mEq still needed to replenish to 3.6. 40 mEq K PO x 3 doses to replenish to 3.9.  #. Trigger finger. L 3rd digit. Possible cortisone injection to be done today before dc.  # Hypoxemia  Resolved.- 94 - 98 % O2 RA.  on 2L [place] at time of admission, does not use oxygen regularly, points towards lung etiology of infection.  #. Urine culture- Catheter Grow 85,000 Yeast Blood culture- pending from 07/08/2012, no growth yet.   # A-Flutter with RVR  Resolved with diltiazem, hemodynamically stable x 2 days.  Metoprolol succinate 100 mg Po qd   # Altered Mental status/delirium on background dementia  Likely due to hypotension on admission- improved.  Xanax restarted yesterday 0.5mg  BID- home med.   # Acute Renal injury: Resolved Cr 1.17 on admission, last creatinine 0.75 today (07/11/2012)  was likely pre-renal due to poor perfusion, has normalized with IVF  Lasix 20 mg  PO qd   #. Hypothyroidism:  Synthroid 100 mcg qam   #. Rheumatoid Arthritis Deltasone 5 mg PO qam  FEN/GI: continue NS 10 mL/hr, monitor electrolytes on ordered bw, Protonix 40 mg PO qd.  DVT PPx: SQ Heparin.   Disposition: Likely to return to [place] today on DC.   The patient was discussed with and seen/examined by Dr. Berline Chough  and Dr. Jennette Kettle.   Ronnette Juniper [facility] Family Medicine   R2 Addendum: Pt feeling better.  Much more alert and appropriate.   No acute concerns voiced. Feels ready to go back "home"  OBJECTIVE: BP 174/69  Pulse 72  Temp(Src) 98 F (36.7 C) (Oral)  Resp 18  Ht 5\' 4"  (1.626 m)  Wt 115 lb 3.2 oz (52.254 kg)  BMI 19.76 kg/m2  SpO2 96% PE: GENERAL:  Adult Elderly.  female. In no discomfort; no respiratory distress. PSYCH: Alert and appropriately interactive; Insight:Good   H&N: AT/, trachea midline EENT:  MMM, no scleral icterus, EOMi HEART: RRR, S1/S2 heard, no murmur LUNGS: CTA  B, no wheezes, no crackles EXTREMITIES: Moves all 4 extremities spontaneously, warm well perfused, no edema, bilateral DP and PT pulses 2/4.  Profound Rheumatic changes.  Left 3rd trigger finger  A&P: # SIRS and Hypotension - Improved  Lactate improve; PCT improving  Off ABX  # Cough  Continues to persist, non-productive  Started on [employer] Theophyline  # + Amphetamine in UDS  Consider from Theophyline  # Trigger Finger: Left 3rd Digit  Inject today  # A-Flutter with RVR - resolved; HTN  Metoprolol,   # AMS resolved; Delirium  # RA:  Continue Deltasone, s/p stress dosing  DISPO: Plan to d/c to [facility] today.   Andrena Mews, DO [facility] Resident - PGY-2 07/12/2012 12:36 PM

## 2012-07-12 NOTE — Discharge Summary (Signed)
Family Medicine Teaching Service  Discharge Note : Attending Sara Neal MD Pager [phone] Office [phone] I have seen and examined this patient, reviewed their chart and discussed discharge planning wit the resident at the time of discharge. I agree with the discharge plan as above.  

## 2012-07-12 NOTE — Clinical Social Work Note (Signed)
Patient medically stable for discharge back to [facility] today. Discharge paperwork forrwarded to facility and medical packet compiled and will accompany patient to facility. Patient's husband contacted and he is at the facility waiting for patient to arrive.  Genelle Bal, MSW, LCSW [phone]

## 2012-07-12 NOTE — Procedures (Signed)
Trigger Finger Injection Performed today:  The left 3rd hand trigger finger with nodule noted above the a1 pulley.  Significant RA changes of the left wrist limiting digit extension.  The risks, benefits, and expected outcomes of the injection were reviewed and she wishes to undergo the above named procedure.  After an appropriate time out was taken, the Left was prepped in a clean fashion and injected from a direct palmar approach towards the A1 pulley, with a 25g syringe using 0.5cc of 1% plain Lidocaine and 0.5mg  of Kenalog (80mg /mL).  A bandaid was applied to the area.  The finger was taken through a full ROM and there is no further catching.  This procedure was well tolerated and there were no complications.  Andrena Mews, DO [facility] Resident - PGY-2 07/12/2012 2:24 PM

## 2012-07-12 NOTE — Progress Notes (Signed)
FMTS Attending Daily Note: Quartez Lagos MD [phone] pager office [phone] I  have seen and examined this patient, reviewed their chart. I have discussed this patient with the resident. I agree with the resident's findings, assessment and care plan. 

## 2012-07-12 NOTE — Procedures (Signed)
FMTS Attending Daily Note: Connie Levy MD [phone] pager office [phone] I  have seen and examined this patient, reviewed their chart. I discussed a trigger finger injection with her and her family at length yesterday and they wished to pursue that, Dr. Sharma Covert performed the injection today. I was not present for the procedure and she will not be charged for the procedure. i did see her after the procedure was completed and she had no complications.Marland KitchenMarland Kitchen

## 2012-07-13 ENCOUNTER — Non-Acute Institutional Stay: Payer: Self-pay | Admitting: Family Medicine

## 2012-07-13 LAB — CULTURE, BLOOD (ROUTINE X 2): Culture: NO GROWTH

## 2012-07-13 NOTE — Progress Notes (Signed)
Subjective:     Patient ID: Connie Fitzpatrick, female   DOB: September 12, 1927, 79 y.o.   MRN: [id number]  HPI Patient seen for return from hospital follow up.   She reports that she is doing well. Admits to intermittent cough with sputum. Denies wheezing, fever, chills and chest pain. Reports that she does not remember anything from last Thursday to yesterday.   She admits that at home she was taking 1/2 40 mg lasix tablet daily. She has chronic L ankle swelling associated to injury in her 32s.   Hospital course reviewed.   Review of Systems As per HPI     Objective:   Physical Exam BP 151/66  Pulse 53  Temp(Src) 96 F (35.6 C)  Resp 20  SpO2 96% General appearance: alert, cooperative and sitting in her wheelchair, wheeling around her room. Oriented to person, month and year, and place (nursing home).  Lungs: normal WOB, diffusely coarse, no wheezing.  Heart: regular rate and rhythm, S1, S2 normal, no murmur, click, rub or gallop Extremities: no edema on R. trace LE edema on L.   Labs 07/13/11 -see lab section. Normal K and platelets.      Assessment and Plan:     79 yo F seen for post hospital f/u.   1. HTN/ A fib:  Patient BP near goal for age.  norvasc decreased from 180 mg XR to 30 mg TID. Will monitor BP and HR and titrate prn.  Continue metoprolol at current dose.   2. Dehydration: Patient hypotensive and dehydrated on admission to hospital.  Reportedly on fluid restriction and lasix.  Normal EF.  No good reason for lasix.  D/C lasix, KDUR.    3. Asthma: Patient with cough today. No wheezing. Off theophylline.  Continue prednisone 5 Continue albuterol prn.   4. Recurrent falls/ multiple bruises: d/c ASA. No history of CAD.

## 2012-07-28 ENCOUNTER — Encounter: Payer: Self-pay | Admitting: Emergency Medicine

## 2012-07-28 ENCOUNTER — Non-Acute Institutional Stay: Payer: Self-pay | Admitting: Emergency Medicine

## 2012-07-28 DIAGNOSIS — I951 Orthostatic hypotension: Secondary | ICD-10-CM

## 2012-07-28 DIAGNOSIS — J45909 Unspecified asthma, uncomplicated: Secondary | ICD-10-CM

## 2012-07-28 DIAGNOSIS — I10 Essential (primary) hypertension: Secondary | ICD-10-CM

## 2012-07-28 DIAGNOSIS — E039 Hypothyroidism, unspecified: Secondary | ICD-10-CM

## 2012-07-28 HISTORY — DX: Orthostatic hypotension: I95.1

## 2012-07-28 NOTE — Assessment & Plan Note (Signed)
Relatively well controlled on Toprol XR 100mg  daily.  Would have goal SBP 140-150 when sitting given her orthostatic hypotension.

## 2012-07-28 NOTE — Assessment & Plan Note (Addendum)
TSH low on 2/7.  Consider decreasing dose of synthroid to daily with recheck of TSH in 6 weeks.

## 2012-07-28 NOTE — Assessment & Plan Note (Signed)
Stable off theophylline.

## 2012-07-28 NOTE — Progress Notes (Signed)
  Subjective:    Patient ID: Connie Fitzpatrick, female    DOB: 1928/04/23, 79 y.o.   MRN: [id number]  HPI Connie Fitzpatrick was seen at [facility] during her physical therapy session.  She reports feeling well with no complaints.  Reports a good appetite, normal bowel movements, normal amount of ankle swelling, no pain.  When asked about her recent falls, she denies any falls.  Does report going to the hospital for dehydration and low blood pressures.  I have reviewed and updated the following as appropriate: allergies and current medications SHx: never smoker   Review of Systems See HPI    Objective:   Physical Exam BP 132/64  Pulse 55  Temp(Src) 97 F (36.1 C)  Resp 18  Wt 113 lb 6.4 oz (51.438 kg)  BMI 19.46 kg/m2 Gen: alert, cooperative, NAD HEENT: AT/[place], sclera white, pupils equal, MMM Neck: supple, no LAD CV: RRR, no murmurs Pulm: CTAB, no wheezes or rales Abd: +BS, soft, NTND Ext: 1+ edema bilaterally to mid-shin; TED hose in place Neuro: alert, oriented to person and place, no focal deficits      Assessment & Plan:

## 2012-07-28 NOTE — Assessment & Plan Note (Signed)
Orthostatics positive earlier this week.  No evidence for volume depletion. Sitting: 151/62, 85 Standing: 126/78, 105 Discussed extensively with patient.  Urged her to use caution when standing as she may get dizzy and fall.

## 2012-07-28 NOTE — Assessment & Plan Note (Signed)
Appears to be stable.  Will repeat MMSE in April after she has been on Namenda for a few months.

## 2012-08-08 ENCOUNTER — Other Ambulatory Visit: Payer: Self-pay | Admitting: Family Medicine

## 2012-08-08 ENCOUNTER — Non-Acute Institutional Stay: Payer: Self-pay | Admitting: Family Medicine

## 2012-08-08 DIAGNOSIS — E039 Hypothyroidism, unspecified: Secondary | ICD-10-CM

## 2012-08-08 DIAGNOSIS — M1990 Unspecified osteoarthritis, unspecified site: Secondary | ICD-10-CM

## 2012-08-08 DIAGNOSIS — K219 Gastro-esophageal reflux disease without esophagitis: Secondary | ICD-10-CM

## 2012-08-08 MED ORDER — LEVOTHYROXINE SODIUM 75 MCG PO TABS: 75.0000 ug | ORAL_TABLET | Freq: Every day | ORAL | Status: AC

## 2012-08-09 MED ORDER — OMEPRAZOLE 20 MG PO CPDR: 20.0000 mg | DELAYED_RELEASE_CAPSULE | Freq: Every day | ORAL | Status: DC

## 2012-08-09 MED ORDER — ACETAMINOPHEN ER 650 MG PO TBCR: 650.0000 mg | EXTENDED_RELEASE_TABLET | Freq: Three times a day (TID) | ORAL | Status: DC

## 2012-08-09 NOTE — Assessment & Plan Note (Signed)
Chronic pain, schedule acetaminophen

## 2012-08-09 NOTE — Progress Notes (Signed)
  Subjective:    Patient ID: Connie Fitzpatrick, female    DOB: 1928-05-21, 79 y.o.   MRN: [id number]  HPI complains of reflux to throat with bitter taste. Taking Ranitidine for acid.   complains of pain in the back 10/10  No recent falls noted   Review of Systems     Objective:   Physical Exam Alert, sitting in wheel chair, appears comfortable Chest Clear Cor RR without murmur Ext 1+ bilateral ankle edema, but with skin wrinkles. Skin Hydrocolloid pads on left shin in 2 places        Assessment & Plan:

## 2012-08-09 NOTE — Assessment & Plan Note (Signed)
Occuring on H2 blocker, will change to Omeprazole

## 2012-08-09 NOTE — Assessment & Plan Note (Addendum)
TSH was low during the hospitalization, but may have been down regulation during acute illness. Decreased to 75 micrograms and will check in 4-6 weeks.

## 2012-08-23 ENCOUNTER — Encounter: Payer: Self-pay | Admitting: Pharmacist

## 2012-08-25 ENCOUNTER — Encounter: Payer: Self-pay | Admitting: Pharmacist

## 2012-09-20 ENCOUNTER — Non-Acute Institutional Stay: Payer: Self-pay | Admitting: Emergency Medicine

## 2012-09-20 DIAGNOSIS — F0390 Unspecified dementia without behavioral disturbance: Secondary | ICD-10-CM

## 2012-09-20 DIAGNOSIS — I10 Essential (primary) hypertension: Secondary | ICD-10-CM

## 2012-09-20 DIAGNOSIS — E039 Hypothyroidism, unspecified: Secondary | ICD-10-CM

## 2012-09-20 DIAGNOSIS — J45909 Unspecified asthma, uncomplicated: Secondary | ICD-10-CM

## 2012-09-20 DIAGNOSIS — K219 Gastro-esophageal reflux disease without esophagitis: Secondary | ICD-10-CM

## 2012-09-20 MED ORDER — PANTOPRAZOLE SODIUM 40 MG PO TBEC: 40.0000 mg | DELAYED_RELEASE_TABLET | Freq: Every day | ORAL | Status: DC

## 2012-09-20 NOTE — Progress Notes (Addendum)
  Subjective:    Patient ID: Connie Fitzpatrick, female    DOB: July 25, 1927, 79 y.o.   MRN: [id number]  HPI Ms. Farruggia was seen in her room at [facility].  Her daughter, Connie Fitzpatrick, was present for the visit.  She reports that everything is going well except for her reflux.  She was recently switched to omeprazole and she says it works for breakfast, but by lunch and dinner she can only take 2-3 bites before she starts getting heartburn and spitting.  Denies any blood in the spit.  No blood in stool.  This makes her very reluctant to eat in the dining room and she takes all her meals in her room.  She states she had an UGI 2 years ago (daughter confirms this) that showed something in the upper stomach but not a hiatal hernia.  Per her report, the doctor told her that it may need a surgical intervention.  Connie Fitzpatrick thinks she saw Dr. Fredrich Birks at [facility].  Otherwise she has no complaints.  She is able to transfer to bed to wheelchair independently.  No falls.  Pain is under control.    Nursing staff has no concerns and denies any recent falls.    Review of Systems See HPI    Objective:   Physical Exam BP 146/65  Pulse 58  Temp(Src) 96.8 F (36 C)  Resp 20 Gen: alert, cooperative, NAD, sitting in wheelchair HEENT: AT/[place], sclera white, PERRL, MMM, no pharyngeal erythema or exudate Neck: no LAD, supple CV: RRR, no murmurs Pulm: CTAB Abd: +BS, soft, NTND Ext: warm and well perfused, trace lower extremity edema Neuro: alert     Assessment & Plan:

## 2012-09-20 NOTE — Assessment & Plan Note (Signed)
Continues to have symptoms with lunch and dinner.  I will change her to protonix 40mg  qAM.  She would like to keep a prn medication at bedside; I requested that we try the protonix first.  She was agreeable with this.  If she continues to have symptoms on protonix, it may be beneficial to have her seen by GI again.

## 2012-09-20 NOTE — Assessment & Plan Note (Signed)
Well controlled with no further falls.  Continue current medications.

## 2012-09-20 NOTE — Assessment & Plan Note (Signed)
Has been on decreased dose about 6 weeks now; will check a TSH.

## 2012-09-20 NOTE — Assessment & Plan Note (Signed)
Appears stable.  I would like to do a MMSE on, but she declined today as her daughter was visiting and her lunch had just been delivered.

## 2012-09-20 NOTE — Assessment & Plan Note (Signed)
Stable.  No prn albuterol or duoneb use in the last month.

## 2012-09-21 LAB — TSH
TSH: 1.047
TSH: 1.05 u[IU]/mL (ref <=5.90)

## 2012-09-29 ENCOUNTER — Encounter: Payer: Self-pay | Admitting: Pharmacist

## 2012-10-17 ENCOUNTER — Non-Acute Institutional Stay: Payer: Medicare Other | Admitting: Family Medicine

## 2012-10-17 ENCOUNTER — Encounter: Payer: Self-pay | Admitting: Family Medicine

## 2012-10-17 DIAGNOSIS — I10 Essential (primary) hypertension: Secondary | ICD-10-CM

## 2012-10-17 DIAGNOSIS — F0390 Unspecified dementia without behavioral disturbance: Secondary | ICD-10-CM

## 2012-10-17 DIAGNOSIS — E039 Hypothyroidism, unspecified: Secondary | ICD-10-CM

## 2012-10-17 LAB — HEPATIC FUNCTION PANEL
AST: 22 U/L (ref 13–35)
Bilirubin, Total: 0.4 mg/dL

## 2012-10-17 LAB — POCT ERYTHROCYTE SEDIMENTATION RATE, NON-AUTOMATED: Sed Rate: 20 mm

## 2012-10-17 LAB — CBC AND DIFFERENTIAL
Platelets: 331 10*3/uL (ref 150–399)
WBC: 13.9 10^3/mL

## 2012-10-17 LAB — BASIC METABOLIC PANEL: Glucose: 119 mg/dL

## 2012-10-17 NOTE — Progress Notes (Signed)
  Subjective:    Patient ID: Connie Fitzpatrick, female    DOB: 1927/06/21, 79 y.o.   MRN: [id number]  HPI Asked to see the patient due to her appearing more confused recently. Her husband was present and believes she changed a couple weeks ago. She denies any current pain or UTI symptoms including no back pain.   She does report decreased appetite. She remains on a PPI which controls her GERD symptoms.   There has been family stress in the death of an older family member and also a stroke in his wife. This had required the husband to be out of town.    Review of Systems     Objective:   Physical Exam  Constitutional: She appears well-developed and well-nourished.  Cardiovascular: Normal rate and regular rhythm.   No murmur heard. Pulmonary/Chest: Effort normal and breath sounds normal.  Abdominal: Soft. There is no tenderness. There is no guarding.  Examined sitting  Musculoskeletal: She exhibits edema.  trace ankle edema  Severe RA changes in hands.  Neurological: She is alert.  But drifted off when not being directly addressed.   Skin: Skin is warm and dry.  Psychiatric: She has a normal mood and affect.  Oriented to month and day, but not date or year. Not oriented to  place              Assessment & Plan:

## 2012-10-18 ENCOUNTER — Encounter: Payer: Self-pay | Admitting: Family Medicine

## 2012-10-18 NOTE — Assessment & Plan Note (Addendum)
Elevated on 5/3 but previously well controlled. blood pressure 118/71 on 08/20/12 Repeat vitals ordered

## 2012-10-18 NOTE — Assessment & Plan Note (Addendum)
More confused for the last several weeks per husband.  Tried to elope per the staff. Only recent medication change was giving Tramadol scheduled so will try stopping that and see what happens with her musculoskeletal pain. If no improvement, will try decreasing Cymbalta No fever or UTI symptoms to suggest need for urinalysis Will do CBC, CMET, ESR looking for signs of infection or RA flare

## 2012-11-21 ENCOUNTER — Telehealth: Payer: Self-pay | Admitting: Family Medicine

## 2012-11-21 NOTE — Telephone Encounter (Signed)
RN calling to see what shoulder needs to be X-rayed because this is not specified in the chart. Not documented in Epic. RN able to look in EMR at [facility] to see of complaint of right shoulder pain, so I instructed to obtain images of right shoulder.

## 2012-11-21 NOTE — Telephone Encounter (Signed)
RN calling to let me know that DG right shoulder demonstrates persistent anterior superior right humor head dislocation consistent with previous X-ray in Sept 2013. No action required at this time.

## 2012-11-22 ENCOUNTER — Non-Acute Institutional Stay: Payer: Self-pay | Admitting: Family Medicine

## 2012-11-22 DIAGNOSIS — M25511 Pain in right shoulder: Secondary | ICD-10-CM

## 2012-11-22 NOTE — Assessment & Plan Note (Signed)
R shoulder pain after fall from wheelchair recently.  Some bruising around shoulder.  She does have some movement so I don't think it is dislocated.  Will get shoulder x-ray to eval for fracture.  She may possibly have rotator cuff tear given decreased strength and limitation in motion.  Could consider MRI or ortho referral depending on how aggressive family would want to be with treatment.  She reports her pain is well controlled and does not want anything for pain at this time.

## 2012-11-22 NOTE — Progress Notes (Signed)
  Subjective:    Patient ID: Connie Fitzpatrick, female    DOB: 08/18/1927, 79 y.o.   MRN: [id number]  HPI  C/o R shoulder pain with movement after falling from her wheelchair over the weekend.  Has some burising around the shoulder.  She is able to move some but quite painful.  Review of Systems     Objective:   Physical Exam Gen: In wheelchair, nad Ext: r shoulder limited in all planes of motio.  Difficult to reach out to shake my had.  Compared to L strength is also diminished.  Sensation intact bilaterally.  Radial pulse 2+ bialterally.  Some bony tenderness along acromion       Assessment & Plan:

## 2012-11-23 ENCOUNTER — Encounter (HOSPITAL_COMMUNITY): Payer: Self-pay

## 2012-11-23 ENCOUNTER — Emergency Department (HOSPITAL_COMMUNITY): Payer: Medicare Other

## 2012-11-23 ENCOUNTER — Emergency Department (HOSPITAL_COMMUNITY)
Admission: EM | Admit: 2012-11-23 | Discharge: 2012-11-23 | Disposition: A | Discharge status: Home / Self Care | Payer: Medicare Other | Attending: Emergency Medicine | Admitting: Emergency Medicine

## 2012-11-23 DIAGNOSIS — S81009A Unspecified open wound, unspecified knee, initial encounter: Secondary | ICD-10-CM | POA: Insufficient documentation

## 2012-11-23 DIAGNOSIS — F329 Major depressive disorder, single episode, unspecified: Secondary | ICD-10-CM | POA: Insufficient documentation

## 2012-11-23 DIAGNOSIS — E039 Hypothyroidism, unspecified: Secondary | ICD-10-CM | POA: Insufficient documentation

## 2012-11-23 DIAGNOSIS — F3289 Other specified depressive episodes: Secondary | ICD-10-CM | POA: Insufficient documentation

## 2012-11-23 DIAGNOSIS — Z79899 Other long term (current) drug therapy: Secondary | ICD-10-CM | POA: Insufficient documentation

## 2012-11-23 DIAGNOSIS — Z8679 Personal history of other diseases of the circulatory system: Secondary | ICD-10-CM | POA: Insufficient documentation

## 2012-11-23 DIAGNOSIS — S0180XA Unspecified open wound of other part of head, initial encounter: Secondary | ICD-10-CM | POA: Insufficient documentation

## 2012-11-23 DIAGNOSIS — S0181XA Laceration without foreign body of other part of head, initial encounter: Secondary | ICD-10-CM

## 2012-11-23 DIAGNOSIS — IMO0002 Reserved for concepts with insufficient information to code with codable children: Secondary | ICD-10-CM | POA: Insufficient documentation

## 2012-11-23 DIAGNOSIS — Y92129 Unspecified place in nursing home as the place of occurrence of the external cause: Secondary | ICD-10-CM

## 2012-11-23 DIAGNOSIS — M4802 Spinal stenosis, cervical region: Secondary | ICD-10-CM | POA: Insufficient documentation

## 2012-11-23 DIAGNOSIS — Z8639 Personal history of other endocrine, nutritional and metabolic disease: Secondary | ICD-10-CM | POA: Insufficient documentation

## 2012-11-23 DIAGNOSIS — Y9389 Activity, other specified: Secondary | ICD-10-CM | POA: Insufficient documentation

## 2012-11-23 DIAGNOSIS — F0390 Unspecified dementia without behavioral disturbance: Secondary | ICD-10-CM | POA: Insufficient documentation

## 2012-11-23 DIAGNOSIS — E785 Hyperlipidemia, unspecified: Secondary | ICD-10-CM | POA: Insufficient documentation

## 2012-11-23 DIAGNOSIS — I10 Essential (primary) hypertension: Secondary | ICD-10-CM | POA: Insufficient documentation

## 2012-11-23 DIAGNOSIS — W06XXXA Fall from bed, initial encounter: Secondary | ICD-10-CM | POA: Insufficient documentation

## 2012-11-23 DIAGNOSIS — S022XXA Fracture of nasal bones, initial encounter for closed fracture: Secondary | ICD-10-CM | POA: Insufficient documentation

## 2012-11-23 DIAGNOSIS — S81812A Laceration without foreign body, left lower leg, initial encounter: Secondary | ICD-10-CM

## 2012-11-23 DIAGNOSIS — Z862 Personal history of diseases of the blood and blood-forming organs and certain disorders involving the immune mechanism: Secondary | ICD-10-CM | POA: Insufficient documentation

## 2012-11-23 DIAGNOSIS — J45909 Unspecified asthma, uncomplicated: Secondary | ICD-10-CM | POA: Insufficient documentation

## 2012-11-23 DIAGNOSIS — K219 Gastro-esophageal reflux disease without esophagitis: Secondary | ICD-10-CM | POA: Insufficient documentation

## 2012-11-23 DIAGNOSIS — Z8739 Personal history of other diseases of the musculoskeletal system and connective tissue: Secondary | ICD-10-CM | POA: Insufficient documentation

## 2012-11-23 DIAGNOSIS — Y9210 Unspecified residential institution as the place of occurrence of the external cause: Secondary | ICD-10-CM | POA: Insufficient documentation

## 2012-11-23 DIAGNOSIS — G4700 Insomnia, unspecified: Secondary | ICD-10-CM | POA: Insufficient documentation

## 2012-11-23 DIAGNOSIS — Z8719 Personal history of other diseases of the digestive system: Secondary | ICD-10-CM | POA: Insufficient documentation

## 2012-11-23 NOTE — ED Provider Notes (Signed)
History    CSN: [id number] Arrival date & time 11/23/12  9528  First MD Initiated Contact with Patient 11/23/12 [phone]     Chief Complaint  Patient presents with  . Fall   (Consider location/radiation/quality/duration/timing/severity/associated sxs/prior Treatment) HPI 79 year old female presents emergency department via EMS.  Her nursing facility after being found on the floor.  Patient reports she fell when she got out of bed.  Patient was found facedown on the floor.  She is noted to have a laceration to her forehead and dried blood from her nose.  Patient complained of right shoulder pain to EMS in route.  Patient reports she fell on Sunday, was evaluated by "paramedic" and told she had shoulder dislocation.  Record review shows the family practice resident saw the patient, she has a chronic right shoulder dislocation.  Patient has a good range of motion of the shoulder.  At this time, reports she feels better after transport by EMS.  Patient has baseline dementia, seems to be at her baseline.  She has no other complaints at this time.  Past Medical History  Diagnosis Date  . Gout   . INSOMNIA   . ALLERGIC RHINITIS   . ASTHMA   . OSTEOARTHRITIS   . Rheumatoid arthritis(714.0)   . HYPERTENSION   . HYPERLIPIDEMIA   . GERD (gastroesophageal reflux disease)   . Depression   . ALCOHOL ABUSE     hx detox, dry since 2010  . HYPOTHYROIDISM   . URINARY INCONTINENCE   . Dementia   . Recurrent falls   . SDH (subdural hematoma)     a. 2008 in setting of fall.  . Chronic pain   . Perforated small intestine     a. From swallowed foreign body - s/p exlap with small bowel resection 09/2011.   Past Surgical History  Procedure Laterality Date  . Abdominal hysterectomy    . Breast surgery    . Total knee arthroplasty    . Vaginal prolapse repair    . Total knee arthroplasty  07/2002    right  . Total knee arthroplasty  09/1998    Left  . Cataract extraction  2001  . L-spine  1998  .  Colpocleisis vaginal le fort  06/2010  . Laparotomy  10/26/2011    Procedure: EXPLORATORY LAPAROTOMY;  Surgeon: Wilmon Arms. Corliss Skains, MD;  Location: [facility] OR;  Service: General;  Laterality: N/A;  . Bowel resection  10/26/2011    Procedure: SMALL BOWEL RESECTION;  Surgeon: Wilmon Arms. Corliss Skains, MD;  Location: [facility] OR;  Service: General;  Laterality: N/A;   Family History  Problem Relation Age of Onset  . Arthritis Mother   . Diabetes Mother   . Hypertension Mother   . Lung cancer Father   . Stroke Father    History  Substance Use Topics  . Smoking status: Never Smoker   . Smokeless tobacco: Not on file  . Alcohol Use: No     Comment: Previous hx of alcohol abuse   OB History   Grav Para Term Preterm Abortions TAB SAB Ect Mult Living                 Review of Systems  Unable to perform ROS: Dementia    Allergies  Allopurinol; Azithromycin; Enalapril maleate; Morphine and related; Nsaids; and Valacyclovir hcl  Home Medications   Current Outpatient Rx  Name  Route  Sig  Dispense  Refill  . acetaminophen (TYLENOL) 650 MG CR tablet  Oral   Take 1 tablet (650 mg total) by mouth every 8 (eight) hours.         . ALPRAZolam (XANAX) 0.5 MG tablet   Oral   Take 0.5 mg by mouth 2 (two) times daily. Take 0.5 mg in the morning and in the evening         . azelastine (ASTELIN) 137 MCG/SPRAY nasal spray   Nasal   Place 2 sprays into the nose 2 (two) times daily. Use in each nostril as directed         . bisacodyl (DULCOLAX) 5 MG EC tablet   Oral   Take 5 mg by mouth at bedtime.         . calcium citrate (CALCITRATE - DOSED IN MG ELEMENTAL CALCIUM) 950 MG tablet   Oral   Take 2 tablets by mouth daily.         . colchicine 0.6 MG tablet   Oral   Take 0.6 mg by mouth daily with breakfast.         . donepezil (ARICEPT) 10 MG tablet   Oral   Take 10 mg by mouth daily with breakfast.         . febuxostat (ULORIC) 40 MG tablet   Oral   Take 40 mg by mouth daily with  breakfast.         . gabapentin (NEURONTIN) 300 MG capsule   Oral   Take 300 mg by mouth 2 (two) times daily.         Marland Kitchen levothyroxine (SYNTHROID, LEVOTHROID) 75 MCG tablet   Oral   Take 1 tablet (75 mcg total) by mouth daily with breakfast.   30 tablet   0   . loratadine (CLARITIN) 10 MG tablet   Oral   Take 10 mg by mouth daily.         . memantine (NAMENDA) 10 MG tablet   Oral   Take 1 tablet (10 mg total) by mouth daily.         . mometasone-formoterol (DULERA) 100-5 MCG/ACT AERO   Inhalation   Inhale 2 puffs into the lungs 2 (two) times daily.         . multivitamin-lutein (OCUVITE-LUTEIN) CAPS   Oral   Take 1 capsule by mouth daily with breakfast.         . predniSONE (DELTASONE) 5 MG tablet   Oral   Take 5 mg by mouth daily.         . Vitamin D, Ergocalciferol, (DRISDOL) 50000 UNITS CAPS   Oral   Take 50,000 Units by mouth every 30 (thirty) days.         . vitamin E 400 UNIT capsule   Oral   Take 400 Units by mouth every morning.          Marland Kitchen albuterol (PROVENTIL HFA;VENTOLIN HFA) 108 (90 BASE) MCG/ACT inhaler   Inhalation   Inhale 2 puffs into the lungs every 4 (four) hours as needed. For shortness of breath         . Alum & Mag Hydroxide-Simeth (MAGIC MOUTHWASH) SOLN   Oral   Take 5 mLs by mouth at bedtime.         . DULoxetine (CYMBALTA) 30 MG capsule   Oral   Take 60 mg by mouth at bedtime.          Marland Kitchen ipratropium-albuterol (DUONEB) 0.5-2.5 (3) MG/3ML SOLN   Nebulization   Take 3 mLs by nebulization every 6 (six)  hours as needed. For shortness of breath         . Melatonin 5 MG CAPS   Oral   Take 5 mg by mouth at bedtime.          . metoprolol succinate (TOPROL-XL) 100 MG 24 hr tablet   Oral   Take 100 mg by mouth at bedtime. Take with or immediately following a meal.         . Multiple Vitamin (MULITIVITAMIN WITH MINERALS) TABS   Oral   Take 1 tablet by mouth at bedtime.         . prednisoLONE acetate (PRED  FORTE) 1 % ophthalmic suspension   Both Eyes   Place 1 drop into both eyes 3 (three) times daily.         . traMADol (ULTRAM) 50 MG tablet   Oral   Take 50 mg by mouth every 8 (eight) hours.          BP 183/84  Pulse 56  Temp(Src) 97.1 F (36.2 C) (Oral)  Resp 18  SpO2 98% Physical Exam  Nursing note and vitals reviewed. Constitutional: She appears well-developed and well-nourished. No distress.  HENT:  Head: Normocephalic.  Right Ear: External ear normal.  Left Ear: External ear normal.  Mouth/Throat: Oropharynx is clear and moist. No oropharyngeal exudate.  Dried blood in bilateral nares.  2 cm laceration to left forehead.  Minor abrasion to right temple.  Bruising and swelling to bridge of nose, mild crepitus no acute deformity  Eyes: Conjunctivae and EOM are normal. Pupils are equal, round, and reactive to light.  Neck: Normal range of motion. Neck supple. No tracheal deviation present.  Pt immobilized on backboard with ccollar and blocks in place.  With inline immobilization, pt was rolled from the long spine board and back was palpated inspecting for pain and step off/crepitus.  None were noted   Cardiovascular: Normal rate, regular rhythm, normal heart sounds and intact distal pulses.  Exam reveals no gallop and no friction rub.   No murmur heard. Pulmonary/Chest: Effort normal and breath sounds normal. No stridor. No respiratory distress. She has no wheezes. She has no rales. She exhibits no tenderness.  Abdominal: Soft. Bowel sounds are normal. She exhibits no distension and no mass. There is no tenderness. There is no rebound and no guarding.  Musculoskeletal: Normal range of motion.  5 cm skin tear to left leg  Lymphadenopathy:    She has no cervical adenopathy.  Neurological: She is alert. She exhibits normal muscle tone. Coordination normal.  Skin: Skin is warm and dry. No rash noted. No erythema. No pallor.    ED Course  Procedures (including critical care  time) Labs Reviewed - No data to display LACERATION REPAIR Performed by: Olivia Mackie Authorized by: Olivia Mackie Consent: Verbal consent obtained. Risks and benefits: risks, benefits and alternatives were discussed Consent given by: patient Patient identity confirmed: provided demographic data Prepped and Draped in normal sterile fashion Wound explored  Laceration Location:left forehead   Laceration Length: 2cm  No Foreign Bodies seen or palpated  Anesthesia: none   Irrigation method: syringe Amount of cleaning: standard  Skin closure:dermabond   Patient tolerance: Patient tolerated the procedure well with no immediate complications.    Dg Shoulder Right  11/23/2012   *RADIOLOGY REPORT*  Clinical Data: Shoulder pain status post fall.  History of dislocation.  RIGHT SHOULDER - 2+ VIEW  Comparison: Radiographs 07/16/2008 and CT 05/06/2010.  Findings: The patient has severe underlying  glenohumeral arthropathy with marked erosion of the scapular glenoid, acromion and distal clavicle.  In addition, the distal clavicle has a distal sharp margin which may be secondary to prior resection.  There is progressive anterior and superior dislocation of the humeral head with respect to the glenoid.  The subacromial space is obliterated consistent with a chronic rotator cuff tear.  IMPRESSION: Progressive severe glenohumeral arthropathy consistent with rheumatoid arthritis.  There is chronic anterior dislocation of the glenohumeral joint.   Original Report Authenticated By: Carey Bullocks, M.D.   Ct Head Wo Contrast  11/23/2012   *RADIOLOGY REPORT*  Clinical Data:  Head injury status post fall.  CT HEAD WITHOUT CONTRAST CT MAXILLOFACIAL WITHOUT CONTRAST CT CERVICAL SPINE WITHOUT CONTRAST  Technique:  Multidetector CT imaging of the head, cervical spine, and maxillofacial structures were performed using the standard protocol without intravenous contrast. Multiplanar CT image reconstructions of  the cervical spine and maxillofacial structures were also generated.  Comparison:  PET CT 07/07/2012.  Cervical spine CT 05/25/2012.  CT HEAD  Findings: There is no evidence of acute intracranial hemorrhage, mass lesion, brain edema or extra-axial fluid collection. Confluent low density in the periventricular white matter is consistent with chronic small vessel ischemic change.  There is new low density within the left thalamus and basal ganglia consistent with interval small vessel infarction.  No cortical based infarct is identified.  Mild generalized atrophy appears stable.  There is soft tissue swelling in the left frontal scalp without evidence of underlying calvarial fracture.  Facial injuries are described below.  IMPRESSION:  1.  No acute intracranial findings identified. 2.  Interval small vessel infarction within the left basal ganglia and thalamus.  Otherwise stable chronic small vessel ischemic changes. 3.  Frontal scalp laceration.  CT MAXILLOFACIAL  Findings:  There are acute to moderately displaced bilateral nasal bone fractures.  No other acute facial fractures are identified. Chronic erosion of the right mandibular condyle is noted.  There is no evidence of orbital hematoma.  The globes are intact. Frontal scalp soft tissue swelling is present.  IMPRESSION: Acute mildly displaced nasal bone fractures bilaterally.  No evidence of orbital hematoma.  CT CERVICAL SPINE  Findings:   The cervical alignment stable.  There is a chronic degenerative anterolisthesis at C2-C3.  In addition, there is chronic extensive pannus surrounding the odontoid process with associated osseous erosion.  There is slight widening of the predental space asymmetric to the left.  The C1 lamina are displaced anteriorly, narrowing the AP diameter of the canal to approximately 5 mm and likely causing proximal cervical cord compression.  This has mildly progressed compared with the prior study.  Uncinate spurring and facet disease  in the lower cervical spine are stable.  There is no evidence of acute fracture or traumatic subluxation.  No acute soft tissue findings are identified. Dilatation of the proximal thoracic esophagus appears stable.  IMPRESSION:  1.  No evidence of acute cervical spine fracture, traumatic subluxation or static signs instability. 2.  Progressive erosion of the odontoid process with prominent surrounding pannus and increased anterior atlantoaxial subluxation. There is resulting central stenosis and proximal cervical cord compression.   Original Report Authenticated By: Carey Bullocks, M.D.   Ct Cervical Spine Wo Contrast  11/23/2012   *RADIOLOGY REPORT*  Clinical Data:  Head injury status post fall.  CT HEAD WITHOUT CONTRAST CT MAXILLOFACIAL WITHOUT CONTRAST CT CERVICAL SPINE WITHOUT CONTRAST  Technique:  Multidetector CT imaging of the head, cervical spine, and maxillofacial structures  were performed using the standard protocol without intravenous contrast. Multiplanar CT image reconstructions of the cervical spine and maxillofacial structures were also generated.  Comparison:  PET CT 07/07/2012.  Cervical spine CT 05/25/2012.  CT HEAD  Findings: There is no evidence of acute intracranial hemorrhage, mass lesion, brain edema or extra-axial fluid collection. Confluent low density in the periventricular white matter is consistent with chronic small vessel ischemic change.  There is new low density within the left thalamus and basal ganglia consistent with interval small vessel infarction.  No cortical based infarct is identified.  Mild generalized atrophy appears stable.  There is soft tissue swelling in the left frontal scalp without evidence of underlying calvarial fracture.  Facial injuries are described below.  IMPRESSION:  1.  No acute intracranial findings identified. 2.  Interval small vessel infarction within the left basal ganglia and thalamus.  Otherwise stable chronic small vessel ischemic changes. 3.   Frontal scalp laceration.  CT MAXILLOFACIAL  Findings:  There are acute to moderately displaced bilateral nasal bone fractures.  No other acute facial fractures are identified. Chronic erosion of the right mandibular condyle is noted.  There is no evidence of orbital hematoma.  The globes are intact. Frontal scalp soft tissue swelling is present.  IMPRESSION: Acute mildly displaced nasal bone fractures bilaterally.  No evidence of orbital hematoma.  CT CERVICAL SPINE  Findings:   The cervical alignment stable.  There is a chronic degenerative anterolisthesis at C2-C3.  In addition, there is chronic extensive pannus surrounding the odontoid process with associated osseous erosion.  There is slight widening of the predental space asymmetric to the left.  The C1 lamina are displaced anteriorly, narrowing the AP diameter of the canal to approximately 5 mm and likely causing proximal cervical cord compression.  This has mildly progressed compared with the prior study.  Uncinate spurring and facet disease in the lower cervical spine are stable.  There is no evidence of acute fracture or traumatic subluxation.  No acute soft tissue findings are identified. Dilatation of the proximal thoracic esophagus appears stable.  IMPRESSION:  1.  No evidence of acute cervical spine fracture, traumatic subluxation or static signs instability. 2.  Progressive erosion of the odontoid process with prominent surrounding pannus and increased anterior atlantoaxial subluxation. There is resulting central stenosis and proximal cervical cord compression.   Original Report Authenticated By: Carey Bullocks, M.D.   Ct Maxillofacial Wo Cm  11/23/2012   *RADIOLOGY REPORT*  Clinical Data:  Head injury status post fall.  CT HEAD WITHOUT CONTRAST CT MAXILLOFACIAL WITHOUT CONTRAST CT CERVICAL SPINE WITHOUT CONTRAST  Technique:  Multidetector CT imaging of the head, cervical spine, and maxillofacial structures were performed using the standard  protocol without intravenous contrast. Multiplanar CT image reconstructions of the cervical spine and maxillofacial structures were also generated.  Comparison:  PET CT 07/07/2012.  Cervical spine CT 05/25/2012.  CT HEAD  Findings: There is no evidence of acute intracranial hemorrhage, mass lesion, brain edema or extra-axial fluid collection. Confluent low density in the periventricular white matter is consistent with chronic small vessel ischemic change.  There is new low density within the left thalamus and basal ganglia consistent with interval small vessel infarction.  No cortical based infarct is identified.  Mild generalized atrophy appears stable.  There is soft tissue swelling in the left frontal scalp without evidence of underlying calvarial fracture.  Facial injuries are described below.  IMPRESSION:  1.  No acute intracranial findings identified. 2.  Interval small  vessel infarction within the left basal ganglia and thalamus.  Otherwise stable chronic small vessel ischemic changes. 3.  Frontal scalp laceration.  CT MAXILLOFACIAL  Findings:  There are acute to moderately displaced bilateral nasal bone fractures.  No other acute facial fractures are identified. Chronic erosion of the right mandibular condyle is noted.  There is no evidence of orbital hematoma.  The globes are intact. Frontal scalp soft tissue swelling is present.  IMPRESSION: Acute mildly displaced nasal bone fractures bilaterally.  No evidence of orbital hematoma.  CT CERVICAL SPINE  Findings:   The cervical alignment stable.  There is a chronic degenerative anterolisthesis at C2-C3.  In addition, there is chronic extensive pannus surrounding the odontoid process with associated osseous erosion.  There is slight widening of the predental space asymmetric to the left.  The C1 lamina are displaced anteriorly, narrowing the AP diameter of the canal to approximately 5 mm and likely causing proximal cervical cord compression.  This has mildly  progressed compared with the prior study.  Uncinate spurring and facet disease in the lower cervical spine are stable.  There is no evidence of acute fracture or traumatic subluxation.  No acute soft tissue findings are identified. Dilatation of the proximal thoracic esophagus appears stable.  IMPRESSION:  1.  No evidence of acute cervical spine fracture, traumatic subluxation or static signs instability. 2.  Progressive erosion of the odontoid process with prominent surrounding pannus and increased anterior atlantoaxial subluxation. There is resulting central stenosis and proximal cervical cord compression.   Original Report Authenticated By: Carey Bullocks, M.D.   1. Fall at nursing home, initial encounter   2. Cervical stenosis of spinal canal   3. Nasal bones, closed fracture, initial encounter   4. Forehead laceration, initial encounter   5. Skin tear of left lower leg without complication     MDM  79 year old female status post fall.  We'll get CT scans of head, face, C-spine.  Feel that her laceration should be able to be repaired with Dermabond.  10:17 AM Wounds repaired.  CT scan with worsening cervical spinal stenosis, cord compression.  No signs of weakness, sensation loss on exam.  Pt has been seen in the past by [facility], will refer her back to see him.  Nasal bone fractures noted, will refer to ENT.  Olivia Mackie, MD 11/23/12 1018

## 2012-11-23 NOTE — ED Notes (Addendum)
Pt. Was found this am on the floor by the staff.  Pt. Is from [facility] and it appeared she fell when she got out of bed.  She was lying face down.  Pt is alert and orientedX3. Does have periods of confusion, normal.  Answers questions appropriately.  Pt. Has a laceration above her lt. Eye and also a dressing noted to her lt. Shin , no bleeding noted.  Pt. Denies LOC but did hit her head. Pt. Arrived on a LSB and C-collar

## 2012-11-23 NOTE — ED Notes (Signed)
Myself and Lawson Fiscal, RN performed wound care for pt's skin tear on her left leg (shin area); gently pulled skin as far over as possible to close tear, applied vaseline gauze, applied 4 sterile 4x4s and then wrapped with gauze and taped

## 2012-11-23 NOTE — ED Notes (Signed)
P-tar notifed of transport back to [place]

## 2012-11-23 NOTE — ED Notes (Signed)
Family at bedside; pt awaiting PTAR for transport back to her facility

## 2012-11-23 NOTE — ED Notes (Signed)
Report called to [facility], Inetta Fermo, RN

## 2012-11-23 NOTE — ED Notes (Signed)
Pt. Also has a dislocated rt. Shoulder that is chronic.

## 2012-11-24 ENCOUNTER — Non-Acute Institutional Stay: Payer: Self-pay | Admitting: Emergency Medicine

## 2012-11-24 ENCOUNTER — Encounter: Payer: Self-pay | Admitting: Emergency Medicine

## 2012-11-24 DIAGNOSIS — K219 Gastro-esophageal reflux disease without esophagitis: Secondary | ICD-10-CM

## 2012-11-24 DIAGNOSIS — I951 Orthostatic hypotension: Secondary | ICD-10-CM

## 2012-11-24 DIAGNOSIS — E039 Hypothyroidism, unspecified: Secondary | ICD-10-CM

## 2012-11-24 DIAGNOSIS — I10 Essential (primary) hypertension: Secondary | ICD-10-CM

## 2012-11-24 DIAGNOSIS — F0150 Vascular dementia without behavioral disturbance: Secondary | ICD-10-CM

## 2012-11-24 NOTE — Assessment & Plan Note (Signed)
Appears stable.  I do not appreciate any worsening confusion today; however, no family was at bedside to provide additional input.  Labs from 5/19 were unremarkable except for a mildly elevated white count of 13.9 which may be related to chronic steroid use.

## 2012-11-24 NOTE — Assessment & Plan Note (Signed)
Slightly elevated, but with history of falls and orthostatic hypotension, will allow BP to run a little high.

## 2012-11-24 NOTE — Assessment & Plan Note (Signed)
Fall on 6/25 likely caused by orthostatic hypotension.  Will allow BP to run a little high in an effort to prevent additional falls.  This will be difficult as the patient has trouble remember that she has had multiple falls.

## 2012-11-24 NOTE — Assessment & Plan Note (Signed)
TSH wnl on synthroid daily.

## 2012-11-24 NOTE — Assessment & Plan Note (Signed)
Much improved on protonix.  Continue current medications.

## 2012-11-24 NOTE — Progress Notes (Signed)
  Subjective:    Patient ID: Connie Fitzpatrick, female    DOB: [birth year], 79 y.o.   MRN: [id number]  HPI Connie Fitzpatrick was seen in her room at [facility].  She reports doing okay.  Feeling very sore in her back and chest after a fall yesterday.  She reports falling yesterday morning after getting out of bed.  She does not remember getting light headed and states that she got out of bed slowly.  Denies any headaches or changes in her vision.  Reports that her heartburn is much better on the protonix.  Her husband had reported worsened confusion last month.  Per chart review, her tramadol was changed to prn.  Connie Fitzpatrick does not appear more confused than previous times I have seen her.  She reports getting tramadol, but this medication was not listed on her MAR.   I have reviewed and updated the following as appropriate: allergies and current medications SHx: non smoker  Review of Systems See HPI    Objective:   Physical Exam BP 154/73  Pulse 80  Temp(Src) 97 F (36.1 C)  Resp 18  Wt 112 lb 3.2 oz (50.894 kg)  BMI 19.25 kg/m2 Gen: alert, cooperative, NAD HEENT: AT/[place], sclera white, Pupils 1mm bilaterally, MMM, laceration on left forehead, bruising around both eyes (worse on the L) with bruising and some swelling over the bridge of her nose Neck: supple CV: RRR, no murmurs Pulm: CTAB, no wheezes or rales Abd: +BS, soft, NTND Ext: some swelling in left lower extremity which has bandage in place; chronic RA changes in hands     Assessment & Plan:

## 2012-12-01 ENCOUNTER — Other Ambulatory Visit: Payer: Self-pay | Admitting: Family Medicine

## 2012-12-01 DIAGNOSIS — F0391 Unspecified dementia with behavioral disturbance: Secondary | ICD-10-CM

## 2012-12-01 MED ORDER — MEMANTINE HCL 10 MG PO TABS: 10.0000 mg | ORAL_TABLET | Freq: Two times a day (BID) | ORAL | Status: DC

## 2012-12-07 ENCOUNTER — Other Ambulatory Visit: Payer: Self-pay | Admitting: Family Medicine

## 2012-12-07 ENCOUNTER — Other Ambulatory Visit: Payer: Self-pay | Admitting: Geriatric Medicine

## 2012-12-07 MED ORDER — ALPRAZOLAM 0.5 MG PO TABS: 0.5000 mg | ORAL_TABLET | Freq: Two times a day (BID) | ORAL | Status: DC | PRN

## 2012-12-15 ENCOUNTER — Telehealth: Payer: Self-pay | Admitting: Family Medicine

## 2012-12-15 NOTE — Telephone Encounter (Signed)
Called patient's daughter: Theron Arista to touch base about her request of re-starting xanax scheduled in the morning. She is concerned that her behavior has changed today and that she may need it to be scheduled since she can't ask for it with her dementia. She found her to be more confused and more tired than usual today. She received xanax at daughter's request today at 1:30pm I let her know that when I saw Ms. Hanneman this afernoon, she appeared drowsy but arousable, was not oriented to person, time and place. She thought it was 1950 and that she was at the swimming pool.  I explained that the reason we had written for xanax to be prn is because there is an associated risk of fall with it and that since Ms. Kazanjian can't walk, getting out of her wheelchair and loosing balance can result in falls. She understood this.   She has also been refusing medications in the evening: cymbalta, metoprolol, synthroid, gabapentin. Her daughter states that if someone she doesn't know gives her this medicine, she will refuse it.   Will continue to monitor patient. Vitals to be obtained today. Patient's daughter expressed understanding and agreed with plan.

## 2012-12-21 ENCOUNTER — Non-Acute Institutional Stay: Payer: Medicare Other | Admitting: Family Medicine

## 2012-12-21 ENCOUNTER — Encounter: Payer: Self-pay | Admitting: Family Medicine

## 2012-12-21 DIAGNOSIS — T148XXA Other injury of unspecified body region, initial encounter: Secondary | ICD-10-CM | POA: Insufficient documentation

## 2012-12-21 DIAGNOSIS — E039 Hypothyroidism, unspecified: Secondary | ICD-10-CM

## 2012-12-21 DIAGNOSIS — IMO0002 Reserved for concepts with insufficient information to code with codable children: Secondary | ICD-10-CM

## 2012-12-21 DIAGNOSIS — F329 Major depressive disorder, single episode, unspecified: Secondary | ICD-10-CM

## 2012-12-21 DIAGNOSIS — F3289 Other specified depressive episodes: Secondary | ICD-10-CM

## 2012-12-21 DIAGNOSIS — I10 Essential (primary) hypertension: Secondary | ICD-10-CM

## 2012-12-21 NOTE — Assessment & Plan Note (Signed)
Stable on current medication regimen of Cymbalta

## 2012-12-21 NOTE — Progress Notes (Signed)
Subjective:     Patient ID: Ommie Degeorge, female   DOB: 05-Feb-1928, 79 y.o.   MRN: [id number]  HPI  79 y/o female seen in [facility]. Patient has no new acute issues. Healing well s/p fall a few weeks ago, no acute pain, tolerating diet, having regular BM's, no recent falls, she previously had left shoulder pain s/p fall that is improving   Review of Systems  Constitutional: Negative for chills and appetite change.  Respiratory: Negative for cough and chest tightness.   Cardiovascular: Negative for chest pain.  Gastrointestinal: Negative for nausea, vomiting, abdominal pain and diarrhea.       Objective:   Physical Exam  Constitutional: She appears well-developed.  HENT:  Head: Normocephalic.  Mouth/Throat: Oropharynx is clear and moist and mucous membranes are normal. No posterior oropharyngeal erythema.  Eyes: Conjunctivae and EOM are normal.  Cardiovascular: Normal rate, regular rhythm, S1 normal and S2 normal.   Pulmonary/Chest: Effort normal and breath sounds normal.  Abdominal: Soft. Normal appearance. There is tenderness.  Musculoskeletal:       Left shoulder: She exhibits decreased range of motion.  Neurological: She is alert.  Oriented to self, unable to identify the date or location   Skin: left shin-eyrthema/abrasion present, was covered with colloid dressing and Kerlex, no discharge, no warmth; left elbow-abrasion present, no acute signs of infection, covered with Tegaderm     Assessment:         Plan:     Patient was seen and evaluated with Resident Marena Chancy

## 2012-12-21 NOTE — Assessment & Plan Note (Signed)
Diastolic BP slightly elevated, will continue to monitor at this time, will avoid aggressive treatment of HTN as she is at risk for falls, will continue current dose of Metoprolol

## 2012-12-21 NOTE — Assessment & Plan Note (Signed)
Patient currently oriented to self only, will continue to treat with Donepezil and Namenda, monitor clinically, may consider repeat MMSE if her dementia appears to be worsening

## 2012-12-21 NOTE — Assessment & Plan Note (Signed)
Stable, most resent TSH in 09/2012 was wnl, continue current dose of synthroid

## 2013-01-03 ENCOUNTER — Encounter: Payer: Self-pay | Admitting: Internal Medicine

## 2013-01-17 ENCOUNTER — Encounter: Payer: Self-pay | Admitting: Pharmacist

## 2013-01-17 NOTE — Progress Notes (Signed)
Patient ID: Connie Fitzpatrick, female   DOB: [birth year], 79 y.o.   MRN: [id number] Reviewed medication list from nursing home.

## 2013-01-24 ENCOUNTER — Non-Acute Institutional Stay (INDEPENDENT_AMBULATORY_CARE_PROVIDER_SITE_OTHER): Payer: Medicare Other | Admitting: Emergency Medicine

## 2013-01-24 ENCOUNTER — Encounter: Payer: Self-pay | Admitting: Emergency Medicine

## 2013-01-24 DIAGNOSIS — E039 Hypothyroidism, unspecified: Secondary | ICD-10-CM

## 2013-01-24 DIAGNOSIS — K219 Gastro-esophageal reflux disease without esophagitis: Secondary | ICD-10-CM

## 2013-01-24 DIAGNOSIS — I10 Essential (primary) hypertension: Secondary | ICD-10-CM

## 2013-01-24 DIAGNOSIS — F0150 Vascular dementia without behavioral disturbance: Secondary | ICD-10-CM

## 2013-01-24 DIAGNOSIS — R634 Abnormal weight loss: Secondary | ICD-10-CM

## 2013-01-24 NOTE — Assessment & Plan Note (Signed)
Well controlled. Continue metoprolol XL. Err on letting BP run high given history or orthostatic hypotension and falls.

## 2013-01-24 NOTE — Assessment & Plan Note (Addendum)
Continue aricept and namenda. Will repeat MMSE in October.

## 2013-01-24 NOTE — Assessment & Plan Note (Signed)
Improved.  Denies any symptoms.

## 2013-01-24 NOTE — Assessment & Plan Note (Signed)
Weight down to 105lbs this visit. She reports good appetite, but does not recall how much of her meals she eats. Wrote for a daily ensure. Could consider remeron for appetite stimulation, but patient did not want to take another medicine.

## 2013-01-24 NOTE — Progress Notes (Signed)
  Subjective:    Patient ID: Connie Fitzpatrick, female    DOB: May 28, 1928, 79 y.o.   MRN: [id number]  HPI Connie Fitzpatrick was seen in her room at [facility]  She reports feeling well and does not have any complaints.  She reports a good appetite, but does not know how much of her meals she eats.  Denies heartburn, nausea, vomiting, abdominal, pain, diarrhea, constipation.  No chest pain or shortness of breath.  She states, "I think I'm doing pretty well."  Does have difficulty remembering her history of falls.  She denies any dizziness, even when transferring from bed to chair.  I have reviewed and updated the following as appropriate: allergies, current medications, past family history, past medical history, past social history, past surgical history and problem list PMHx: CVA, vascular dementia, HTN, orthostatic hypotension, hypothyroidism, RA  SHx: non smoker  Review of Systems See HPI    Objective:   Physical Exam BP 124/68  Pulse 88  Temp(Src) 97 F (36.1 C)  Resp 18  Wt 105 lb (47.628 kg)  BMI 18.01 kg/m2 Gen: alert, cooperative, NAD, sitting comfortably in wheelchair HEENT: AT/[place], sclera white, MMM Neck: supple CV: RRR, no murmurs Pulm: CTAB, no wheezes or rales Abd: +BS, soft, NTND Ext: trace edema, 2+ DP pulses bilaterally     Assessment & Plan:

## 2013-01-24 NOTE — Assessment & Plan Note (Signed)
Stable. Recheck TSH yearly or if there are issues.

## 2013-02-17 ENCOUNTER — Encounter: Payer: Self-pay | Admitting: Pharmacist

## 2013-03-17 ENCOUNTER — Non-Acute Institutional Stay (INDEPENDENT_AMBULATORY_CARE_PROVIDER_SITE_OTHER): Payer: Medicare Other | Admitting: Emergency Medicine

## 2013-03-17 ENCOUNTER — Encounter: Payer: Self-pay | Admitting: Emergency Medicine

## 2013-03-17 DIAGNOSIS — I10 Essential (primary) hypertension: Secondary | ICD-10-CM

## 2013-03-17 DIAGNOSIS — M069 Rheumatoid arthritis, unspecified: Secondary | ICD-10-CM

## 2013-03-17 DIAGNOSIS — R634 Abnormal weight loss: Secondary | ICD-10-CM

## 2013-03-17 DIAGNOSIS — F0150 Vascular dementia without behavioral disturbance: Secondary | ICD-10-CM

## 2013-03-17 NOTE — Progress Notes (Signed)
  Subjective:    Patient ID: Connie Fitzpatrick, female    DOB: 08-Feb-1928, 79 y.o.   MRN: [id number]  HPI Connie Fitzpatrick was seen in her room at [facility].  She denies any specific pain, trouble breathing, difficulty with eating, difficulty with urination or bowel movements.  She is answering most questions today with "I don't really know."  Reports that she started feeling "spaced out" this morning.  RN reports that the CNA reported that she was trying to get out of bed this morning on her own without calling for assistance, but otherwise no acute changes in behavior.  She had not received her medications yet at the time of my evaluation.  I have reviewed and updated the following as appropriate: allergies and current medications SHx: non smoker  Review of Systems See HPI    Objective:   Physical Exam BP 136/68  Pulse 58  Temp(Src) 96.9 F (36.1 C)  Resp 18  Wt 104 lb 12.8 oz (47.537 kg)  BMI 17.98 kg/m2 Gen: alert, cooperative, NAD HEENT: AT/[place], sclera white, MMM Neck: supple CV: RRR, no murmurs Pulm: CTAB, no wheezes or rales Ext: trace edema; stable hand contractures      Assessment & Plan:

## 2013-03-17 NOTE — Assessment & Plan Note (Signed)
Stable contractures. No complaints of pain today. Continue tylenol.

## 2013-03-17 NOTE — Assessment & Plan Note (Signed)
Well controlled. No report of new falls. Continue metoprolol.

## 2013-03-17 NOTE — Assessment & Plan Note (Signed)
Weight is stable at around 105lbs. Continue with daily Ensure supplement.

## 2013-03-17 NOTE — Assessment & Plan Note (Signed)
MMSE is declining.  She had not received her medications yet this morning and reported feeling "spaced out." I am not sure how acute this change is, although staff deny and rapid changes in her mental status. Continue Namenda and aricept. This may just be progression of her dementia. No focal neurologic deficits to suggest new stroke.

## 2013-03-22 ENCOUNTER — Non-Acute Institutional Stay: Payer: Medicare Other | Admitting: Family Medicine

## 2013-03-22 DIAGNOSIS — F0150 Vascular dementia without behavioral disturbance: Secondary | ICD-10-CM

## 2013-03-22 DIAGNOSIS — R634 Abnormal weight loss: Secondary | ICD-10-CM

## 2013-03-22 DIAGNOSIS — T148XXA Other injury of unspecified body region, initial encounter: Secondary | ICD-10-CM

## 2013-03-22 DIAGNOSIS — IMO0002 Reserved for concepts with insufficient information to code with codable children: Secondary | ICD-10-CM

## 2013-03-22 DIAGNOSIS — E039 Hypothyroidism, unspecified: Secondary | ICD-10-CM

## 2013-03-28 NOTE — Assessment & Plan Note (Signed)
A: decline. MMSE 16. Patient still eating well, pleasant and judgment remains intact as evidence by no recent falls. P: continue aricept and namenda.

## 2013-03-28 NOTE — Assessment & Plan Note (Signed)
Stable over the past two months. Will continue to monitor.

## 2013-03-28 NOTE — Progress Notes (Signed)
Geri Attending Note  Subjective:    Patient ID: Connie Fitzpatrick, female    DOB: [birth year], 79 y.o.   MRN: [id number]  HPI Patient seen for routine follow up.   Acute events: none.    Review of Systems Admits: thin skin and easy bruising/skin tearing  Denies CP, HA, SOB, GI upset.     Objective: BP 145/82  Pulse 55  Temp(Src) 97 F (36.1 C)  Resp 20  Wt 104 lb 12.8 oz (47.537 kg)  BMI 17.98 kg/m2   Physical Exam  Nursing note and vitals reviewed. Constitutional: She appears well-developed and well-nourished. No distress.  HENT:  Head: Normocephalic and atraumatic.  Cardiovascular: Normal rate and regular rhythm.   Pulmonary/Chest: Effort normal and breath sounds normal.  Abdominal: Soft. Bowel sounds are normal.  Skin: Abrasion and ecchymosis noted.  Multiple anterior shin abrasions some with areas of bleeding.    Lab Results  Component Value Date   TSH 1.05 09/21/2012   TSH 1.047 09/21/2012       Assessment & Plan:

## 2013-03-28 NOTE — Assessment & Plan Note (Signed)
Left and right anterior shin abrasion, no evidence of infection at this time, continue dressings as needed

## 2013-03-28 NOTE — Assessment & Plan Note (Signed)
Repeating TSH and CMP due to weight loss patient may need a dose adjustment.

## 2013-03-29 ENCOUNTER — Encounter: Payer: Self-pay | Admitting: Pharmacist

## 2013-04-24 ENCOUNTER — Encounter: Payer: Self-pay | Admitting: Pharmacist

## 2013-05-10 ENCOUNTER — Non-Acute Institutional Stay: Payer: Medicare Other | Admitting: Family Medicine

## 2013-05-10 DIAGNOSIS — Y9210 Unspecified residential institution as the place of occurrence of the external cause: Secondary | ICD-10-CM

## 2013-05-10 DIAGNOSIS — I10 Essential (primary) hypertension: Secondary | ICD-10-CM

## 2013-05-10 DIAGNOSIS — Z789 Other specified health status: Secondary | ICD-10-CM | POA: Insufficient documentation

## 2013-05-10 DIAGNOSIS — M069 Rheumatoid arthritis, unspecified: Secondary | ICD-10-CM

## 2013-05-10 DIAGNOSIS — F0150 Vascular dementia without behavioral disturbance: Secondary | ICD-10-CM

## 2013-05-10 DIAGNOSIS — Z593 Problems related to living in residential institution: Secondary | ICD-10-CM | POA: Insufficient documentation

## 2013-05-10 NOTE — Assessment & Plan Note (Signed)
Dementia appears stable at this time however would benefit from regular Mini-Mental Status examinations by her PCP to evaluate effectiveness of Aricept and Namenda. -Continue Aricept and Namenda at this time

## 2013-05-10 NOTE — Assessment & Plan Note (Signed)
Per most recent recommendation from physical therapy patient could be discharged from the nursing home as long as she has 24-hour supervision and assistance with her ADLs. -PCP and social work to meet with family to discuss possible transfer to home

## 2013-05-10 NOTE — Progress Notes (Signed)
   Subjective:    Patient ID: Connie Fitzpatrick, female    DOB: 02/16/1928, 79 y.o.   MRN: [id number]  HPI 79 year old female with past medical history of hypertension, hyperlipidemia, hypothyroidism, rheumatoid arthritis, vascular dementia seen and evaluated at [facility] nursing home on 05/10/2013.  Patient is without acute complaint. She does not complaining of any pain. She is currently eating her morning breakfast, denies difficulty with eating, no, pain, no nausea, no emesis, normal bowel movement  She is a history of vascular dementia with last MMSE on 03/14/2013 of 16/28. This was performed by her primary care physician.  She has a history of rheumatoid arthritis and is on prednisone 5 mg daily. She reports that her pain is well-controlled at this time however she does have significant deformity of her hands and wrists from this disease.  She had recent evaluation by physical therapy and social work who are recommending that patient can return home as long as she has 24-hour supervision and assistance with her ADLs.   Review of Systems  Constitutional: Negative for chills and fatigue.  Respiratory: Negative for cough, shortness of breath and wheezing.   Cardiovascular: Negative for chest pain and leg swelling.  Gastrointestinal: Negative for nausea, vomiting, abdominal pain, diarrhea and abdominal distention.  Genitourinary: Negative for dysuria.  Musculoskeletal: Positive for joint swelling.       Objective:   Physical Exam Vitals: Reviewed General: Pleasant Caucasian female, no acute distress HEENT: Normocephalic, pupils are equal in size, no scleral icterus, moist mucous members, neck was supple, no anterior posterior cervical lymphadenopathy Cardiac: Regular rate and rhythm, S1 and S2 present, no murmurs, no heaves or thrills Respiratory: Clear to auscultation bilaterally, normal effort Abdomen: Soft, nontender, normal bowel sounds, no rebound, no guarding Extremities: Trace  bilateral lower extremity edema, 2+ radial pulses MSK: Significant bony deformity and ulnar deviation of bilateral hands consistent with history of severe rheumatoid arthritis       Assessment & Plan:  Please see problem specific assessment and plan.

## 2013-05-10 NOTE — Assessment & Plan Note (Signed)
Well-controlled on metoprolol. No pharmacotherapy changes made today

## 2013-05-10 NOTE — Assessment & Plan Note (Signed)
Stable RA with significant bony deformity of the bilateral hands and wrists. -Continue prednisone daily

## 2013-05-11 ENCOUNTER — Encounter: Payer: Self-pay | Admitting: Emergency Medicine

## 2013-05-11 ENCOUNTER — Non-Acute Institutional Stay (INDEPENDENT_AMBULATORY_CARE_PROVIDER_SITE_OTHER): Payer: Medicare Other | Admitting: Emergency Medicine

## 2013-05-11 DIAGNOSIS — R634 Abnormal weight loss: Secondary | ICD-10-CM

## 2013-05-11 DIAGNOSIS — M069 Rheumatoid arthritis, unspecified: Secondary | ICD-10-CM

## 2013-05-11 DIAGNOSIS — F329 Major depressive disorder, single episode, unspecified: Secondary | ICD-10-CM

## 2013-05-11 DIAGNOSIS — J45909 Unspecified asthma, uncomplicated: Secondary | ICD-10-CM

## 2013-05-11 DIAGNOSIS — I10 Essential (primary) hypertension: Secondary | ICD-10-CM

## 2013-05-11 DIAGNOSIS — Z593 Problems related to living in residential institution: Secondary | ICD-10-CM

## 2013-05-11 DIAGNOSIS — F0150 Vascular dementia without behavioral disturbance: Secondary | ICD-10-CM

## 2013-05-11 DIAGNOSIS — Y9210 Unspecified residential institution as the place of occurrence of the external cause: Secondary | ICD-10-CM

## 2013-05-11 DIAGNOSIS — F3289 Other specified depressive episodes: Secondary | ICD-10-CM

## 2013-05-11 DIAGNOSIS — Z789 Other specified health status: Secondary | ICD-10-CM

## 2013-05-11 DIAGNOSIS — E039 Hypothyroidism, unspecified: Secondary | ICD-10-CM

## 2013-05-11 NOTE — Assessment & Plan Note (Signed)
Well controlled. Continue metoprolol.  Will check BMP.

## 2013-05-11 NOTE — Assessment & Plan Note (Signed)
Recall 1/3. Continue on aricept and namenda.

## 2013-05-11 NOTE — Assessment & Plan Note (Signed)
Resolved. Weight stable to slightly improved at 102.2lbs.

## 2013-05-11 NOTE — Assessment & Plan Note (Signed)
Stable. Continue cymbalta.  

## 2013-05-11 NOTE — Assessment & Plan Note (Signed)
Stable contractures. Continue prednisone.

## 2013-05-11 NOTE — Assessment & Plan Note (Signed)
Has not required any prn albuterol for at least 2 months. Will d/c dulera. Start QVAR 2 puffs BID. Continue prn albuterol.

## 2013-05-11 NOTE — Progress Notes (Signed)
   Subjective:    Patient ID: Connie Fitzpatrick, female    DOB: 1928-05-28, 79 y.o.   MRN: [id number]  HPI Dianne Whelchel is seen in the nursing for routine f/u.  She states that she is doing well.  Reports eating very well and is worried about gaining too much weight.  Denies any nausea, vomiting, diarrhea, trouble with urination.  She also reports that her breathing has been fine.  She continues to take [facility] twice a day.  She has not needed any prns for at least 2 months.  Her husband had asked about taking her home.  Per PT/OT, she needs 24 hour assistance.  She requires minimal assistance with ADLs, but does need help with transfers.  She states that she is not sure about going home as she thinks her husband has been having some health problems.  While she would like to go home, she does not want to be a burden.  I have reviewed and updated the following as appropriate: allergies, current medications, past family history, past medical history, past social history, past surgical history and problem list PMHx: RA, OA, HTN, HLD, dementia, asthma SHx: non smoker  Health Maintenance: colonoscopy in 2011; mammogram due 11/2013  Review of Systems See HPI, otherwise negative    Objective:   Physical Exam There were no vitals taken for this visit. Gen: alert, cooperative, NAD, sitting in wheelchair HEENT: AT/[place], sclera white, MMM Neck: supple, no LAD, no bruits CV: RRR, no murmurs Pulm: CTAB, no wheezes or rales Abd: +BS, soft, NTND Ext: trace edema, 2+ DP pulses bilaterally Neuro: orient to person and place only; 1/3 on recall; no focal deficits       Assessment & Plan:

## 2013-05-11 NOTE — Assessment & Plan Note (Signed)
Continue current dose of synthroid 

## 2013-05-11 NOTE — Assessment & Plan Note (Signed)
Discussing possible d/c home with husband. Would need 24 hours supervision and assistance. SW aware and a meeting between PCP, SW, pt and husband is in the works.

## 2013-05-16 LAB — BASIC METABOLIC PANEL: Glucose: 86 mg/dL

## 2013-05-18 ENCOUNTER — Encounter: Payer: Self-pay | Admitting: Emergency Medicine

## 2013-06-05 IMAGING — CT CT ABD-PELV W/ CM
2 of 6 series · 17 of 46 positions shown, 19 images · IV contrast (agent unspecified)
Comparison: 12/24/2009

CLINICAL DATA: Severe diffuse abdominal pain, abdominal distension,
rectal bleeding

CT ABDOMEN AND PELVIS WITH CONTRAST
TECHNIQUE: Multidetector CT imaging of the abdomen and pelvis was
performed following the standard protocol during bolus
administration of intravenous contrast.
Contrast: 100 ml Cmnipaque-IQQ IV

[Series 2: rtn ap with st · axial · 0.70mm/px · z∈[-350,-26]mm · 14 of 77 slices shown, 16 images]
[im 6/77  soft-tissue]
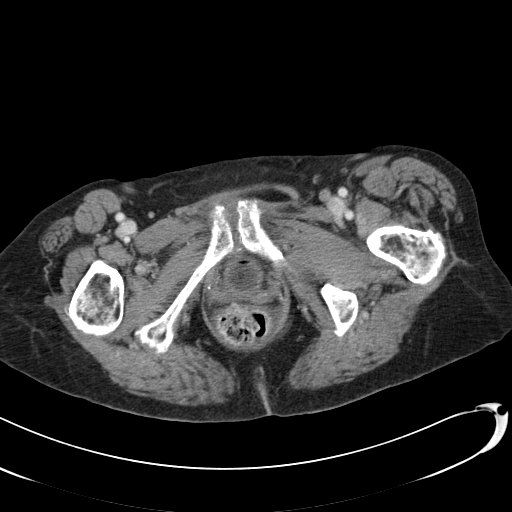
[im 6/77  bone]
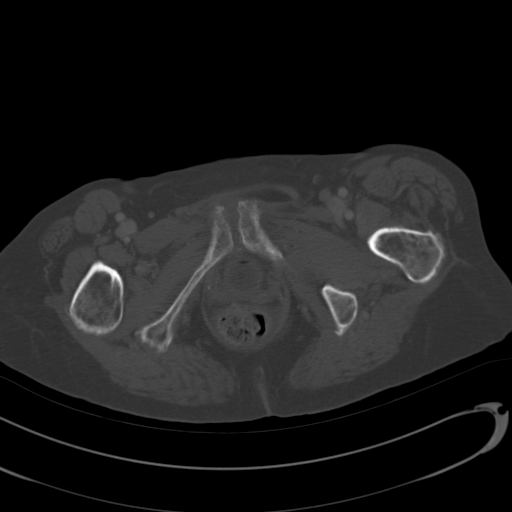
[im 11/77  soft-tissue]
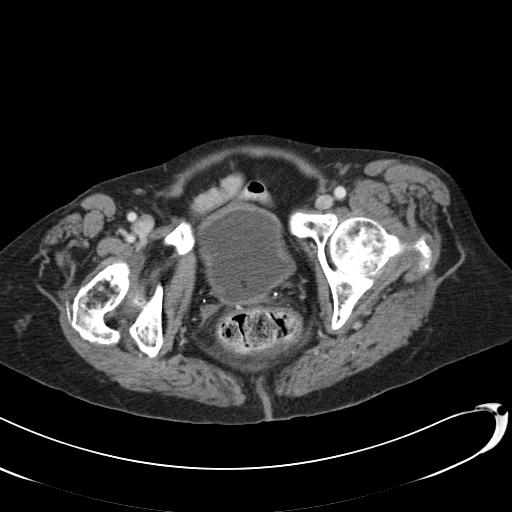
[im 16/77  soft-tissue]
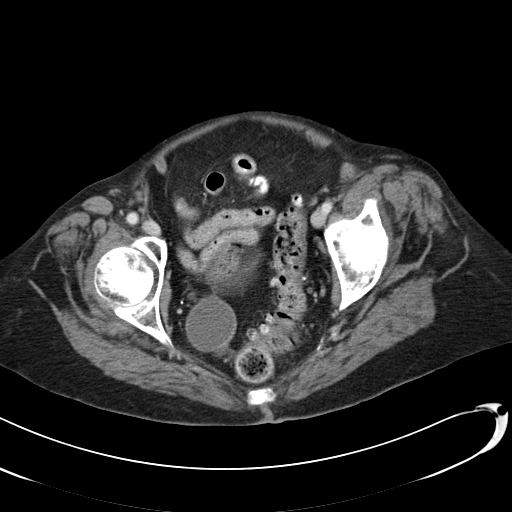
[im 21/77  soft-tissue]
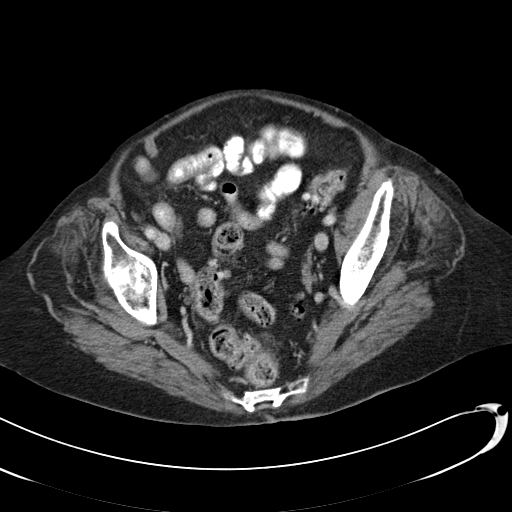
[im 26/77  soft-tissue]
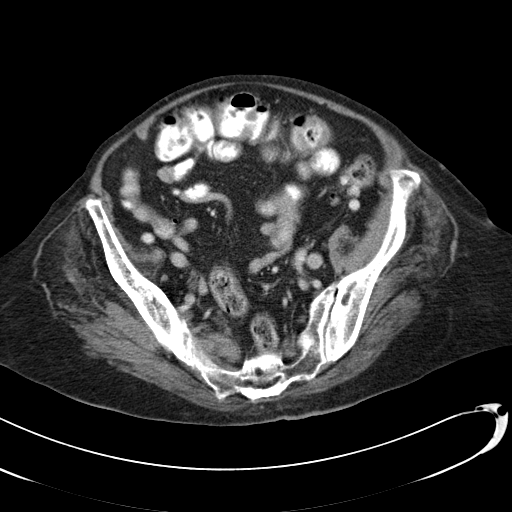
[im 31/77  soft-tissue]
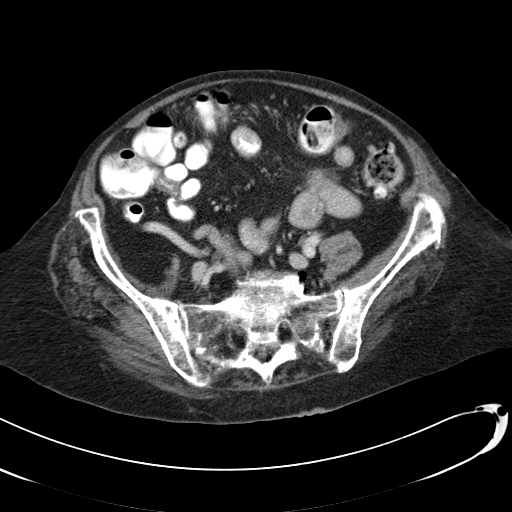
[im 36/77  soft-tissue]
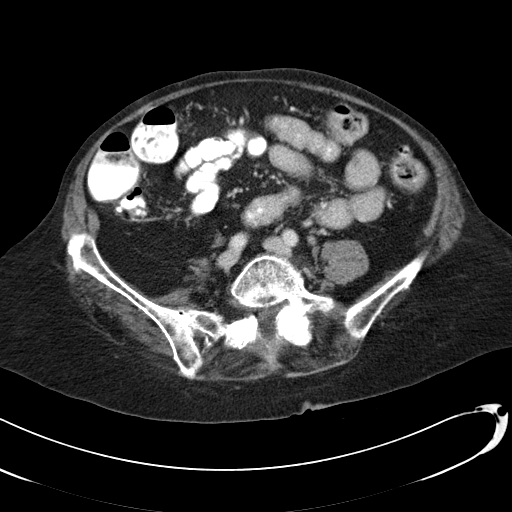
[im 41/77  soft-tissue]
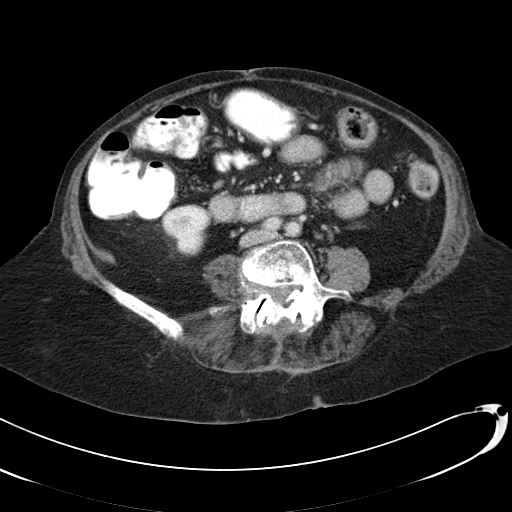
[im 46/77  soft-tissue]
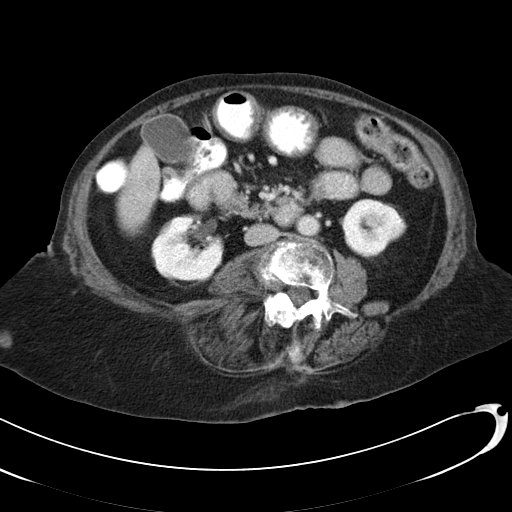
[im 46/77  bone]
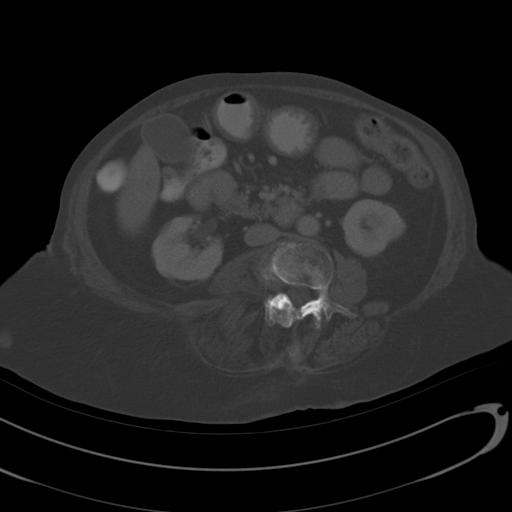
[im 51/77  soft-tissue]
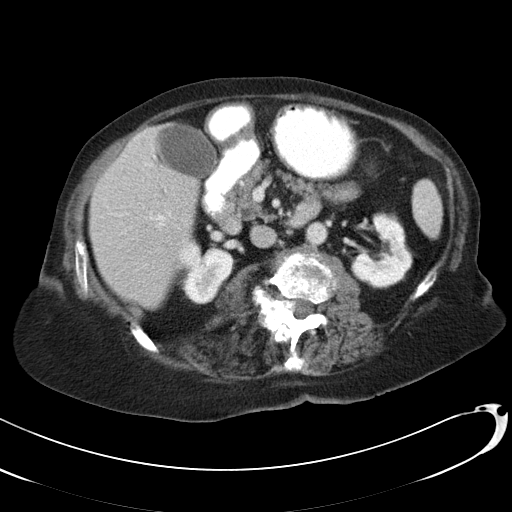
[im 56/77  soft-tissue]
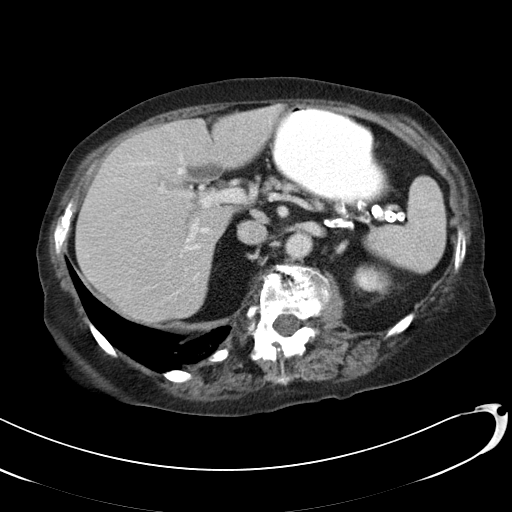
[im 61/77  soft-tissue]
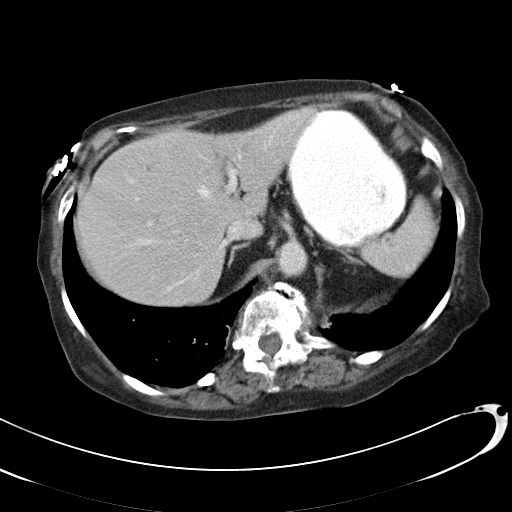
[im 66/77  soft-tissue]
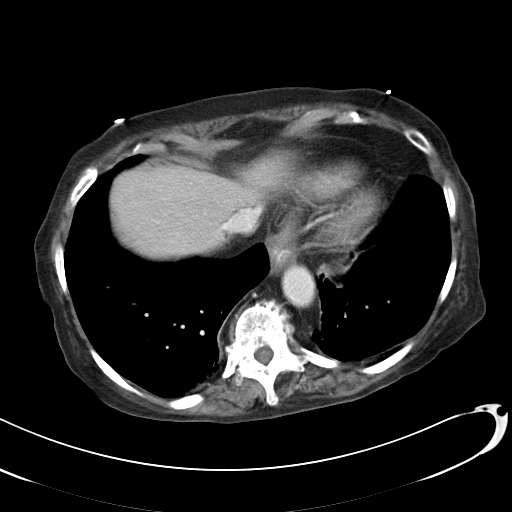
[im 71/77  soft-tissue]
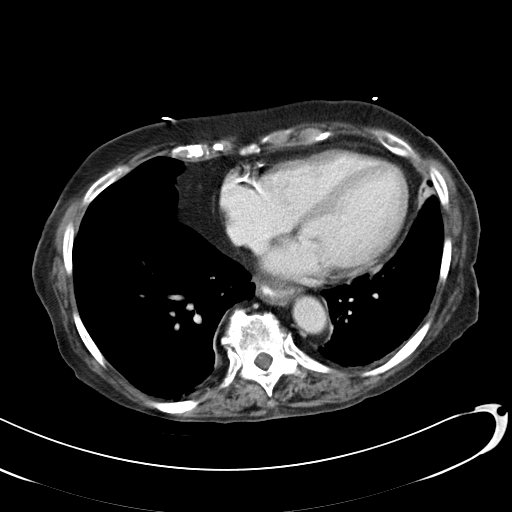

[Series 602: coronal images · coronal · 0.78mm/px · 3 of 79 slices shown]
[im 27/79  soft-tissue]
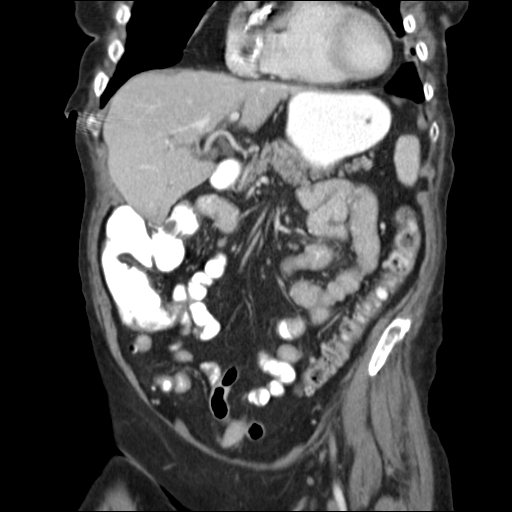
[im 35/79  soft-tissue]
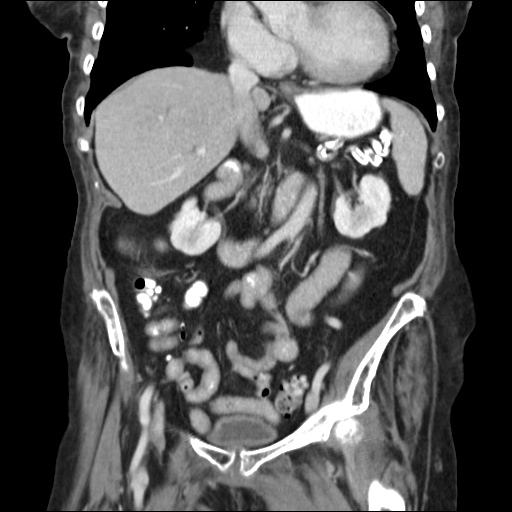
[im 44/79  soft-tissue]
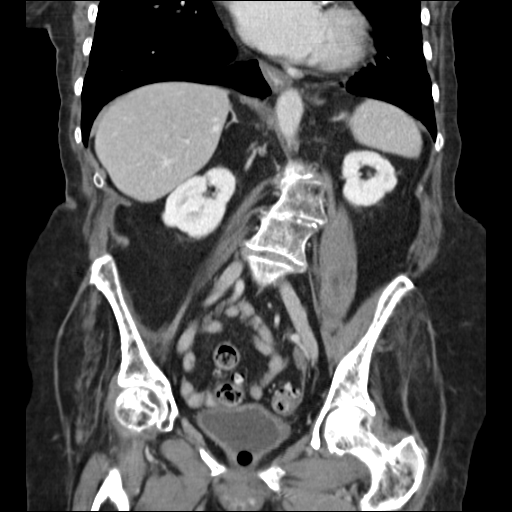

[17 of 46 positions shown; findings below may reference images not displayed]

FINDINGS: Minimal patchy opacity in the bilateral lung bases.

Oral contrast within the distal esophagus, raising the possibility
of gastroesophageal reflux or esophageal dysmotility.

Liver, spleen, pancreas, and adrenal glands are within normal
limits.

Gallbladder is unremarkable.  No intrahepatic or extrahepatic
ductal dilatation.

Kidneys are unremarkable.  No hydronephrosis.

No evidence of bowel obstruction.  Normal appendix.  Colonic
diverticulosis, without associated inflammatory changes.

No abdominopelvic ascites.  No suspicious abdominopelvic
lymphadenopathy.

Status post hysterectomy.  3.9 x 3.5 cm right adnexal cystic lesion
measuring just higher than simple fluid density (series 2/image
62), unchanged from 3441.

Bladder is notable for an indwelling Foley catheter.

Degenerative changes with scoliosis of the visualized thoracolumbar
spine.
IMPRESSION: No evidence of bowel obstruction.

Normal appendix.

Oral contrast within the distal esophagus, raising the possibility
of gastroesophageal reflux or esophageal dysmotility.

3.9 cm right adnexal cystic lesion, unchanged from 3441.

## 2013-07-11 NOTE — Progress Notes (Signed)
  Geri Attending Progress Note  Subjective:    Patient ID: Connie Fitzpatrick, female    DOB: 08-28-1927, 80 y.o.   MRN: [id number]  HPI 80 yo F NH patient seen for routine f/u:  Patient with reported fall w/o injury in the past week. Evaluated by Roland Earl resident. No acute complaints.  #RA: continues on prednisone 5 mg daily. Denies joint pain. Has significant deformity. ambulates via wheelchair. Full assist for transfers.   # chronic prednisone therapy: at higher risk for osteopetrosis. Lower risk of fracture given wheelchair bound. Although fractures with falls is still a consideration. Patient with history of GERD. On Vit D and calcium supplementation.  #Dementia: no behavioral concerns from staff. Patient talkative during exam. Patient states multiple times that she does not remember much. Her weight remains stable.   #Med review: EPIC MAR matches HL MAR except for daily ensure and analgesic cream on HL MAR (added to EPIC MAR). Patient taking gabapentin. No documented neuropathy.   #gabapentin use: patient reports tingling in her feet when she first gets to wheelchair for the first 30 mins or so. Denies symptoms now. Symptoms are moderate. Denies hand symptoms.    Review of Systems As per HPI     Objective:   Physical Exam BP 142/80  Pulse 82  Temp(Src) 98.2 F (36.8 C)  Wt 103 lb (46.72 kg) Wt Readings from Last 3 Encounters:  07/10/13 103 lb (46.72 kg)  03/08/13 104 lb 12.8 oz (47.537 kg)  03/11/13 104 lb 12.8 oz (47.537 kg)  General appearance: alert, cooperative and no distress Throat: lips, mucosa, and tongue normal; teeth and gums normal Lungs: clear to auscultation bilaterally Heart: S1S2, RRR Abdomen: soft, non-tender; bowel sounds normal; no masses,  no organomegaly Extremities:  UE: b/l wrist nodules, ulnar deviation of hands, boutonnieres deformities of fingers  HU:DJSH high TED hose on, no edema, 2 + DP pulses b/l.      Assessment & Plan:

## 2013-07-12 ENCOUNTER — Encounter: Payer: Self-pay | Admitting: Family Medicine

## 2013-07-12 ENCOUNTER — Non-Acute Institutional Stay: Payer: Medicare Other | Admitting: Family Medicine

## 2013-07-12 DIAGNOSIS — G609 Hereditary and idiopathic neuropathy, unspecified: Secondary | ICD-10-CM

## 2013-07-12 DIAGNOSIS — R634 Abnormal weight loss: Secondary | ICD-10-CM

## 2013-07-12 DIAGNOSIS — M069 Rheumatoid arthritis, unspecified: Secondary | ICD-10-CM

## 2013-07-12 DIAGNOSIS — J45909 Unspecified asthma, uncomplicated: Secondary | ICD-10-CM

## 2013-07-12 DIAGNOSIS — F0150 Vascular dementia without behavioral disturbance: Secondary | ICD-10-CM

## 2013-07-12 DIAGNOSIS — R269 Unspecified abnormalities of gait and mobility: Secondary | ICD-10-CM

## 2013-07-12 DIAGNOSIS — G5793 Unspecified mononeuropathy of bilateral lower limbs: Secondary | ICD-10-CM | POA: Insufficient documentation

## 2013-07-12 NOTE — Assessment & Plan Note (Signed)
A: no acute flares on chronic prednisone therapy. No use of prn albuterol.  P: Titrate down on QVAR 80 mvg MDI  to 1 puff BID

## 2013-07-12 NOTE — Assessment & Plan Note (Signed)
A: stable RA with joint deformities. Wheelchair bound. No acute inflammation.  P:  Continue prednisone 5 mg daily

## 2013-07-12 NOTE — Assessment & Plan Note (Signed)
A: patient now wheelchair bound. Has some symptoms consistent with neuropathy controlled with gapabentin P: continue low dose gabapentin

## 2013-07-12 NOTE — Assessment & Plan Note (Signed)
Weight stable Will resolve problem.

## 2013-07-12 NOTE — Assessment & Plan Note (Signed)
A: mild neuropathy P: Continue low dose gabapentin

## 2013-07-12 NOTE — Assessment & Plan Note (Signed)
A: progressive dementia. Patient still alert, eating, able to identify strangers. P: continue Aricept and namenda

## 2013-07-19 ENCOUNTER — Encounter (HOSPITAL_COMMUNITY): Payer: Self-pay | Admitting: Pharmacist

## 2013-07-29 ENCOUNTER — Non-Acute Institutional Stay (INDEPENDENT_AMBULATORY_CARE_PROVIDER_SITE_OTHER): Payer: Medicare Other | Admitting: Emergency Medicine

## 2013-07-29 ENCOUNTER — Encounter: Payer: Self-pay | Admitting: Emergency Medicine

## 2013-07-29 DIAGNOSIS — E039 Hypothyroidism, unspecified: Secondary | ICD-10-CM

## 2013-07-29 DIAGNOSIS — F0150 Vascular dementia without behavioral disturbance: Secondary | ICD-10-CM

## 2013-07-29 DIAGNOSIS — I10 Essential (primary) hypertension: Secondary | ICD-10-CM

## 2013-07-29 DIAGNOSIS — J45909 Unspecified asthma, uncomplicated: Secondary | ICD-10-CM

## 2013-07-29 NOTE — Assessment & Plan Note (Signed)
TSH due in April.

## 2013-07-29 NOTE — Assessment & Plan Note (Signed)
Well controlled.  Goal for her is around 140/80. If pressures remain in the 110s/70s, would recommended decreasing metoprolol to 50mg  daily.

## 2013-07-29 NOTE — Progress Notes (Signed)
Subjective:    Patient ID: Connie Fitzpatrick, female    DOB: Nov 02, 1927, 80 y.o.   MRN: [id number]  HPI Connie Fitzpatrick was seen in her room at [facility]. Her daughter, Connie Fitzpatrick, was present for the visit as well.  She reports doing well over all.  She did have a fall on Thursday.  She reports jumping out of bed to use the restroom and she hit her head on the wall and cut her left knee. She denies any LOC.  Left knee pain is slowly improving.  Denies any current headache.    She states her breathing is doing well.  No shortness of breath, cough or chest pains.  She and her daughter would like for her to have an anxiety pill available.  She has had one previously and used it very rarely.  Current Outpatient Prescriptions on File Prior to Visit  Medication Sig Dispense Refill  . acetaminophen (TYLENOL) 650 MG CR tablet Take 1 tablet (650 mg total) by mouth every 8 (eight) hours.      Marland Kitchen albuterol (PROVENTIL HFA;VENTOLIN HFA) 108 (90 BASE) MCG/ACT inhaler Inhale 2 puffs into the lungs every 4 (four) hours as needed. For shortness of breath      . Alum & Mag Hydroxide-Simeth (MAGIC MOUTHWASH) SOLN Take 5 mLs by mouth at bedtime.      . Amino Acids-Protein Hydrolys (FEEDING SUPPLEMENT, PRO-STAT SUGAR FREE 64,) LIQD Take 30 mLs by mouth daily.      . beclomethasone (QVAR) 80 MCG/ACT inhaler Inhale 2 puffs into the lungs 2 (two) times daily.      . bisacodyl (DULCOLAX) 5 MG EC tablet Take 5 mg by mouth at bedtime.      . calcium citrate (CALCITRATE - DOSED IN MG ELEMENTAL CALCIUM) 950 MG tablet Take 400 mg of elemental calcium by mouth daily.      Marland Kitchen donepezil (ARICEPT) 10 MG tablet Take 10 mg by mouth daily with breakfast.      . DULoxetine (CYMBALTA) 30 MG capsule Take 60 mg by mouth at bedtime.       . ENSURE (ENSURE) Take 237 mLs by mouth daily.      . febuxostat (ULORIC) 40 MG tablet Take 40 mg by mouth daily with breakfast.      . gabapentin (NEURONTIN) 300 MG capsule Take 300 mg by mouth 2 (two) times  daily.      Marland Kitchen levothyroxine (SYNTHROID, LEVOTHROID) 75 MCG tablet Take 1 tablet (75 mcg total) by mouth daily with breakfast.  30 tablet  0  . loratadine (CLARITIN) 10 MG tablet Take 10 mg by mouth daily.      . Melatonin 5 MG CAPS Take 5 mg by mouth at bedtime.       . memantine (NAMENDA) 10 MG tablet Take 10 mg by mouth daily.      . metoprolol succinate (TOPROL-XL) 100 MG 24 hr tablet Take 100 mg by mouth at bedtime. Take with or immediately following a meal.      . Multiple Vitamin (MULITIVITAMIN WITH MINERALS) TABS Take 1 tablet by mouth at bedtime.      . multivitamin-lutein (OCUVITE-LUTEIN) CAPS Take 1 capsule by mouth daily with breakfast.      . predniSONE (DELTASONE) 5 MG tablet Take 5 mg by mouth daily.      Marland Kitchen trolamine salicylate (ASPERCREME) 10 % cream Apply 1 application topically as needed for muscle pain.      . Vitamin D, Ergocalciferol, (DRISDOL) 50000 UNITS CAPS Take  50,000 Units by mouth every 30 (thirty) days.      . vitamin E 400 UNIT capsule Take 400 Units by mouth every morning.        No current facility-administered medications on file prior to visit.    I have reviewed and updated the following as appropriate: allergies, current medications, past family history, past medical history, past social history and past surgical history SHx: non smoker   Review of Systems See HPI    Objective:   Physical Exam There were no vitals taken for this visit. Gen: alert, cooperative, NAD, sitting in wheelchair HEENT: AT/[place], sclera white, MMM Neck: supple, no LAD CV: RRR, no murmurs Pulm: CTAB, no wheezes or rales Abd: +BS, soft, NTND Ext: no edema Skin: bruising on left temple; skin tear on left knee covered with bandage      Assessment & Plan:

## 2013-07-29 NOTE — Assessment & Plan Note (Signed)
Continue to do well on decreased dose of QVAR. If remains stable next month, would stop the QVAR and leave albuterol prn.

## 2013-07-29 NOTE — Assessment & Plan Note (Addendum)
Seems to be stable.  Daughter does not have any concerns at this time. Continue aricept and namenda. Has some anxiety related to other residents at [facility].  I have restarted xanax 0.25mg  daily prn for anxiety.

## 2013-08-02 ENCOUNTER — Other Ambulatory Visit: Payer: Self-pay | Admitting: Family Medicine

## 2013-08-02 MED ORDER — ALPRAZOLAM 0.5 MG PO TABS: 0.5000 mg | ORAL_TABLET | Freq: Every day | ORAL | Status: DC | PRN

## 2013-08-02 NOTE — Telephone Encounter (Signed)
Refilled med in response to faxed request from [place]   Refilled xanax 0.5 mg, 30 tabs.

## 2013-08-28 ENCOUNTER — Encounter: Payer: Self-pay | Admitting: Pharmacist

## 2013-08-30 ENCOUNTER — Non-Acute Institutional Stay: Payer: Medicare Other | Admitting: Family Medicine

## 2013-08-30 DIAGNOSIS — I10 Essential (primary) hypertension: Secondary | ICD-10-CM

## 2013-08-30 DIAGNOSIS — M069 Rheumatoid arthritis, unspecified: Secondary | ICD-10-CM

## 2013-08-30 DIAGNOSIS — Z9181 History of falling: Secondary | ICD-10-CM

## 2013-08-30 DIAGNOSIS — R296 Repeated falls: Secondary | ICD-10-CM

## 2013-08-30 DIAGNOSIS — F0150 Vascular dementia without behavioral disturbance: Secondary | ICD-10-CM

## 2013-08-30 DIAGNOSIS — M109 Gout, unspecified: Secondary | ICD-10-CM

## 2013-09-01 DIAGNOSIS — R296 Repeated falls: Secondary | ICD-10-CM | POA: Insufficient documentation

## 2013-09-01 NOTE — Assessment & Plan Note (Signed)
No recent flairs -continue Uloric

## 2013-09-01 NOTE — Assessment & Plan Note (Signed)
Order placed for PT to help with mobility.

## 2013-09-01 NOTE — Assessment & Plan Note (Signed)
Stable on Prednisone and Tylenol as needed for pain.

## 2013-09-01 NOTE — Progress Notes (Signed)
   Subjective:    Patient ID: Connie Fitzpatrick, female    DOB: 10/10/1927, 80 y.o.   MRN: [id number]  HPI 80 y/o female with PMH dementia, rheumatoid arthritis on chronic prednisone, asthma, gout, depression, HLD, and HTN seen on 08/30/13 at [facility].  Dementia - patient currently on Namenda and Aricept, knows her name and correct month however does not know the year/location/or president, no family members present  HTN - currently on Metoprolol, no chest pain, no vision changes  Gout - currently on Uloric, no recent gout flairs  RA - joint pain is stable  History of falls - due to deconditioning/RA, not currently working with PT  No acute issues identified by the nursing staff   Review of Systems  Constitutional: Negative for fever, chills and fatigue.  Respiratory: Negative for cough, choking and shortness of breath.   Cardiovascular: Negative for chest pain.  Gastrointestinal: Negative for nausea, vomiting and diarrhea.       Objective:   Physical Exam Vitals: reviewed Gen: pleasant Caucasian female, NAD, sitting in wheelchair HEENT: normocephalic, pupils equal in size bilaterally, no scleral icterus, MMM, neck supple, no anterior or posterior cervical lymphadenopathy Cardiac: bradycardic, S1 and S2 present, no murmurs, no heaves/thrills Resp: CTAB, normal effort Abd: soft, no tenderness, normal bowel sounds MSK - bilateral hand and wrist deformity consistent with RA, no active tenderness or erythema Ext.: trace pedal edema     Assessment & Plan:  Please see problem specific assessment and plan. A]

## 2013-09-01 NOTE — Assessment & Plan Note (Signed)
Blood pressures well controlled -agree with Dr. Bridgett Larsson to decrease Metoprolol if blood pressures remain well controlled

## 2013-09-01 NOTE — Assessment & Plan Note (Signed)
Patient appears stable at this time of Namenda/Aricept. -recommend that PCP Dr. Bridgett Larsson repeat MMSE 6 month from previous (03/2013)

## 2013-09-15 ENCOUNTER — Encounter: Payer: Self-pay | Admitting: *Deleted

## 2013-09-20 ENCOUNTER — Encounter: Payer: Self-pay | Admitting: Emergency Medicine

## 2013-09-20 ENCOUNTER — Non-Acute Institutional Stay (INDEPENDENT_AMBULATORY_CARE_PROVIDER_SITE_OTHER): Payer: Medicare Other | Admitting: Emergency Medicine

## 2013-09-20 DIAGNOSIS — I10 Essential (primary) hypertension: Secondary | ICD-10-CM

## 2013-09-20 DIAGNOSIS — J45909 Unspecified asthma, uncomplicated: Secondary | ICD-10-CM

## 2013-09-20 DIAGNOSIS — M109 Gout, unspecified: Secondary | ICD-10-CM

## 2013-09-20 DIAGNOSIS — J309 Allergic rhinitis, unspecified: Secondary | ICD-10-CM

## 2013-09-20 NOTE — Assessment & Plan Note (Signed)
BP has been on the lower side the last 2 months. Given her history or orthostatic hypotension and falls, will decrease metoprolol XL to 50mg  daily. Goal BP for her is around 841-660 systolic.

## 2013-09-20 NOTE — Progress Notes (Signed)
Subjective:    Patient ID: Connie Fitzpatrick, female    DOB: [birth year], 80 y.o.   MRN: [id number]  HPI Connie Fitzpatrick was seen in her room at [facility].  She states she is doing well.  Does state that seems to have lost her mind sometime in the past 2 years.  Still recognizes family members.  She states she is doing well today except for some nasal congestion and a mild sore throat.  These symptoms started over the weekend when she went outside.  She has a history of allergies.  She denies any dizziness, chest pain, trouble breathing, leg swelling.  Reports good appetite.  States she thinks her last gout flare was 4-5 years ago.  Current Outpatient Prescriptions on File Prior to Visit  Medication Sig Dispense Refill  . acetaminophen (TYLENOL) 650 MG CR tablet Take 1 tablet (650 mg total) by mouth every 8 (eight) hours.      Marland Kitchen albuterol (PROVENTIL HFA;VENTOLIN HFA) 108 (90 BASE) MCG/ACT inhaler Inhale 2 puffs into the lungs every 4 (four) hours as needed. For shortness of breath      . Alum & Mag Hydroxide-Simeth (MAGIC MOUTHWASH) SOLN Take 5 mLs by mouth at bedtime.      . Amino Acids-Protein Hydrolys (FEEDING SUPPLEMENT, PRO-STAT SUGAR FREE 64,) LIQD Take 30 mLs by mouth daily.      . beclomethasone (QVAR) 80 MCG/ACT inhaler Inhale 1 puff into the lungs 2 (two) times daily.       . bisacodyl (DULCOLAX) 5 MG EC tablet Take 5 mg by mouth at bedtime.      . calcium citrate (CALCITRATE - DOSED IN MG ELEMENTAL CALCIUM) 950 MG tablet Take 400 mg of elemental calcium by mouth daily.      Marland Kitchen donepezil (ARICEPT) 10 MG tablet Take 10 mg by mouth daily with breakfast.      . DULoxetine (CYMBALTA) 30 MG capsule Take 30 mg by mouth at bedtime.       . ENSURE (ENSURE) Take 237 mLs by mouth daily.      . febuxostat (ULORIC) 40 MG tablet Take 40 mg by mouth daily with breakfast.      . gabapentin (NEURONTIN) 300 MG capsule Take 300 mg by mouth 2 (two) times daily.      Marland Kitchen levothyroxine (SYNTHROID,  LEVOTHROID) 75 MCG tablet Take 1 tablet (75 mcg total) by mouth daily with breakfast.  30 tablet  0  . loratadine (CLARITIN) 10 MG tablet Take 10 mg by mouth daily.      . Melatonin 5 MG CAPS Take 5 mg by mouth at bedtime.       . memantine (NAMENDA) 10 MG tablet Take 10 mg by mouth daily.      . metoprolol succinate (TOPROL-XL) 100 MG 24 hr tablet Take 50 mg by mouth at bedtime. Take with or immediately following a meal.      . Multiple Vitamin (MULITIVITAMIN WITH MINERALS) TABS Take 1 tablet by mouth at bedtime.      . multivitamin-lutein (OCUVITE-LUTEIN) CAPS Take 1 capsule by mouth daily with breakfast.      . predniSONE (DELTASONE) 5 MG tablet Take 5 mg by mouth daily.      . Vitamin D, Ergocalciferol, (DRISDOL) 50000 UNITS CAPS Take 50,000 Units by mouth every 30 (thirty) days.      . vitamin E 400 UNIT capsule Take 400 Units by mouth every morning.        No current facility-administered medications  on file prior to visit.    I have reviewed and updated the following as appropriate: allergies, current medications and problem list SHx: never smoker  Health Maintenance: on calcium and vitamin D   Review of Systems See HPI    Objective:   Physical Exam BP 102/64  Pulse 66  Temp(Src) 98 F (36.7 C)  Resp 18  Wt 101 lb 12.8 oz (46.176 kg) Gen: alert, cooperative, NAD HEENT: AT/[place], sclera white, MMM Neck: supple CV: RRR, no murmurs Pulm: CTAB, no wheezes or rales Abd: +BS, soft, NTND Ext: no edema Ext: stable contractures of her hands      Assessment & Plan:

## 2013-09-20 NOTE — Assessment & Plan Note (Signed)
Allergies currently flared. Continue loratadine daily. Hold off on additional medications for now.

## 2013-09-20 NOTE — Assessment & Plan Note (Signed)
Well controlled.  No prn albuterol use. Will decrease QVAR to 6mcg 1 puff BID. Anticipate stopping this in the next month or so.

## 2013-09-20 NOTE — Assessment & Plan Note (Signed)
Patient denies any flares in the last several years. Uric Acid was 3.1 in 12/2012. Consider stopping Uloric.

## 2013-10-20 ENCOUNTER — Encounter: Payer: Self-pay | Admitting: Pharmacist

## 2013-11-08 ENCOUNTER — Non-Acute Institutional Stay: Payer: Medicare Other | Admitting: Family Medicine

## 2013-11-08 ENCOUNTER — Encounter: Payer: Self-pay | Admitting: Family Medicine

## 2013-11-08 DIAGNOSIS — M069 Rheumatoid arthritis, unspecified: Secondary | ICD-10-CM

## 2013-11-08 DIAGNOSIS — F329 Major depressive disorder, single episode, unspecified: Secondary | ICD-10-CM

## 2013-11-08 DIAGNOSIS — F3289 Other specified depressive episodes: Secondary | ICD-10-CM

## 2013-11-08 DIAGNOSIS — I10 Essential (primary) hypertension: Secondary | ICD-10-CM

## 2013-11-08 MED ORDER — ACETAMINOPHEN ER 650 MG PO TBCR: 650.0000 mg | EXTENDED_RELEASE_TABLET | Freq: Three times a day (TID) | ORAL | Status: DC | PRN

## 2013-11-08 NOTE — Assessment & Plan Note (Signed)
A: late RA. Pain well controlled P: Change tylenol to prn

## 2013-11-08 NOTE — Progress Notes (Signed)
  Geriatric Attending Note  Subjective:    Patient ID: Connie Fitzpatrick, female    DOB: 18-Aug-1927, 80 y.o.   MRN: [id number]  HPI Patient seen for routine f/u visit: she has no complaints. Denies joint pain. CP, SOB, HA. She wants to know when she can go home. Admits that her husband's health is not the best and that he may not be able to care for her the way she needs.   Soc Hx: non smoker   Review of Systems As per HPI     Objective:   Physical Exam BP 145/60  Pulse 100  Temp(Src) 97.1 F (36.2 C)  Resp 19  Wt 99 lb 12.8 oz (45.269 kg) General appearance: alert, cooperative and no distress Lungs: clear to auscultation bilaterally Heart: regular rate and rhythm, S1, S2 normal, no murmur, click, rub or gallop Abdomen: soft, non-tender; bowel sounds normal; no masses,  no organomegaly Extremities: no edema. Significant joint deformity in hand with ulnar deviation of fingers and enlarged MCP joints consistent with late RA.     Assessment & Plan:

## 2013-11-08 NOTE — Assessment & Plan Note (Signed)
A: BP slightly elevated no last check.  P: Monitor Increase BB as needed, pulse 100.

## 2013-11-08 NOTE — Assessment & Plan Note (Signed)
A: mood stable. P: continue cymbalta 30 q HS.

## 2013-11-09 ENCOUNTER — Encounter: Payer: Self-pay | Admitting: Pharmacist

## 2013-11-13 ENCOUNTER — Non-Acute Institutional Stay (INDEPENDENT_AMBULATORY_CARE_PROVIDER_SITE_OTHER): Payer: Medicare Other | Admitting: Emergency Medicine

## 2013-11-13 ENCOUNTER — Encounter: Payer: Self-pay | Admitting: Emergency Medicine

## 2013-11-13 DIAGNOSIS — J45909 Unspecified asthma, uncomplicated: Secondary | ICD-10-CM

## 2013-11-13 DIAGNOSIS — J309 Allergic rhinitis, unspecified: Secondary | ICD-10-CM

## 2013-11-13 DIAGNOSIS — M109 Gout, unspecified: Secondary | ICD-10-CM

## 2013-11-13 NOTE — Assessment & Plan Note (Signed)
Continues to have some allergy symptoms. Continue loratadine daily.

## 2013-11-13 NOTE — Assessment & Plan Note (Signed)
No albuterol use in the last 6 weeks. Will d/c QVAR.  If starts to require albuterol >3x/week, would restart QVAR.

## 2013-11-13 NOTE — Progress Notes (Signed)
Subjective:    Patient ID: Connie Fitzpatrick, female    DOB: 10-18-1927, 80 y.o.   MRN: [id number]  HPI Connie Fitzpatrick was seen at [facility].  She states she is doing well today.  No acute concerns.  Denies chest pain, trouble breathing, pain, abdominal discomfort.  States she thinks she is eating well.  She does get 2 supplemental shakes a day.  States sometimes the food is not very good.  Current Outpatient Prescriptions on File Prior to Visit  Medication Sig Dispense Refill  . acetaminophen (TYLENOL) 650 MG CR tablet Take 650 mg by mouth 2 (two) times daily as needed for pain.      Marland Kitchen albuterol (PROVENTIL HFA;VENTOLIN HFA) 108 (90 BASE) MCG/ACT inhaler Inhale 2 puffs into the lungs every 4 (four) hours as needed. For shortness of breath      . Alum & Mag Hydroxide-Simeth (MAGIC MOUTHWASH) SOLN Take 5 mLs by mouth at bedtime.      . Amino Acids-Protein Hydrolys (FEEDING SUPPLEMENT, PRO-STAT SUGAR FREE 64,) LIQD Take 30 mLs by mouth daily.      . bisacodyl (DULCOLAX) 5 MG EC tablet Take 5 mg by mouth at bedtime.      . calcium citrate (CALCITRATE - DOSED IN MG ELEMENTAL CALCIUM) 950 MG tablet Take 400 mg of elemental calcium by mouth daily.      Marland Kitchen donepezil (ARICEPT) 10 MG tablet Take 10 mg by mouth daily with breakfast.      . DULoxetine (CYMBALTA) 30 MG capsule Take 30 mg by mouth at bedtime.       . ENSURE (ENSURE) Take 237 mLs by mouth daily.      Marland Kitchen gabapentin (NEURONTIN) 300 MG capsule Take 300 mg by mouth 2 (two) times daily.      Marland Kitchen levothyroxine (SYNTHROID, LEVOTHROID) 75 MCG tablet Take 1 tablet (75 mcg total) by mouth daily with breakfast.  30 tablet  0  . loratadine (CLARITIN) 10 MG tablet Take 10 mg by mouth daily.      . Melatonin 5 MG CAPS Take 5 mg by mouth at bedtime.       . memantine (NAMENDA) 10 MG tablet Take 10 mg by mouth daily.      . metoprolol succinate (TOPROL-XL) 100 MG 24 hr tablet Take 50 mg by mouth at bedtime. Take with or immediately following a meal.       . Multiple Vitamin (MULITIVITAMIN WITH MINERALS) TABS Take 1 tablet by mouth at bedtime.      . multivitamin-lutein (OCUVITE-LUTEIN) CAPS Take 1 capsule by mouth daily with breakfast.      . predniSONE (DELTASONE) 5 MG tablet Take 5 mg by mouth daily.      . Vitamin D, Ergocalciferol, (DRISDOL) 50000 UNITS CAPS Take 50,000 Units by mouth every 30 (thirty) days.      . vitamin E 400 UNIT capsule Take 400 Units by mouth every morning.        No current facility-administered medications on file prior to visit.    I have reviewed and updated the following as appropriate: allergies, current medications, past family history, past medical history, past social history, past surgical history and problem list SHx: non smoker   Review of Systems See HPI    Objective:   Physical Exam BP 145/60  Pulse 100  Temp(Src) 97.1 F (36.2 C)  Resp 23  Wt 99 lb 12.8 oz (45.269 kg) Gen: alert, cooperative, NAD HEENT: AT/[place], sclera white, MMM Neck: supple CV:  RRR, no murmurs Pulm: CTAB, no wheezes or rales Ext: no edema, 1+ PT pulses bilaterally, feet cool, stable contractures of hands       Assessment & Plan:

## 2013-11-13 NOTE — Assessment & Plan Note (Signed)
Distant history of gout attacks. Will stop Uloric and check a uric acid level in 6 weeks.

## 2013-12-07 ENCOUNTER — Other Ambulatory Visit: Payer: Self-pay | Admitting: Family Medicine

## 2013-12-12 ENCOUNTER — Encounter: Payer: Self-pay | Admitting: Family Medicine

## 2013-12-12 ENCOUNTER — Non-Acute Institutional Stay: Payer: Medicare Other | Admitting: Family Medicine

## 2013-12-12 DIAGNOSIS — F0150 Vascular dementia without behavioral disturbance: Secondary | ICD-10-CM

## 2013-12-12 DIAGNOSIS — M069 Rheumatoid arthritis, unspecified: Secondary | ICD-10-CM

## 2013-12-12 DIAGNOSIS — R1312 Dysphagia, oropharyngeal phase: Secondary | ICD-10-CM

## 2013-12-12 DIAGNOSIS — E039 Hypothyroidism, unspecified: Secondary | ICD-10-CM

## 2013-12-12 DIAGNOSIS — I10 Essential (primary) hypertension: Secondary | ICD-10-CM

## 2013-12-12 NOTE — Assessment & Plan Note (Signed)
Stable, Seems to be doing well despite fatigue today TSH due, consider adding to uric acid which is ordered (I believe) on 8/1

## 2013-12-12 NOTE — Assessment & Plan Note (Signed)
Stable, MMSE score stable from previous scores previously listed below, one from 10/14 likely and outlier Continue namenda and aricept

## 2013-12-12 NOTE — Progress Notes (Signed)
Patient ID: Connie Fitzpatrick, female   DOB: 04-18-1928, 80 y.o.   MRN: [id number]  Kenn File, MD Phone: [phone]  Subjective:  Chief complaint-noted  Pt seen at [facility] for routine care  Overall pt is feeling well without complaints. She states that she feels tired and "spaced out" today. She did not go to PT today due to feeling too tired and attributing her spaced out feeling and tiredness to over working yesterday.   HTN She is taking metoprolol XL 50 mg daily Her BP is not recorded in at least the 10 days, Its controlled today per Physical Exam section below.  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  She cannot remember ever taking an MMSE She is on aricept and namenda.   Hypothyroidism Tiredness/fatigue she states is only a problem today She is on 75 mcg synthroid daily  RA Only slight L hand pain today, states she doesn't like the nodules Taking prednisone daily and tylenol PRN  ROS-  No fevers, chills, sweats Normal appetite No chest pain No dyspnea  Past Medical History Patient Active Problem List   Diagnosis Date Noted  . Frequent falls 09/01/2013  . Neuropathy of both feet 07/12/2013  . Nursing home resident 05/10/2013  . Orthostatic hypotension 07/28/2012  . Trigger finger, acquired 07/12/2012  . Wheelchair dependent 06/29/2012  . Dementia, vascular 05/02/2012  . Gout synovitis   . HYPOTHYROIDISM 08/13/2010  . Rheumatoid arthritis(714.0) 08/13/2010  . HYPERLIPIDEMIA 08/11/2010  . HYPERTENSION 08/11/2010  . ALLERGIC RHINITIS 08/11/2010  . ASTHMA 08/11/2010  . GERD 08/11/2010  . URINARY INCONTINENCE 08/11/2010  . History of alcohol abuse 08/04/2010  . DEPRESSION 08/04/2010  . OSTEOARTHRITIS 08/04/2010  . INSOMNIA 08/04/2010    . KNEE REPLACEMENT, BILATERAL, HX OF 08/04/2010    Medications- reviewed and updated Current Outpatient Prescriptions  Medication Sig Dispense Refill  . acetaminophen (TYLENOL) 650 MG CR tablet Take 650 mg by mouth 2 (two) times daily as needed for pain.      Marland Kitchen albuterol (PROVENTIL HFA;VENTOLIN HFA) 108 (90 BASE) MCG/ACT inhaler Inhale 2 puffs into the lungs every 4 (four) hours as needed. For shortness of breath      . Alum & Mag Hydroxide-Simeth (MAGIC MOUTHWASH) SOLN Take 5 mLs by mouth at bedtime.      . Amino Acids-Protein Hydrolys (FEEDING SUPPLEMENT, PRO-STAT SUGAR FREE 64,) LIQD Take 30 mLs by mouth daily.      . bisacodyl (DULCOLAX) 5 MG EC tablet Take 5 mg by mouth at bedtime.      . calcium citrate (CALCITRATE - DOSED IN MG ELEMENTAL CALCIUM) 950 MG tablet Take 400 mg of elemental calcium by mouth daily.      Marland Kitchen donepezil (ARICEPT) 10 MG tablet Take 10 mg by mouth daily with breakfast.      . DULoxetine (CYMBALTA) 30 MG capsule Take 30 mg by mouth at bedtime.       . ENSURE (ENSURE) Take 237 mLs by mouth daily.      Marland Kitchen gabapentin (NEURONTIN) 300 MG capsule Take 300 mg by mouth 2 (two) times daily.      Marland Kitchen levothyroxine (SYNTHROID, LEVOTHROID) 75 MCG tablet Take 1 tablet (75 mcg total) by mouth daily with breakfast.  30 tablet  0  . loratadine (  CLARITIN) 10 MG tablet Take 10 mg by mouth daily.      . Melatonin 5 MG CAPS Take 5 mg by mouth at bedtime.       . memantine (NAMENDA) 10 MG tablet Take 10 mg by mouth daily.      . metoprolol succinate (TOPROL-XL) 100 MG 24 hr tablet Take 50 mg by mouth at bedtime. Take with or immediately following a meal.      . multivitamin-lutein (OCUVITE-LUTEIN) CAPS Take 1 capsule by mouth daily with breakfast.      . predniSONE (DELTASONE) 5 MG tablet Take 5 mg by mouth daily.      . Vitamin D, Ergocalciferol, (DRISDOL) 50000 UNITS CAPS Take 50,000 Units by mouth every 30 (thirty) days.      . vitamin E 400 UNIT capsule Take 400 Units by mouth every  morning.       . Multiple Vitamin (MULITIVITAMIN WITH MINERALS) TABS Take 1 tablet by mouth at bedtime.       No current facility-administered medications for this visit.    Objective: BP 139/76  Pulse 65  Temp(Src) 97.2 F (36.2 C)  Resp 18  SpO2 95% Gen: NAD, alert, cooperative with exam, very pleasantly demented HEENT: NCAT, EOMI CV: RRR, good S1/S2, no murmur Resp: CTABL, no wheezes, non-labored Abd: SNTND, BS present, no guarding or organomegaly Ext: trace to 1+ BL LE edema, non tender, BL hands with large rheumatoid nodules and ulnar deviation Neuro: Alert, sensation intact grossly on BL LE,   MMSE Score 23/30, lost 2 for orientation, 3 for World, 1 for 3 stage command, and 1 for copying design   Assessment/Plan:  Dementia, vascular Stable, MMSE score stable from previous scores previously listed below, one from 10/14 likely and outlier Continue namenda and aricept  HYPERTENSION Controlled Decreased to 50 mg metoprolol XL daily from 100 on 4/22 due to lows then and orthostatic hypotension, has improved well from that point Goal 681-275 systolic  Continue 50 mg Metop XL  HYPOTHYROIDISM Stable, Seems to be doing well despite fatigue today TSH due, consider adding to uric acid which is ordered (I believe) on 8/1  Rheumatoid arthritis(714.0) Well controlled Only slight pain that is not limiting her Continue daily prednisone and PRN tylenol

## 2013-12-12 NOTE — Assessment & Plan Note (Signed)
Controlled Decreased to 50 mg metoprolol XL daily from 100 on 4/22 due to lows then and orthostatic hypotension, has improved well from that point Goal 528-413 systolic  Continue 50 mg Metop XL

## 2013-12-12 NOTE — Assessment & Plan Note (Signed)
Well controlled Only slight pain that is not limiting her Continue daily prednisone and PRN tylenol

## 2013-12-15 ENCOUNTER — Encounter: Payer: Self-pay | Admitting: Family Medicine

## 2013-12-15 DIAGNOSIS — R1312 Dysphagia, oropharyngeal phase: Secondary | ICD-10-CM

## 2013-12-15 HISTORY — DX: Dysphagia, oropharyngeal phase: R13.12

## 2013-12-15 NOTE — Addendum Note (Signed)
Addended byWendy Poet, Cleona Doubleday D on: 12/15/2013 03:47 PM   Modules accepted: Level of Service

## 2013-12-15 NOTE — Progress Notes (Addendum)
I have seen and examined this patient on 12/15/13. I have discussed with Dr Wendi Snipes.  I agree with his findings and plans as documented in their NH visit note from 12/12/13.  Additionally, Connie Fitzpatrick has been having oropharyngeal dysphagia over the last week to solids. No history of dysphagia.  No pain with swallowing.  Agree with Speech Therapy is recommendation of skilled ST 5 times a week for 4 weeks and mechanical soft diet with pureed meats. Pt may continue thin liquids.   TSH level to monitor levothyroxine therapy for hypothyroidism scheduled to be drawn 12/30/13.

## 2013-12-20 ENCOUNTER — Other Ambulatory Visit: Payer: Self-pay | Admitting: Family Medicine

## 2013-12-20 MED ORDER — CETIRIZINE HCL 10 MG PO TABS
10.0000 mg | ORAL_TABLET | Freq: Every day | ORAL | Status: DC
Start: 2013-12-20 — End: 2014-08-20

## 2014-01-09 LAB — BASIC METABOLIC PANEL
BUN: 48 mg/dL — AB (ref 4–21)
CREATININE: 0.9 mg/dL (ref 0.5–1.1)
Potassium: 4.8 mmol/L (ref 3.4–5.3)
Sodium: 144 mmol/L (ref 137–147)

## 2014-01-15 ENCOUNTER — Encounter: Payer: Self-pay | Admitting: Family Medicine

## 2014-01-15 LAB — CALCIUM: Calcium: 9.5 mg/dL

## 2014-01-15 LAB — URIC ACID: Uric Acid: 6.3 mg/dl

## 2014-01-16 ENCOUNTER — Encounter: Payer: Self-pay | Admitting: Pharmacist

## 2014-02-02 ENCOUNTER — Non-Acute Institutional Stay (INDEPENDENT_AMBULATORY_CARE_PROVIDER_SITE_OTHER): Payer: Medicare Other | Admitting: Family Medicine

## 2014-02-02 DIAGNOSIS — T148XXA Other injury of unspecified body region, initial encounter: Secondary | ICD-10-CM | POA: Insufficient documentation

## 2014-02-02 DIAGNOSIS — IMO0002 Reserved for concepts with insufficient information to code with codable children: Secondary | ICD-10-CM

## 2014-02-02 NOTE — Progress Notes (Signed)
Patient ID: Connie Fitzpatrick, female   DOB: Feb 28, 1928, 80 y.o.   MRN: [id number]      Subjective:  Patient was seen in the nursing home do to new left lower extremity wound. She denies any trauma or any current pain. She does state that she has intermittent sharp pain from the wound when she tries to move around that radiates up to her left hip.  I spoke to the [facility] nurse who states that she began dressing and last night and had moderate amount of serous drainage at that time. She also denies any known trauma.  Objective: Gen: NAD, alert, cooperative with exam, sitting in her wheelchair HEENT: NCAT Extremity:  2 Pink bandages present on the left lower extremity on the posterior calf.  Distal wound approximately 0.5 cm x 2 cm, superficial, minimum to moderate serous drainage. No tenderness to palpation No surrounding erythema, fluctuance, induration, warmth  Proximal wound not open, small approximately 50mm circular hypopigmented lesion without any drainage or open skin.   A/P:  Abrasion Left lower extremity wound most consistent with abrasion No history of trauma, however this is likely limited by the patient's dementia. No signs of infection We'll continue routine wound care the [facility] nurse. Followup as needed   Laroy Apple, MD [place] Resident, PGY-3 02/02/2014, 3:56 PM

## 2014-02-02 NOTE — Assessment & Plan Note (Signed)
Left lower extremity wound most consistent with abrasion No history of trauma, however this is likely limited by the patient's dementia. No signs of infection We'll continue routine wound care the [facility] nurse. Followup as needed

## 2014-02-06 NOTE — Progress Notes (Signed)
Patient ID: Connie Fitzpatrick, female   DOB: 1927-09-05, 80 y.o.   MRN: [id number] Reviewed: Agree with Dr. Alen Bleacher documentation and management.

## 2014-02-12 ENCOUNTER — Non-Acute Institutional Stay: Payer: Medicare Other | Admitting: Family Medicine

## 2014-02-12 DIAGNOSIS — E039 Hypothyroidism, unspecified: Secondary | ICD-10-CM

## 2014-02-12 DIAGNOSIS — J45909 Unspecified asthma, uncomplicated: Secondary | ICD-10-CM

## 2014-02-12 DIAGNOSIS — G4700 Insomnia, unspecified: Secondary | ICD-10-CM

## 2014-02-12 DIAGNOSIS — M069 Rheumatoid arthritis, unspecified: Secondary | ICD-10-CM

## 2014-02-12 DIAGNOSIS — G609 Hereditary and idiopathic neuropathy, unspecified: Secondary | ICD-10-CM

## 2014-02-12 DIAGNOSIS — F329 Major depressive disorder, single episode, unspecified: Secondary | ICD-10-CM

## 2014-02-12 DIAGNOSIS — E785 Hyperlipidemia, unspecified: Secondary | ICD-10-CM

## 2014-02-12 DIAGNOSIS — IMO0002 Reserved for concepts with insufficient information to code with codable children: Secondary | ICD-10-CM

## 2014-02-12 DIAGNOSIS — I10 Essential (primary) hypertension: Secondary | ICD-10-CM

## 2014-02-12 DIAGNOSIS — M109 Gout, unspecified: Secondary | ICD-10-CM

## 2014-02-12 DIAGNOSIS — J309 Allergic rhinitis, unspecified: Secondary | ICD-10-CM

## 2014-02-12 DIAGNOSIS — F3289 Other specified depressive episodes: Secondary | ICD-10-CM

## 2014-02-12 DIAGNOSIS — G5793 Unspecified mononeuropathy of bilateral lower limbs: Secondary | ICD-10-CM

## 2014-02-12 DIAGNOSIS — Z9181 History of falling: Secondary | ICD-10-CM

## 2014-02-12 DIAGNOSIS — R296 Repeated falls: Secondary | ICD-10-CM

## 2014-02-12 DIAGNOSIS — I951 Orthostatic hypotension: Secondary | ICD-10-CM

## 2014-02-12 DIAGNOSIS — F0150 Vascular dementia without behavioral disturbance: Secondary | ICD-10-CM

## 2014-02-12 DIAGNOSIS — T148XXA Other injury of unspecified body region, initial encounter: Secondary | ICD-10-CM

## 2014-02-12 NOTE — Assessment & Plan Note (Signed)
Consider lipid panel with next draw No meds currently, no clear indication

## 2014-02-12 NOTE — Assessment & Plan Note (Signed)
No reports of recent falls Previously felt to be due to RA and OA, also Hx of orthostatic hypotension Continue to follow.

## 2014-02-12 NOTE — Assessment & Plan Note (Signed)
Stable, Controlled Continue chronic prednisone

## 2014-02-12 NOTE — Assessment & Plan Note (Signed)
Stable No MMSE today (performed 2 months ago) continue namenda and aricept

## 2014-02-12 NOTE — Assessment & Plan Note (Signed)
Controlled Continue metop XL 50

## 2014-02-12 NOTE — Assessment & Plan Note (Signed)
Stable Continue synthroid Consider TSH on next draw

## 2014-02-12 NOTE — Assessment & Plan Note (Signed)
No joint pains currently uloric dc'd 11/13/2013 and Uric acid WNL on 12/30/2013 Continue chronic prednisone (for RA) and monitor

## 2014-02-12 NOTE — Assessment & Plan Note (Signed)
No events and BP appears to be appropriate Avoid aggressive Tx of HTN Systolic goal [phone]

## 2014-02-12 NOTE — Assessment & Plan Note (Signed)
Clean/dry/intact bandage Doing well per patient Continue routine woudn care

## 2014-02-12 NOTE — Assessment & Plan Note (Signed)
No complaits, stable Continue melatonin

## 2014-02-12 NOTE — Assessment & Plan Note (Signed)
Stable, no complaints of symptoms today Continue neurontin

## 2014-02-12 NOTE — Assessment & Plan Note (Signed)
Stable. Continue cymbalta.  

## 2014-02-12 NOTE — Progress Notes (Signed)
Patient ID: Connie Fitzpatrick, female   DOB: [birth year], 79 y.o.   MRN: [id number]   Subjective:  Pt seen in nursing home for routine/regulatory visit.   She denies any pains or dyspnea today and does not have any complaints. She states that she is doing well overall and has a good appetite.   She states that her LLE wound is doing well, not bothering her at this time. She is happy with how much it is being attended to.   She denies any joint pains today.   Medicine list reviewed in detail and updated in epic  ROS:  Per HPI  Objective: Filed Vitals:   02/12/14 1047  BP: 118/53  Pulse: 58  Temp: 96.2 F (35.7 C)  Resp: 18   Gen: NAD, alert, cooperative with exam HEENT: NCAT, MMM CV: RRR, good S1/S2 Resp: CTABL, no wheezes, non-labored Abd: SNTND, BS present, no guarding or organomegaly Ext: trace pitting edema BL LE Neuro: Alert and interactive, EOMI  Assessment and plan:  Abrasion Clean/dry/intact bandage Doing well per patient Continue routine woudn care   ALLERGIC RHINITIS Stable, continue zyrtec  ASTHMA No dyspnea and no albuterol use in the last 2 weeks continue PRN albuterol  Dementia, vascular Stable No MMSE today (performed 2 months ago) continue namenda and aricept  DEPRESSION Stable Continue cymbalta  Frequent falls No reports of recent falls Previously felt to be due to RA and OA, also Hx of orthostatic hypotension Continue to follow.   Gout synovitis No joint pains currently uloric dc'd 11/13/2013 and Uric acid WNL on 12/30/2013 Continue chronic prednisone (for RA) and monitor  HYPERLIPIDEMIA Consider lipid panel with next draw No meds currently, no clear indication   HYPERTENSION Controlled Continue metop XL 50   HYPOTHYROIDISM Stable Continue synthroid Consider TSH on next draw  Orthostatic hypotension No events and BP appears to be appropriate Avoid aggressive Tx of HTN Systolic goal [phone]  Rheumatoid  arthritis(714.0) Stable, Controlled Continue chronic prednisone  Neuropathy of both feet Stable, no complaints of symptoms today Continue neurontin  INSOMNIA No complaits, stable Continue melatonin

## 2014-02-12 NOTE — Assessment & Plan Note (Addendum)
No dyspnea and no albuterol use in the last 2 weeks continue PRN albuterol

## 2014-02-12 NOTE — Assessment & Plan Note (Signed)
Stable, continue zyrtec.  

## 2014-02-14 ENCOUNTER — Encounter: Payer: Self-pay | Admitting: Family Medicine

## 2014-02-14 NOTE — Progress Notes (Signed)
Patient ID: Connie Fitzpatrick, female   DOB: [birth year], 80 y.o.   MRN: [id number] I have seen and examined this patient with Dr Wendi Snipes. I have discussed with him.  I agree with their findings and plans as documented in their regulatory visit note.

## 2014-02-21 ENCOUNTER — Encounter: Payer: Self-pay | Admitting: Pharmacist

## 2014-03-01 LAB — URIC ACID: Uric Acid: 6.7 mg/dl (ref 2.4–7.0)

## 2014-03-13 ENCOUNTER — Encounter: Payer: Self-pay | Admitting: Family Medicine

## 2014-03-15 ENCOUNTER — Encounter: Payer: Self-pay | Admitting: Pharmacist

## 2014-04-01 ENCOUNTER — Encounter (HOSPITAL_COMMUNITY): Payer: Self-pay | Admitting: Family Medicine

## 2014-04-01 ENCOUNTER — Emergency Department (HOSPITAL_COMMUNITY)
Admission: EM | Admit: 2014-04-01 | Discharge: 2014-04-01 | Disposition: A | Discharge status: Home / Self Care | Payer: Medicare Other | Attending: Emergency Medicine | Admitting: Emergency Medicine

## 2014-04-01 DIAGNOSIS — L03116 Cellulitis of left lower limb: Secondary | ICD-10-CM | POA: Diagnosis not present

## 2014-04-01 DIAGNOSIS — Z8669 Personal history of other diseases of the nervous system and sense organs: Secondary | ICD-10-CM | POA: Insufficient documentation

## 2014-04-01 DIAGNOSIS — K219 Gastro-esophageal reflux disease without esophagitis: Secondary | ICD-10-CM | POA: Diagnosis not present

## 2014-04-01 DIAGNOSIS — G8929 Other chronic pain: Secondary | ICD-10-CM | POA: Insufficient documentation

## 2014-04-01 DIAGNOSIS — Z79899 Other long term (current) drug therapy: Secondary | ICD-10-CM | POA: Insufficient documentation

## 2014-04-01 DIAGNOSIS — E785 Hyperlipidemia, unspecified: Secondary | ICD-10-CM | POA: Diagnosis not present

## 2014-04-01 DIAGNOSIS — I10 Essential (primary) hypertension: Secondary | ICD-10-CM | POA: Insufficient documentation

## 2014-04-01 DIAGNOSIS — R296 Repeated falls: Secondary | ICD-10-CM | POA: Diagnosis not present

## 2014-04-01 DIAGNOSIS — F329 Major depressive disorder, single episode, unspecified: Secondary | ICD-10-CM | POA: Insufficient documentation

## 2014-04-01 DIAGNOSIS — Z7952 Long term (current) use of systemic steroids: Secondary | ICD-10-CM | POA: Diagnosis not present

## 2014-04-01 DIAGNOSIS — J45909 Unspecified asthma, uncomplicated: Secondary | ICD-10-CM | POA: Insufficient documentation

## 2014-04-01 DIAGNOSIS — M069 Rheumatoid arthritis, unspecified: Secondary | ICD-10-CM | POA: Diagnosis not present

## 2014-04-01 DIAGNOSIS — F0390 Unspecified dementia without behavioral disturbance: Secondary | ICD-10-CM | POA: Diagnosis not present

## 2014-04-01 DIAGNOSIS — R197 Diarrhea, unspecified: Secondary | ICD-10-CM | POA: Insufficient documentation

## 2014-04-01 DIAGNOSIS — M79662 Pain in left lower leg: Secondary | ICD-10-CM | POA: Diagnosis present

## 2014-04-01 LAB — BASIC METABOLIC PANEL
Anion gap: 11 (ref 5–15)
BUN: 32 mg/dL — AB (ref 6–23)
CHLORIDE: 104 meq/L (ref 96–112)
CO2: 27 mEq/L (ref 19–32)
Calcium: 9.6 mg/dL (ref 8.4–10.5)
Creatinine, Ser: 0.89 mg/dL (ref 0.50–1.10)
GFR calc Af Amer: 68 mL/min — ABNORMAL LOW (ref >90)
GFR calc non Af Amer: 58 mL/min — ABNORMAL LOW (ref >90)
GLUCOSE: 113 mg/dL — AB (ref 70–99)
POTASSIUM: 4.8 meq/L (ref 3.7–5.3)
SODIUM: 142 meq/L (ref 137–147)

## 2014-04-01 LAB — CBC WITH DIFFERENTIAL/PLATELET
Basophils Absolute: 0 10*3/uL (ref 0.0–0.1)
Basophils Relative: 0 % (ref 0–1)
EOS ABS: 0.1 10*3/uL (ref 0.0–0.7)
Eosinophils Relative: 2 % (ref 0–5)
HCT: 42.2 % (ref 36.0–46.0)
HEMOGLOBIN: 13.4 g/dL (ref 12.0–15.0)
LYMPHS ABS: 2 10*3/uL (ref 0.7–4.0)
LYMPHS PCT: 28 % (ref 12–46)
MCH: 31.6 pg (ref 26.0–34.0)
MCHC: 31.8 g/dL (ref 30.0–36.0)
MCV: 99.5 fL (ref 78.0–100.0)
MONOS PCT: 16 % — AB (ref 3–12)
Monocytes Absolute: 1.1 10*3/uL — ABNORMAL HIGH (ref 0.1–1.0)
NEUTROS PCT: 54 % (ref 43–77)
Neutro Abs: 3.9 10*3/uL (ref 1.7–7.7)
PLATELETS: 272 10*3/uL (ref 150–400)
RBC: 4.24 MIL/uL (ref 3.87–5.11)
RDW: 13 % (ref 11.5–15.5)
WBC: 7.2 10*3/uL (ref 4.0–10.5)

## 2014-04-01 MED ORDER — CEPHALEXIN 250 MG PO CAPS
500.0000 mg | ORAL_CAPSULE | Freq: Once | ORAL | Status: AC
  Administered 2014-04-01: 500 mg via ORAL
  Filled 2014-04-01: qty 2

## 2014-04-01 MED ORDER — FLUCONAZOLE 150 MG PO TABS: 150.0000 mg | ORAL_TABLET | Freq: Every day | ORAL | Status: DC

## 2014-04-01 MED ORDER — CEPHALEXIN 500 MG PO CAPS: 500.0000 mg | ORAL_CAPSULE | Freq: Three times a day (TID) | ORAL | Status: DC

## 2014-04-01 NOTE — Discharge Instructions (Signed)
Please read and follow all provided instructions.  Your diagnoses today include:  1. Cellulitis of left lower extremity    Tests performed today include:  Vital signs. See below for your results today.   Blood counts and electrolytes - normal  Medications prescribed:   Keflex (cephalexin) - antibiotic  You have been prescribed an antibiotic medicine: take the entire course of medicine even if you are feeling better. Stopping early can cause the antibiotic not to work.   Diflucan - medication for yeast infection if needed  Take any prescribed medications only as directed.   Home care instructions:  Follow any educational materials contained in this packet. Keep affected area above the level of your heart when possible. Wash area gently twice a day with warm soapy water. Do not apply alcohol or hydrogen peroxide. Cover the area if it draining or weeping.   Follow-up instructions: You need to see your doctor in 48 hours for a recheck.   Return instructions:  Return to the Emergency Department if you have:  Fever  Worsening symptoms  Worsening pain  Worsening swelling  Redness of the skin that moves away from the affected area, especially if it streaks away from the affected area   Any other emergent concerns  Your vital signs today were: BP 197/61 mmHg   Pulse 47   Temp(Src) 97.8 F (36.6 C) (Oral)   Resp 20   SpO2 100% If your blood pressure (BP) was elevated above 135/85 this visit, please have this repeated by your doctor within one month. --------------

## 2014-04-01 NOTE — ED Notes (Signed)
[facility] at bedside 

## 2014-04-01 NOTE — ED Notes (Signed)
Pt from [facility] via [facility] with c/o LLE calf pain and edema that began 2 days prior. Pt denies pain at this time. Pt is at neuro baseline.

## 2014-04-01 NOTE — ED Notes (Signed)
Pt and pt's daughter-in-law comfortable with discharge and follow up instructions. Prescriptions x2.  Pt to be transported via [facility].

## 2014-04-01 NOTE — ED Notes (Signed)
Patient's daughter notes that patient has small wound to Left calf that has red streaking and is very tender to the touch, reports patient had diarrhea x2 today and has not eaten/drank anything.  Pt took 2 immodium PTA.

## 2014-04-01 NOTE — ED Provider Notes (Signed)
CSN: [id number]     Arrival date & time 04/01/14  1352 History   First MD Initiated Contact with Patient 04/01/14 1354     Chief Complaint  Patient presents with  . Leg Pain     (Consider location/radiation/quality/duration/timing/severity/associated sxs/prior Treatment) HPI Comments: Patients with history of dementia,no immunocompromising states -- presents with worsening redness and warmth of her left lower extremity starting 2 days ago. Prior to this, patient had developed a wound in the area after striking her leg on a door frame. It was healing well until 2 days ago when family noted increasing redness and swelling. Patient was unable to see her primary care physician so she presents to the emergency department for evaluation. No fevers, nausea, vomiting. She complains of soreness when the area is touched. Otherwise no treatments prior to arrival other than a dressing. No drainage noted from the area.  Family also notes diarrhea and decreased oral intake starting today. Patient was given Imodium prior to arrival. No vomiting.   Patient is a 80 y.o. female presenting with leg pain. The history is provided by the patient, a relative and medical records.  Leg Pain Associated symptoms: no fever     Past Medical History  Diagnosis Date  . Gout   . INSOMNIA   . ALLERGIC RHINITIS   . ASTHMA   . OSTEOARTHRITIS   . Rheumatoid arthritis(714.0)   . HYPERTENSION   . HYPERLIPIDEMIA   . GERD (gastroesophageal reflux disease)   . Depression   . ALCOHOL ABUSE     hx detox, dry since 2010  . HYPOTHYROIDISM   . URINARY INCONTINENCE   . Dementia   . Recurrent falls   . SDH (subdural hematoma)     a. 2008 in setting of fall.  . Chronic pain   . Perforated small intestine     a. From swallowed foreign body - s/p exlap with small bowel resection 09/2011.  Marland Kitchen Dysphagia, oropharyngeal 12/15/2013   Past Surgical History  Procedure Laterality Date  . Abdominal hysterectomy    . Breast surgery     . Total knee arthroplasty    . Vaginal prolapse repair    . Total knee arthroplasty  07/2002    right  . Total knee arthroplasty  09/1998    Left  . Cataract extraction  2001  . L-spine  1998  . Colpocleisis vaginal [place]  06/2010  . Laparotomy  10/26/2011    Procedure: EXPLORATORY LAPAROTOMY;  Surgeon: Imogene Burn. Georgette Dover, MD;  Location: [place];  Service: General;  Laterality: N/A;  . Bowel resection  10/26/2011    Procedure: SMALL BOWEL RESECTION;  Surgeon: Imogene Burn. Georgette Dover, MD;  Location: [place] [facility];  Service: General;  Laterality: N/A;   Family History  Problem Relation Age of Onset  . Arthritis Mother   . Diabetes Mother   . Hypertension Mother   . Lung cancer Father   . Stroke Father    History  Substance Use Topics  . Smoking status: Never Smoker   . Smokeless tobacco: Not on file  . Alcohol Use: No     Comment: Previous hx of alcohol abuse   OB History    No data available     Review of Systems  Constitutional: Negative for fever.  HENT: Negative for rhinorrhea and sore throat.   Eyes: Negative for redness.  Respiratory: Negative for cough.   Cardiovascular: Negative for chest pain.  Gastrointestinal: Positive for diarrhea. Negative for nausea, vomiting and abdominal  pain.  Genitourinary: Negative for dysuria.  Musculoskeletal: Positive for myalgias.  Skin: Positive for color change and wound. Negative for rash.  Neurological: Negative for headaches.  Hematological: Negative for adenopathy.  Psychiatric/Behavioral: Positive for confusion (baseline, unchanged).      Allergies  Enalapril maleate; Azithromycin; Morphine and related; Nsaids; Valacyclovir hcl; and Allopurinol  Home Medications   Prior to Admission medications   Medication Sig Start Date End Date Taking? Authorizing Provider  acetaminophen (TYLENOL) 650 MG CR tablet Take 650 mg by mouth 2 (two) times daily.  11/08/13  Yes Josalyn C Funches, MD  albuterol (PROVENTIL HFA;VENTOLIN HFA) 108 (90 BASE)  MCG/ACT inhaler Inhale 2 puffs into the lungs every 4 (four) hours as needed. For shortness of breath 06/03/12  Yes Candelaria Celeste, MD  Alum & Mag Hydroxide-Simeth (MAGIC MOUTHWASH) SOLN Take 5 mLs by mouth See admin instructions. Take 1 - 65ml daily AND as needed with PRN meds at 8pm   Yes Historical Provider, MD  Amino Acids-Protein Hydrolys (FEEDING SUPPLEMENT, PRO-STAT SUGAR FREE 64,) LIQD Take 30 mLs by mouth daily.   Yes Historical Provider, MD  bisacodyl (DULCOLAX) 10 MG suppository Place 10 mg rectally as needed for moderate constipation.   Yes Historical Provider, MD  bisacodyl (DULCOLAX) 5 MG EC tablet Take 5 mg by mouth daily at 8 pm.    Yes Historical Provider, MD  calcium citrate (CALCITRATE - DOSED IN MG ELEMENTAL CALCIUM) 950 MG tablet Take 400 mg of elemental calcium by mouth daily.   Yes Historical Provider, MD  cetirizine (ZYRTEC) 10 MG tablet Take 1 tablet (10 mg total) by mouth daily. 12/20/13  Yes Hilton Sinclair, MD  donepezil (ARICEPT) 10 MG tablet Take 10 mg by mouth daily with breakfast.   Yes Historical Provider, MD  DULoxetine (CYMBALTA) 30 MG capsule Take 30 mg by mouth daily at 8 pm.    Yes Historical Provider, MD  ENSURE (ENSURE) Take 237 mLs by mouth daily before lunch.    Yes Historical Provider, MD  febuxostat (ULORIC) 40 MG tablet Take 40 mg by mouth daily.   Yes Historical Provider, MD  gabapentin (NEURONTIN) 300 MG capsule Take 300 mg by mouth 2 (two) times daily.   Yes Historical Provider, MD  guaifenesin (ROBITUSSIN) 100 MG/5ML syrup Take 200 mg by mouth every 4 (four) hours as needed for cough.   Yes Historical Provider, MD  Influenza vac split quadrivalent PF (FLUARIX QUADRIVALENT) 0.5 ML injection Inject 0.5 mLs into the muscle once.   Yes Historical Provider, MD  levothyroxine (SYNTHROID, LEVOTHROID) 75 MCG tablet Take 1 tablet (75 mcg total) by mouth daily with breakfast. Patient taking differently: Take 75 mcg by mouth daily. At 4pm 08/08/12  Yes Cletus Gash, MD  loperamide (IMODIUM) 2 MG capsule Take 2 mg by mouth as needed for diarrhea or loose stools.   Yes Historical Provider, MD  magnesium hydroxide (MILK OF MAGNESIA) 400 MG/5ML suspension Take 30 mLs by mouth daily as needed for mild constipation.   Yes Historical Provider, MD  Melatonin 5 MG CAPS Take 5 mg by mouth daily at 8 pm.    Yes Historical Provider, MD  memantine (NAMENDA) 10 MG tablet Take 10 mg by mouth 2 (two) times daily.    Yes Historical Provider, MD  metoprolol succinate (TOPROL-XL) 100 MG 24 hr tablet Take 50 mg by mouth daily at 8 pm. Take with or immediately following a meal.   Yes Historical Provider, MD  Multiple Vitamin (MULTIVITAMIN WITH MINERALS)  TABS tablet Take 1 tablet by mouth daily at 8 pm.   Yes Historical Provider, MD  multivitamin-lutein (OCUVITE-LUTEIN) CAPS Take 1 capsule by mouth daily with breakfast.   Yes Historical Provider, MD  predniSONE (DELTASONE) 5 MG tablet Take 5 mg by mouth daily. 09/24/11  Yes Rowe Clack, MD  Sodium Phosphates (TGT SALINE LAXATIVE) ENEM Place 1 each rectally as needed (for constipaiton).   Yes Historical Provider, MD  trolamine salicylate (ANALGESIC CREAM/ALOE) 10 % cream Apply 1 application topically as needed for muscle pain.   Yes Historical Provider, MD  UNABLE TO FIND Take 240 mLs by mouth 3 (three) times daily. Give pt 238ml extra beverage of choice at every shift (6:30am, 2:30pm, 10:30pm)   Yes Historical Provider, MD  Vitamin D, Ergocalciferol, (DRISDOL) 50000 UNITS CAPS Take 50,000 Units by mouth every 30 (thirty) days.   Yes Historical Provider, MD   BP 197/61 mmHg  Pulse 47  Temp(Src) 97.8 F (36.6 C) (Oral)  Resp 20  SpO2 100% Physical Exam  Constitutional: She appears well-developed and well-nourished.  HENT:  Head: Normocephalic and atraumatic.  Eyes: Conjunctivae are normal. Right eye exhibits no discharge. Left eye exhibits no discharge.  Neck: Normal range of motion. Neck supple.   Cardiovascular: Normal rate, regular rhythm and normal heart sounds.   Pulmonary/Chest: Effort normal and breath sounds normal. No respiratory distress.  Abdominal: Soft. There is no tenderness.  Neurological: She is alert.  Skin: Skin is warm and dry.  There is a 4cm x 4cm area of redness and warmth consistent with cellulitis of the left lateral ankle. Patient has a 1 cm diameter wound to the posterior distal calf with mild surrounding erythema. Base of wound is pink, no drainage.  Psychiatric: She has a normal mood and affect.  Nursing note and vitals reviewed.   ED Course  Procedures (including critical care time) Labs Review Labs Reviewed  BASIC METABOLIC PANEL - Abnormal; Notable for the following:    Glucose, Bld 113 (*)    BUN 32 (*)    GFR calc non Af Amer 58 (*)    GFR calc Af Amer 68 (*)    All other components within normal limits  CBC WITH DIFFERENTIAL - Abnormal; Notable for the following:    Monocytes Relative 16 (*)    Monocytes Absolute 1.1 (*)    All other components within normal limits    Imaging Review No results found.   EKG Interpretation None      Patient seen and examined. Work-up initiated. Medications ordered. Discussed with Dr. Canary Brim who has seen. Labs are reassuring. PO antibiotics given here. Will continue.   Encouraged PCP recheck in 48 hours. Prescription for Diflucan given if needed at request of family.  Pt urged to return with worsening pain, worsening swelling, expanding area of redness or streaking up extremity, fever, or any other concerns. Urged to take complete course of antibiotics as prescribed.  Pt verbalizes understanding and agrees with plan.   Vital signs reviewed and are as follows: BP 197/61 mmHg  Pulse 47  Temp(Src) 97.8 F (36.6 C) (Oral)  Resp 20  SpO2 100%   MDM   Final diagnoses:  Cellulitis of left lower extremity   Patient with cellulitis. No systemic symptoms of illness including fever. Labs are  reassuring with normal WBC count. No indications for admission. ABx started in ED.   No dangerous or life-threatening conditions suspected or identified by history, physical exam, and by work-up. No indications for  hospitalization identified.      Carlisle Cater, PA-C 04/01/14 1728

## 2014-04-01 NOTE — ED Notes (Signed)
[facility] contacted for transport 

## 2014-04-07 LAB — BASIC METABOLIC PANEL
BUN: 36 mg/dL — AB (ref 4–21)
Creatinine: 0.9 mg/dL (ref 0.5–1.1)
GLUCOSE: 91 mg/dL
Potassium: 4.3 mmol/L (ref 3.4–5.3)
Sodium: 143 mmol/L (ref 137–147)

## 2014-04-19 ENCOUNTER — Encounter: Payer: Self-pay | Admitting: Family Medicine

## 2014-04-19 ENCOUNTER — Non-Acute Institutional Stay: Payer: Medicare Other | Admitting: Family Medicine

## 2014-04-19 DIAGNOSIS — G4700 Insomnia, unspecified: Secondary | ICD-10-CM

## 2014-04-19 DIAGNOSIS — Z993 Dependence on wheelchair: Secondary | ICD-10-CM

## 2014-04-19 DIAGNOSIS — M069 Rheumatoid arthritis, unspecified: Secondary | ICD-10-CM

## 2014-04-19 DIAGNOSIS — F32A Depression, unspecified: Secondary | ICD-10-CM

## 2014-04-19 DIAGNOSIS — T148 Other injury of unspecified body region: Secondary | ICD-10-CM

## 2014-04-19 DIAGNOSIS — T148XXA Other injury of unspecified body region, initial encounter: Secondary | ICD-10-CM

## 2014-04-19 DIAGNOSIS — F329 Major depressive disorder, single episode, unspecified: Secondary | ICD-10-CM

## 2014-04-19 DIAGNOSIS — I10 Essential (primary) hypertension: Secondary | ICD-10-CM

## 2014-04-19 DIAGNOSIS — E039 Hypothyroidism, unspecified: Secondary | ICD-10-CM

## 2014-04-19 DIAGNOSIS — M1990 Unspecified osteoarthritis, unspecified site: Secondary | ICD-10-CM

## 2014-04-19 DIAGNOSIS — F0150 Vascular dementia without behavioral disturbance: Secondary | ICD-10-CM

## 2014-04-19 LAB — URIC ACID
CALCIUM: 9.1 mg/dL
Uric Acid: 3 mg/dl

## 2014-04-19 MED ORDER — VITAMIN E 180 MG (400 UNIT) PO CAPS: 400.0000 [IU] | ORAL_CAPSULE | Freq: Every day | ORAL | Status: DC

## 2014-04-19 NOTE — Assessment & Plan Note (Signed)
Controlled No red flags Continue metoprolol

## 2014-04-19 NOTE — Assessment & Plan Note (Signed)
Normal affect, unclear with dementia Continue cymbalta which also has benefit with chronic OA, RA, and gouty pain

## 2014-04-19 NOTE — Assessment & Plan Note (Signed)
Continued, Easy mobility in wheelchair using her feet. Repeated bumping of her legs

## 2014-04-19 NOTE — Assessment & Plan Note (Signed)
Aymptomatic, stable Continue synthroid Consider TSH

## 2014-04-19 NOTE — Progress Notes (Signed)
Patient ID: Connie Fitzpatrick, female   DOB: 06-Aug-1927, 80 y.o.   MRN: [id number]   HPI  Patient Seen in NH for regulatory visit.   She state sthat she is doing well and doesn't have any complaints currently. She states that she "got choked up" at lunch but is fine now. She denies pain, dyspnea, and chest pain.   She doesn't recall having cellulitis and states her sore on her leg is doing well.   I have reviewed and updated her med list in detail, only Vitamin E was added to epic as it is being given in the NH   ROS: Per HPI  Objective: BP 124/59 mmHg  Pulse 67  Temp(Src) 97.8 F (36.6 C)  Resp 20 Gen: NAD, alert, cooperative with exam HEENT: NCAT, MMM CV: RRR, good S1/S2, no murmur Resp: CTABL, no wheezes, non-labored Ext: No edema, LLE with dry intact bandage in place to mid calf with small circular spot of brown dry drainage apparent under the tape on her posterior calf measuring approx 1 cm in diameter    L lower arm with tophus still present unchanged from previous exam, non tender to palp Neuro: Alert and oriented to person and month but not to place or year.   Assessment and plan:  Wheelchair dependent Continued, Easy mobility in wheelchair using her feet. Repeated bumping of her legs  Rheumatoid arthritis Stable Continue chronic prednisone  Osteoarthritis Chronic, stable Continue tylenol  Insomnia No complaints, continue melatonin  Hypothyroidism Aymptomatic, stable Continue synthroid Consider TSH  Essential hypertension Controlled No red flags Continue metoprolol  Depression Normal affect, unclear with dementia Continue cymbalta which also has benefit with chronic OA, RA, and gouty pain  Dementia, vascular Stable grossly, no MMSE today Continue namenda and aricept- no apparent side effects   Abrasion Previous wound presumably healed Now wound on LLE calf, s/p course of Keflex for cellulitis WOC addressing wound care Continue to  monitor     Meds ordered this encounter  Medications  . vitamin E (VITAMIN E) 400 UNIT capsule    Sig: Take 1 capsule (400 Units total) by mouth daily.    Dispense:  30 capsule    Refill:  11

## 2014-04-19 NOTE — Assessment & Plan Note (Signed)
Stable grossly, no MMSE today Continue namenda and aricept- no apparent side effects

## 2014-04-19 NOTE — Assessment & Plan Note (Signed)
No complaints, continue melatonin

## 2014-04-19 NOTE — Assessment & Plan Note (Signed)
Chronic, stable Continue tylenol

## 2014-04-19 NOTE — Assessment & Plan Note (Signed)
Previous wound presumably healed Now wound on LLE calf, s/p course of Keflex for cellulitis WOC addressing wound care Continue to monitor

## 2014-04-19 NOTE — Assessment & Plan Note (Signed)
Stable Continue chronic prednisone

## 2014-04-20 ENCOUNTER — Encounter: Payer: Self-pay | Admitting: Pharmacist

## 2014-05-02 ENCOUNTER — Encounter: Payer: Self-pay | Admitting: Family Medicine

## 2014-05-02 ENCOUNTER — Emergency Department (HOSPITAL_COMMUNITY): Payer: Medicare Other

## 2014-05-02 ENCOUNTER — Non-Acute Institutional Stay: Payer: Medicare Other | Admitting: Family Medicine

## 2014-05-02 ENCOUNTER — Encounter (HOSPITAL_COMMUNITY): Payer: Self-pay | Admitting: *Deleted

## 2014-05-02 ENCOUNTER — Emergency Department (HOSPITAL_COMMUNITY)
Admission: EM | Admit: 2014-05-02 | Discharge: 2014-05-02 | Disposition: A | Discharge status: Home / Self Care | Payer: Medicare Other | Attending: Emergency Medicine | Admitting: Emergency Medicine

## 2014-05-02 DIAGNOSIS — Z8719 Personal history of other diseases of the digestive system: Secondary | ICD-10-CM | POA: Insufficient documentation

## 2014-05-02 DIAGNOSIS — Z79899 Other long term (current) drug therapy: Secondary | ICD-10-CM | POA: Diagnosis not present

## 2014-05-02 DIAGNOSIS — Z7952 Long term (current) use of systemic steroids: Secondary | ICD-10-CM | POA: Insufficient documentation

## 2014-05-02 DIAGNOSIS — Y9289 Other specified places as the place of occurrence of the external cause: Secondary | ICD-10-CM | POA: Insufficient documentation

## 2014-05-02 DIAGNOSIS — I10 Essential (primary) hypertension: Secondary | ICD-10-CM | POA: Insufficient documentation

## 2014-05-02 DIAGNOSIS — S0990XA Unspecified injury of head, initial encounter: Secondary | ICD-10-CM | POA: Diagnosis present

## 2014-05-02 DIAGNOSIS — M1990 Unspecified osteoarthritis, unspecified site: Secondary | ICD-10-CM | POA: Insufficient documentation

## 2014-05-02 DIAGNOSIS — W19XXXA Unspecified fall, initial encounter: Secondary | ICD-10-CM | POA: Insufficient documentation

## 2014-05-02 DIAGNOSIS — F329 Major depressive disorder, single episode, unspecified: Secondary | ICD-10-CM | POA: Diagnosis not present

## 2014-05-02 DIAGNOSIS — J45909 Unspecified asthma, uncomplicated: Secondary | ICD-10-CM | POA: Insufficient documentation

## 2014-05-02 DIAGNOSIS — F0390 Unspecified dementia without behavioral disturbance: Secondary | ICD-10-CM | POA: Diagnosis not present

## 2014-05-02 DIAGNOSIS — S0993XA Unspecified injury of face, initial encounter: Secondary | ICD-10-CM

## 2014-05-02 DIAGNOSIS — Y9389 Activity, other specified: Secondary | ICD-10-CM | POA: Insufficient documentation

## 2014-05-02 DIAGNOSIS — S0003XA Contusion of scalp, initial encounter: Secondary | ICD-10-CM

## 2014-05-02 DIAGNOSIS — M109 Gout, unspecified: Secondary | ICD-10-CM | POA: Diagnosis not present

## 2014-05-02 DIAGNOSIS — E039 Hypothyroidism, unspecified: Secondary | ICD-10-CM | POA: Insufficient documentation

## 2014-05-02 DIAGNOSIS — G8929 Other chronic pain: Secondary | ICD-10-CM | POA: Diagnosis not present

## 2014-05-02 DIAGNOSIS — Z87828 Personal history of other (healed) physical injury and trauma: Secondary | ICD-10-CM | POA: Diagnosis not present

## 2014-05-02 DIAGNOSIS — Y998 Other external cause status: Secondary | ICD-10-CM | POA: Diagnosis not present

## 2014-05-02 LAB — CBC
HCT: 42.3 % (ref 36.0–46.0)
Hemoglobin: 13.2 g/dL (ref 12.0–15.0)
MCH: 30.8 pg (ref 26.0–34.0)
MCHC: 31.2 g/dL (ref 30.0–36.0)
MCV: 98.6 fL (ref 78.0–100.0)
Platelets: 236 10*3/uL (ref 150–400)
RBC: 4.29 MIL/uL (ref 3.87–5.11)
RDW: 13.4 % (ref 11.5–15.5)
WBC: 10.7 10*3/uL — AB (ref 4.0–10.5)

## 2014-05-02 LAB — URINALYSIS, ROUTINE W REFLEX MICROSCOPIC
BILIRUBIN URINE: NEGATIVE
Glucose, UA: NEGATIVE mg/dL
Hgb urine dipstick: NEGATIVE
Ketones, ur: NEGATIVE mg/dL
Leukocytes, UA: NEGATIVE
NITRITE: NEGATIVE
PH: 5.5 (ref 5.0–8.0)
Protein, ur: NEGATIVE mg/dL
SPECIFIC GRAVITY, URINE: 1.015 (ref 1.005–1.030)
UROBILINOGEN UA: 0.2 mg/dL (ref 0.0–1.0)

## 2014-05-02 LAB — BASIC METABOLIC PANEL
Anion gap: 14 (ref 5–15)
BUN: 30 mg/dL — ABNORMAL HIGH (ref 6–23)
CHLORIDE: 100 meq/L (ref 96–112)
CO2: 26 meq/L (ref 19–32)
CREATININE: 0.83 mg/dL (ref 0.50–1.10)
Calcium: 9.8 mg/dL (ref 8.4–10.5)
GFR calc non Af Amer: 63 mL/min — ABNORMAL LOW (ref >90)
GFR, EST AFRICAN AMERICAN: 73 mL/min — AB (ref >90)
Glucose, Bld: 94 mg/dL (ref 70–99)
POTASSIUM: 5 meq/L (ref 3.7–5.3)
Sodium: 140 mEq/L (ref 137–147)

## 2014-05-02 LAB — I-STAT TROPONIN, ED: TROPONIN I, POC: 0.01 ng/mL (ref 0.00–0.08)

## 2014-05-02 NOTE — Progress Notes (Signed)
Patient ID: Connie Fitzpatrick, female   DOB: 10-29-1927, 80 y.o.   MRN: [id number] I discussed with  Dr Wendi Snipes.  I agree with their plans documented in their regulatory visit note.

## 2014-05-02 NOTE — ED Notes (Signed)
Spoke with Princess, staff member at [facility] reports pt did not receive pt's blood meds as she takes metoprolol 24 tab.

## 2014-05-02 NOTE — ED Provider Notes (Signed)
CSN: [id number]     Arrival date & time 05/02/14  1043 History   First MD Initiated Contact with Patient 05/02/14 1044     Chief Complaint  Patient presents with  . Fall     (Consider location/radiation/quality/duration/timing/severity/associated sxs/prior Treatment) HPI Comments: Sent from [place] [facility] for unwitnessed fall. Per EMS, patient acting like herself per doctors there, however family is concerned for AMS. She's not been participating in as many activities. Hx of SDH due to a fall 7 years ago.  Patient is a 80 y.o. female presenting with fall. The history is provided by the patient.  Fall This is a recurrent problem. The current episode started more than 2 days ago. The problem occurs every several days. The problem has been resolved. Associated symptoms include headaches. Pertinent negatives include no chest pain, no abdominal pain and no shortness of breath. Nothing aggravates the symptoms. Nothing relieves the symptoms.    Past Medical History  Diagnosis Date  . Gout   . INSOMNIA   . ALLERGIC RHINITIS   . ASTHMA   . OSTEOARTHRITIS   . Rheumatoid arthritis(714.0)   . HYPERTENSION   . HYPERLIPIDEMIA   . GERD (gastroesophageal reflux disease)   . Depression   . ALCOHOL ABUSE     hx detox, dry since 2010  . HYPOTHYROIDISM   . URINARY INCONTINENCE   . Dementia   . Recurrent falls   . SDH (subdural hematoma)     a. 2008 in setting of fall.  . Chronic pain   . Perforated small intestine     a. From swallowed foreign body - s/p exlap with small bowel resection 09/2011.  Marland Kitchen Dysphagia, oropharyngeal 12/15/2013   Past Surgical History  Procedure Laterality Date  . Abdominal hysterectomy    . Breast surgery    . Total knee arthroplasty    . Vaginal prolapse repair    . Total knee arthroplasty  07/2002    right  . Total knee arthroplasty  09/1998    Left  . Cataract extraction  2001  . L-spine  1998  . Colpocleisis vaginal [place]  06/2010  . Laparotomy  10/26/2011   Procedure: EXPLORATORY LAPAROTOMY;  Surgeon: Imogene Burn. Georgette Dover, MD;  Location: [facility];  Service: General;  Laterality: N/A;  . Bowel resection  10/26/2011    Procedure: SMALL BOWEL RESECTION;  Surgeon: Imogene Burn. Georgette Dover, MD;  Location: [facility];  Service: General;  Laterality: N/A;   Family History  Problem Relation Age of Onset  . Arthritis Mother   . Diabetes Mother   . Hypertension Mother   . Lung cancer Father   . Stroke Father    History  Substance Use Topics  . Smoking status: Never Smoker   . Smokeless tobacco: Not on file  . Alcohol Use: No     Comment: Previous hx of alcohol abuse   OB History    No data available     Review of Systems  Unable to perform ROS: Dementia  Respiratory: Negative for shortness of breath.   Cardiovascular: Negative for chest pain.  Gastrointestinal: Negative for abdominal pain.  Neurological: Positive for headaches.      Allergies  Enalapril maleate; Azithromycin; Morphine and related; Nsaids; Valacyclovir hcl; and Allopurinol  Home Medications   Prior to Admission medications   Medication Sig Start Date End Date Taking? Authorizing Provider  acetaminophen (TYLENOL) 650 MG CR tablet Take 650 mg by mouth 2 (two) times daily.  11/08/13   Josalyn C Funches,  MD  albuterol (PROVENTIL HFA;VENTOLIN HFA) 108 (90 BASE) MCG/ACT inhaler Inhale 2 puffs into the lungs every 4 (four) hours as needed. For shortness of breath 06/03/12   Candelaria Celeste, MD  Alum & Mag Hydroxide-Simeth (MAGIC MOUTHWASH) SOLN Take 5 mLs by mouth See admin instructions. Take 1 - 24ml daily AND as needed with PRN meds at 8pm    Historical Provider, MD  Amino Acids-Protein Hydrolys (FEEDING SUPPLEMENT, PRO-STAT SUGAR FREE 64,) LIQD Take 30 mLs by mouth daily.    Historical Provider, MD  bisacodyl (DULCOLAX) 5 MG EC tablet Take 5 mg by mouth daily as needed (for constipation).     Historical Provider, MD  calcium citrate (CALCITRATE - DOSED IN MG ELEMENTAL CALCIUM) 950 MG tablet Take 400 mg  of elemental calcium by mouth daily.    Historical Provider, MD  cetirizine (ZYRTEC) 10 MG tablet Take 1 tablet (10 mg total) by mouth daily. 12/20/13   Hilton Sinclair, MD  donepezil (ARICEPT) 10 MG tablet Take 10 mg by mouth daily with breakfast.    Historical Provider, MD  DULoxetine (CYMBALTA) 30 MG capsule Take 30 mg by mouth daily at 8 pm.     Historical Provider, MD  ENSURE (ENSURE) Take 237 mLs by mouth daily before lunch.     Historical Provider, MD  febuxostat (ULORIC) 40 MG tablet Take 40 mg by mouth daily.    Historical Provider, MD  gabapentin (NEURONTIN) 300 MG capsule Take 300 mg by mouth 2 (two) times daily.    Historical Provider, MD  guaifenesin (ROBITUSSIN) 100 MG/5ML syrup Take 200 mg by mouth every 4 (four) hours as needed for cough.    Historical Provider, MD  Influenza vac split quadrivalent PF (FLUARIX QUADRIVALENT) 0.5 ML injection Inject 0.5 mLs into the muscle once.    Historical Provider, MD  levothyroxine (SYNTHROID, LEVOTHROID) 75 MCG tablet Take 1 tablet (75 mcg total) by mouth daily with breakfast. Patient taking differently: Take 75 mcg by mouth daily. At 4pm 08/08/12   Cletus Gash, MD  loperamide (IMODIUM) 2 MG capsule Take 2 mg by mouth as needed for diarrhea or loose stools.    Historical Provider, MD  Melatonin 5 MG CAPS Take 5 mg by mouth daily at 8 pm.     Historical Provider, MD  memantine (NAMENDA) 10 MG tablet Take 10 mg by mouth 2 (two) times daily.     Historical Provider, MD  metoprolol succinate (TOPROL-XL) 100 MG 24 hr tablet Take 50 mg by mouth daily at 8 pm. Take with or immediately following a meal.    Historical Provider, MD  Multiple Vitamin (MULTIVITAMIN WITH MINERALS) TABS tablet Take 1 tablet by mouth daily at 8 pm.    Historical Provider, MD  multivitamin-lutein (OCUVITE-LUTEIN) CAPS Take 1 capsule by mouth daily with breakfast.    Historical Provider, MD  predniSONE (DELTASONE) 5 MG tablet Take 5 mg by mouth daily. 09/24/11   Rowe Clack, MD  trolamine salicylate (ANALGESIC CREAM/ALOE) 10 % cream Apply 1 application topically as needed for muscle pain.    Historical Provider, MD  Vitamin D, Ergocalciferol, (DRISDOL) 50000 UNITS CAPS Take 50,000 Units by mouth every 30 (thirty) days.    Historical Provider, MD  vitamin E (VITAMIN E) 400 UNIT capsule Take 1 capsule (400 Units total) by mouth daily. 04/19/14   Timmothy Euler, MD   BP 168/89 mmHg  Pulse 73  Temp(Src) 97.6 F (36.4 C) (Oral)  Resp 19  SpO2 97% Physical  Exam  Constitutional: She appears well-developed and well-nourished. No distress.  HENT:  Head: Normocephalic.    Mouth/Throat: Oropharynx is clear and moist.  Eyes: EOM are normal. Pupils are equal, round, and reactive to light.  Neck: Normal range of motion. Neck supple.  Cardiovascular: Normal rate and regular rhythm.  Exam reveals no friction rub.   No murmur heard. Pulmonary/Chest: Effort normal and breath sounds normal. No respiratory distress. She has no wheezes. She has no rales.  Abdominal: Soft. She exhibits no distension. There is no tenderness. There is no rebound.  Musculoskeletal: Normal range of motion. She exhibits no edema.  Neurological: She is alert. No cranial nerve deficit. She exhibits normal muscle tone. Coordination normal.  Skin: No rash noted. She is not diaphoretic.  Nursing note and vitals reviewed.   ED Course  Procedures (including critical care time) Labs Review Labs Reviewed  CBC  BASIC METABOLIC PANEL  URINALYSIS, ROUTINE W REFLEX MICROSCOPIC  I-STAT [place], ED    Imaging Review Dg Chest 2 View  05/02/2014   CLINICAL DATA:  Status post fall.  EXAM: CHEST  2 VIEW  COMPARISON:  PA and lateral chest 07/11/2012.  FINDINGS: The lungs are clear. Heart size is upper normal. No pneumothorax or pleural effusion. Marked scoliosis and severe degenerative change about the shoulders is noted. No acute disease.  IMPRESSION: No active cardiopulmonary disease.    Electronically Signed   By: Inge Rise M.D.   On: 05/02/2014 13:09   Ct Head Wo Contrast  05/02/2014   CLINICAL DATA:  Altered mental status, facial bruising after fall. Unknown if loss of consciousness occurred.  EXAM: CT HEAD WITHOUT CONTRAST  CT MAXILLOFACIAL WITHOUT CONTRAST  CT CERVICAL SPINE WITHOUT CONTRAST  TECHNIQUE: Multidetector CT imaging of the head, cervical spine, and maxillofacial structures were performed using the standard protocol without intravenous contrast. Multiplanar CT image reconstructions of the cervical spine and maxillofacial structures were also generated.  COMPARISON:  CT scan of November 23, 2012.  FINDINGS: CT HEAD FINDINGS  Bony calvarium appears intact. Mild diffuse cortical atrophy. Moderate chronic ischemic white matter disease. Old lacunar infarction in left basal ganglia is noted. No mass effect or midline shift is noted. Ventricular size is within normal limits. There is no evidence of mass lesion, intracranial hemorrhage or acute infarction. Small left frontal scalp hematoma is noted.  CT MAXILLOFACIAL FINDINGS  Old nasal bone fracture deviated to the right is again noted and unchanged compared to prior exam. No acute fracture or other bony abnormality is noted. Right sphenoid sinusitis is noted. Globes and orbits appear normal. Pterygoid plates appear normal.  CT CERVICAL SPINE FINDINGS  No fracture or significant spondylolisthesis is noted. Severe degenerative disc disease is noted at C3-4, C4-5, C5-6 and C6-7. Erosive degenerative changes are again noted involving the odontoid process which are unchanged compared to prior exam. Degenerative changes seen involving the posterior facet joints at multiple levels posteriorly.  IMPRESSION: Small left frontal scalp hematoma. Mild diffuse cortical atrophy. Moderate chronic ischemic white matter disease. No acute intracranial abnormality seen.  Old nasal bone fractures are noted which are deviated to the right. Mild right  sphenoid sinusitis is noted. No other significant abnormality seen in the maxillofacial region.  Multilevel severe degenerative disc disease is noted in the cervical spine. No acute fracture is noted.   Electronically Signed   By: Sabino Dick M.D.   On: 05/02/2014 12:28   Ct Cervical Spine Wo Contrast  05/02/2014   CLINICAL DATA:  Altered mental status, facial bruising after fall. Unknown if loss of consciousness occurred.  EXAM: CT HEAD WITHOUT CONTRAST  CT MAXILLOFACIAL WITHOUT CONTRAST  CT CERVICAL SPINE WITHOUT CONTRAST  TECHNIQUE: Multidetector CT imaging of the head, cervical spine, and maxillofacial structures were performed using the standard protocol without intravenous contrast. Multiplanar CT image reconstructions of the cervical spine and maxillofacial structures were also generated.  COMPARISON:  CT scan of November 23, 2012.  FINDINGS: CT HEAD FINDINGS  Bony calvarium appears intact. Mild diffuse cortical atrophy. Moderate chronic ischemic white matter disease. Old lacunar infarction in left basal ganglia is noted. No mass effect or midline shift is noted. Ventricular size is within normal limits. There is no evidence of mass lesion, intracranial hemorrhage or acute infarction. Small left frontal scalp hematoma is noted.  CT MAXILLOFACIAL FINDINGS  Old nasal bone fracture deviated to the right is again noted and unchanged compared to prior exam. No acute fracture or other bony abnormality is noted. Right sphenoid sinusitis is noted. Globes and orbits appear normal. Pterygoid plates appear normal.  CT CERVICAL SPINE FINDINGS  No fracture or significant spondylolisthesis is noted. Severe degenerative disc disease is noted at C3-4, C4-5, C5-6 and C6-7. Erosive degenerative changes are again noted involving the odontoid process which are unchanged compared to prior exam. Degenerative changes seen involving the posterior facet joints at multiple levels posteriorly.  IMPRESSION: Small left frontal scalp  hematoma. Mild diffuse cortical atrophy. Moderate chronic ischemic white matter disease. No acute intracranial abnormality seen.  Old nasal bone fractures are noted which are deviated to the right. Mild right sphenoid sinusitis is noted. No other significant abnormality seen in the maxillofacial region.  Multilevel severe degenerative disc disease is noted in the cervical spine. No acute fracture is noted.   Electronically Signed   By: Sabino Dick M.D.   On: 05/02/2014 12:28   Ct Maxillofacial Wo Cm  05/02/2014   CLINICAL DATA:  Altered mental status, facial bruising after fall. Unknown if loss of consciousness occurred.  EXAM: CT HEAD WITHOUT CONTRAST  CT MAXILLOFACIAL WITHOUT CONTRAST  CT CERVICAL SPINE WITHOUT CONTRAST  TECHNIQUE: Multidetector CT imaging of the head, cervical spine, and maxillofacial structures were performed using the standard protocol without intravenous contrast. Multiplanar CT image reconstructions of the cervical spine and maxillofacial structures were also generated.  COMPARISON:  CT scan of November 23, 2012.  FINDINGS: CT HEAD FINDINGS  Bony calvarium appears intact. Mild diffuse cortical atrophy. Moderate chronic ischemic white matter disease. Old lacunar infarction in left basal ganglia is noted. No mass effect or midline shift is noted. Ventricular size is within normal limits. There is no evidence of mass lesion, intracranial hemorrhage or acute infarction. Small left frontal scalp hematoma is noted.  CT MAXILLOFACIAL FINDINGS  Old nasal bone fracture deviated to the right is again noted and unchanged compared to prior exam. No acute fracture or other bony abnormality is noted. Right sphenoid sinusitis is noted. Globes and orbits appear normal. Pterygoid plates appear normal.  CT CERVICAL SPINE FINDINGS  No fracture or significant spondylolisthesis is noted. Severe degenerative disc disease is noted at C3-4, C4-5, C5-6 and C6-7. Erosive degenerative changes are again noted involving  the odontoid process which are unchanged compared to prior exam. Degenerative changes seen involving the posterior facet joints at multiple levels posteriorly.  IMPRESSION: Small left frontal scalp hematoma. Mild diffuse cortical atrophy. Moderate chronic ischemic white matter disease. No acute intracranial abnormality seen.  Old nasal bone fractures are noted which  are deviated to the right. Mild right sphenoid sinusitis is noted. No other significant abnormality seen in the maxillofacial region.  Multilevel severe degenerative disc disease is noted in the cervical spine. No acute fracture is noted.   Electronically Signed   By: Sabino Dick M.D.   On: 05/02/2014 12:28     EKG Interpretation   Date/Time:  Wednesday May 02 2014 10:43:41 EST Ventricular Rate:  70 PR Interval:  158 QRS Duration: 89 QT Interval:  436 QTC Calculation: 470 R Axis:   -13 Text Interpretation:  Sinus rhythm Abnormal R-wave progression, late  transition Inferior infarct, old T wave changes anteriorl similar to prior  Confirmed by Mingo Amber  MD, [place] [phone]) on 05/02/2014 11:18:52 AM      MDM   Final diagnoses:  Fall  Scalp hematoma, initial encounter    63F here for evaluation for fall 5 days ago. Hx of traumatic SDH from a fall. Not on anticoagulants. Alert here, answering questions, but disoriented. Bruising on forehead and R face. No hemotympanum on left, R ear impacted with cerumen. Moving all extremities, no cranial nerves deficits. Large skin tear on L anterior shin. Will CT, get labs. CTs ok. Labs ok. Stable for discharge.  Evelina Bucy, MD 05/02/14 [phone]

## 2014-05-02 NOTE — ED Notes (Signed)
MD made aware of pt's BP. Reports pt can discharge. Called [facility] to transport to [place].

## 2014-05-02 NOTE — ED Notes (Signed)
Patient transported to CT 

## 2014-05-02 NOTE — Progress Notes (Signed)
Patient ID: Connie Fitzpatrick, female   DOB: 09-18-1927, 80 y.o.   MRN: [id number] I discussed with  Dr Wendi Snipes.  I agree with their plans documented in their regulatory visit note.

## 2014-05-02 NOTE — ED Notes (Signed)
Per [facility]- pt is from [place]. Pt had a fall on Friday and it is unknown if she lost consciousness. and was reported by family to be altered. Pt noted to have bruising to face.

## 2014-05-02 NOTE — Addendum Note (Signed)
Addended byWendy Poet, Daylee Delahoz D on: 05/02/2014 12:04 PM   Modules accepted: Level of Service

## 2014-05-02 NOTE — ED Notes (Signed)
Patient transported to X-ray 

## 2014-05-02 NOTE — ED Notes (Signed)
PLACED CALL TO [facility] FOR TRANSPORT TO [place]

## 2014-05-02 NOTE — Discharge Instructions (Signed)

## 2014-05-02 NOTE — Progress Notes (Signed)
Patient ID: Connie Fitzpatrick, female   DOB: Oct 18, 1927, 80 y.o.   MRN: [id number] [place]  Visit  Provider: Kenn File, MD Location of Care: [facility] Visit Information: an urgent visit for a new problem Patient accompanied by patient Information for visit from patient, medical record, nursing and Director of Nursing.  Dr Dez Stauffer spoke by phone this morning with patient's husband, Kalayah Leske at [phone] about his wife's condition and our recommendations.  Chief Complaint:  Chief Complaint  Patient presents with  . Fall  . Facial Injury   HISTORY OF PRESENT ILLNESS: FALL Date of Event: 04/27/14 around 5pm Location: In patient's room at [place] prior to fall: leaning forward from wheelchair to pick up item from floor.  Pt lost balance, feel forward and struck face / head.  Pt got up on her own from fall back into wheelchair.   Change in position prior to fall: yes, leaning forward from seated position.  Dizziness prior to fall:  no Syncope prior to fall:  no Vertigo:  no Chest Pain/Palpitations:  no Loss of balance:   yes Slip: no Trip: no Slide:  no Injury: yes, bruising of face / right forehead Associated acute Illness no Medications (new or dose changes):  no  Vision Difficulty:  no Independent in ADLs: no. Wheelchair dependent  Independent in I-ADLs: no  Hx of falls and falls evaluation: no Hx of orthostatic dizziness: yes Hx of problems with legs (strength, pain):  yes Hx of problems with feet: yes Hx of urgency incontinence:  yes Hx of problems with balance: yes Hx of problems with Gait: yes   Hx of Parkinsons Disease: no  Hx of Strokes: yes Hx of Cardiac Disease:  no Hx of Cogntive Impairment: yes  Hx problems with transfers:  yes Hx of WC dependence:  yes   She  take medications to fall asleep of melatonin  Patient on chronic prednisone for Rheumatoid Arthritis      Outpatient Encounter Prescriptions as of  05/02/2014  Medication Sig  . acetaminophen (TYLENOL) 650 MG CR tablet Take 650 mg by mouth 2 (two) times daily.   Marland Kitchen albuterol (PROVENTIL HFA;VENTOLIN HFA) 108 (90 BASE) MCG/ACT inhaler Inhale 2 puffs into the lungs every 4 (four) hours as needed. For shortness of breath  . Alum & Mag Hydroxide-Simeth (MAGIC MOUTHWASH) SOLN Take 5 mLs by mouth See admin instructions. Take 1 - 45ml daily AND as needed with PRN meds at 8pm  . Amino Acids-Protein Hydrolys (FEEDING SUPPLEMENT, PRO-STAT SUGAR FREE 64,) LIQD Take 30 mLs by mouth daily.  . bisacodyl (DULCOLAX) 5 MG EC tablet Take 5 mg by mouth daily as needed (for constipation).   . calcium citrate (CALCITRATE - DOSED IN MG ELEMENTAL CALCIUM) 950 MG tablet Take 400 mg of elemental calcium by mouth daily.  . cetirizine (ZYRTEC) 10 MG tablet Take 1 tablet (10 mg total) by mouth daily.  Marland Kitchen donepezil (ARICEPT) 10 MG tablet Take 10 mg by mouth daily with breakfast.  . DULoxetine (CYMBALTA) 30 MG capsule Take 30 mg by mouth daily at 8 pm.   . ENSURE (ENSURE) Take 237 mLs by mouth daily before lunch.   . febuxostat (ULORIC) 40 MG tablet Take 40 mg by mouth daily.  Marland Kitchen gabapentin (NEURONTIN) 300 MG capsule Take 300 mg by mouth 2 (two) times daily.  Marland Kitchen guaifenesin (ROBITUSSIN) 100 MG/5ML syrup Take 200 mg by mouth every 4 (four) hours as needed for cough.  . Influenza vac split quadrivalent  PF (FLUARIX QUADRIVALENT) 0.5 ML injection Inject 0.5 mLs into the muscle once.  Marland Kitchen levothyroxine (SYNTHROID, LEVOTHROID) 75 MCG tablet Take 1 tablet (75 mcg total) by mouth daily with breakfast. (Patient taking differently: Take 75 mcg by mouth daily. At 4pm)  . loperamide (IMODIUM) 2 MG capsule Take 2 mg by mouth as needed for diarrhea or loose stools.  . Melatonin 5 MG CAPS Take 5 mg by mouth daily at 8 pm.   . memantine (NAMENDA) 10 MG tablet Take 10 mg by mouth 2 (two) times daily.   . metoprolol succinate (TOPROL-XL) 100 MG 24 hr tablet Take 50 mg by mouth daily at 8 pm. Take  with or immediately following a meal.  . Multiple Vitamin (MULTIVITAMIN WITH MINERALS) TABS tablet Take 1 tablet by mouth daily at 8 pm.  . multivitamin-lutein (OCUVITE-LUTEIN) CAPS Take 1 capsule by mouth daily with breakfast.  . predniSONE (DELTASONE) 5 MG tablet Take 5 mg by mouth daily.  Marland Kitchen trolamine salicylate (ANALGESIC CREAM/ALOE) 10 % cream Apply 1 application topically as needed for muscle pain.  . Vitamin D, Ergocalciferol, (DRISDOL) 50000 UNITS CAPS Take 50,000 Units by mouth every 30 (thirty) days.  . vitamin E (VITAMIN E) 400 UNIT capsule Take 1 capsule (400 Units total) by mouth daily.   History Patient Active Problem List   Diagnosis Date Noted  . Abrasion 02/02/2014  . Dysphagia, oropharyngeal 12/15/2013  . Frequent falls 09/01/2013  . Neuropathy of both feet 07/12/2013  . Nursing home resident 05/10/2013  . Orthostatic hypotension 07/28/2012  . Trigger finger, acquired 07/12/2012  . Wheelchair dependent 06/29/2012  . Dementia, vascular 05/02/2012  . Gout synovitis   . Hypothyroidism 08/13/2010  . Rheumatoid arthritis 08/13/2010  . HYPERLIPIDEMIA 08/11/2010  . Essential hypertension 08/11/2010  . ALLERGIC RHINITIS 08/11/2010  . ASTHMA 08/11/2010  . GERD 08/11/2010  . URINARY INCONTINENCE 08/11/2010  . History of alcohol abuse 08/04/2010  . Depression 08/04/2010  . Osteoarthritis 08/04/2010  . Insomnia 08/04/2010  . KNEE REPLACEMENT, BILATERAL, HX OF 08/04/2010   Past Medical History  Diagnosis Date  . Gout   . INSOMNIA   . ALLERGIC RHINITIS   . ASTHMA   . OSTEOARTHRITIS   . Rheumatoid arthritis(714.0)   . HYPERTENSION   . HYPERLIPIDEMIA   . GERD (gastroesophageal reflux disease)   . Depression   . ALCOHOL ABUSE     hx detox, dry since 2010  . HYPOTHYROIDISM   . URINARY INCONTINENCE   . Dementia   . Recurrent falls   . SDH (subdural hematoma)     a. 2008 in setting of fall.  . Chronic pain   . Perforated small intestine     a. From swallowed  foreign body - s/p exlap with small bowel resection 09/2011.  Marland Kitchen Dysphagia, oropharyngeal 12/15/2013   Past Surgical History  Procedure Laterality Date  . Abdominal hysterectomy    . Breast surgery    . Total knee arthroplasty    . Vaginal prolapse repair    . Total knee arthroplasty  07/2002    right  . Total knee arthroplasty  09/1998    Left  . Cataract extraction  2001  . L-spine  1998  . Colpocleisis vaginal le fort  06/2010  . Laparotomy  10/26/2011    Procedure: EXPLORATORY LAPAROTOMY;  Surgeon: Imogene Burn. Georgette Dover, MD;  Location: [place];  Service: General;  Laterality: N/A;  . Bowel resection  10/26/2011    Procedure: SMALL BOWEL RESECTION;  Surgeon: Imogene Burn.  Georgette Dover, MD;  Location: [facility];  Service: General;  Laterality: N/A;   Family History  Problem Relation Age of Onset  . Arthritis Mother   . Diabetes Mother   . Hypertension Mother   . Lung cancer Father   . Stroke Father     reports that she has never smoked. She does not have any smokeless tobacco history on file. She reports that she does not drink alcohol or use illicit drugs.   Falls in the past six months:   04/27/14   Review of Systems  See HPI Musculoskeletal: chroinc hand pain Skin: Denies skin rash or ulcers. Neurologic: Denies transient paralysis, weakness, paresthesias, (+) mild headache (bifrontal).  No neck pain except when moving rotating head to right .    PHYSICAL EXAM:. Wt Readings from Last 3 Encounters:  10/21/13 99 lb 12.8 oz (45.269 kg)  10/21/13 99 lb 12.8 oz (45.269 kg)  09/16/13 101 lb 12.8 oz (46.176 kg)   Temp Readings from Last 3 Encounters:  04/07/14 97.8 F (36.6 C)   04/01/14 97.9 F (36.6 C) Oral  02/12/14 96.2 F (35.7 C)    BP Readings from Last 3 Encounters:  05/02/14 140/75  04/07/14 124/59  04/01/14 173/77   Pulse Readings from Last 3 Encounters:  05/02/14 68  04/07/14 67  04/01/14 50    General: No acute distress, thin, pleasant, clean, groomed, lying flat in  bed,  HEENT:  (+) Raccoon eyes Neck:  Negative Battle Sign. Supple, Full flexion and extension of neck.  Full lateral rotation ROM of neck bilaterally Pupils 2-3 mm bilaterally symmetric, slow direct light reflex.  Intact EOMI.  Full visual fields by confrontation.  CV:  RRR RESP: No resp distress or accessory muscle use.  Clear to ausc bilat. No wheezing, no rales, no rhonchi.  MSK:  No back pain, no joint pain.  No joint swelling or redness Right leg with two foam islands dressing in place. Gauze wrap dry and intact left leg.  Neurological Examination:  Cranial Nerves:  II - Visual fields full to finger confrontation at bedside. Pupils equal and reactive.  III, IV and VI - Extraocular movements intact. No nystagmus.  VII - Face symmetric.  VIII - Hearing intact bilaterally.  IX, X and XII - Tongue md position with normal lateral motions XI - Shoulder shrug is symmetric.  Motor Examination: Normal tone and bulk. Hand / finger deformities from RA Right Upper Extremity: 5/5 shoulder abdomen/add, 4+/5 elbow flex/ext. 4+/5 grip Left Upper Extremity: 5/5 shoulder abdomen/add, 4+/5 elbow flex/ext. Unable to assess secondary to deformity.  Right Lower Extremity: 5/5 hip flexion, 5/5 knee flex and ext, 5/5 ankle DF /PF.  Left Lower Extremity: 5/5 hip flexion, 5/5 knee flex and ext, 5/5 ankle DF /PF  Reflexes:  Right Babinski: downgoing. Left Babinski: downgoing.    Psych:  Orientation to self, limited orientation to location. , Memory impaired for short term  MMSE or MoCa:  Moderate-to-severe Dementia  Assessment and Plan:   1. Facial and Head Trauma - Unwitnessed fall 04/27/14. Multifactorial though imbalance when leaning from [facility] likely the predominant causal etiology.  - Family expressed concern that patient is not "acting like herself" since fall.  Pt's participation in daily activities is decreased. - Pt's overall cognition appears to be at baseline per undersigned past interactions with  patient.  - Pt is not on anticoagulants. - Would recommend assessing for traumatic intracranial hemorrhage and facial bone trauma urgently. To this end, recommended transport to  ED for evaluation.  Pt's husband agreed to the transfer to ED for Evaluation and treatment.  - Will need WC assessment once back at [facility] to see if seating modification is needed.    Code Status:  (+) DNR, May hospitalize.  Emergency contact:  Anayah Arvanitis (husband) [phone] (H), [phone] (m)

## 2014-05-14 ENCOUNTER — Non-Acute Institutional Stay: Payer: Medicare Other | Admitting: Family Medicine

## 2014-05-14 DIAGNOSIS — M1009 Idiopathic gout, multiple sites: Secondary | ICD-10-CM

## 2014-05-14 DIAGNOSIS — G5791 Unspecified mononeuropathy of right lower limb: Secondary | ICD-10-CM

## 2014-05-14 DIAGNOSIS — T148 Other injury of unspecified body region: Secondary | ICD-10-CM

## 2014-05-14 DIAGNOSIS — T148XXA Other injury of unspecified body region, initial encounter: Secondary | ICD-10-CM

## 2014-05-14 DIAGNOSIS — R296 Repeated falls: Secondary | ICD-10-CM

## 2014-05-14 DIAGNOSIS — J4520 Mild intermittent asthma, uncomplicated: Secondary | ICD-10-CM

## 2014-05-14 DIAGNOSIS — G5793 Unspecified mononeuropathy of bilateral lower limbs: Secondary | ICD-10-CM

## 2014-05-14 DIAGNOSIS — F0150 Vascular dementia without behavioral disturbance: Secondary | ICD-10-CM

## 2014-05-14 DIAGNOSIS — M109 Gout, unspecified: Secondary | ICD-10-CM

## 2014-05-14 DIAGNOSIS — M069 Rheumatoid arthritis, unspecified: Secondary | ICD-10-CM

## 2014-05-14 DIAGNOSIS — M47812 Spondylosis without myelopathy or radiculopathy, cervical region: Secondary | ICD-10-CM

## 2014-05-14 DIAGNOSIS — I10 Essential (primary) hypertension: Secondary | ICD-10-CM

## 2014-05-14 DIAGNOSIS — G5792 Unspecified mononeuropathy of left lower limb: Secondary | ICD-10-CM

## 2014-05-14 DIAGNOSIS — M24419 Recurrent dislocation, unspecified shoulder: Secondary | ICD-10-CM

## 2014-05-14 DIAGNOSIS — M24411 Recurrent dislocation, right shoulder: Secondary | ICD-10-CM

## 2014-05-15 ENCOUNTER — Encounter: Payer: Self-pay | Admitting: Family Medicine

## 2014-05-15 DIAGNOSIS — M47812 Spondylosis without myelopathy or radiculopathy, cervical region: Secondary | ICD-10-CM | POA: Insufficient documentation

## 2014-05-15 DIAGNOSIS — M24411 Recurrent dislocation, right shoulder: Secondary | ICD-10-CM

## 2014-05-15 HISTORY — DX: Spondylosis without myelopathy or radiculopathy, cervical region: M47.812

## 2014-05-15 HISTORY — DX: Recurrent dislocation, right shoulder: M24.411

## 2014-05-15 NOTE — Assessment & Plan Note (Signed)
Multiple predisposing conditions for falls: history of orthostasis, peripheral balance difficulties fro peripheral neuropathy, osteoarthritis, rheumatoid arthritis with foot deformities, polypharmacy, ACHEI therapy, general muscle disuse weakness, balance impairment, dementia with impaired safety awareness, incontinence of bladder (freq) and bowel (infreq).  Patient's impulsiveness and poor safety awareness is problematic in keeping Connie Fitzpatrick safe.  She is receiving and participating in restorative therapy.

## 2014-05-15 NOTE — Progress Notes (Signed)
Patient ID: Connie Fitzpatrick, female   DOB: [birth year], 80 y.o.   MRN: [id number] [place]  Visit  Provider: Alen Bleacher  MD [facility] Visit Information: a scheduled routine follow-up visit Patient accompanied by patient Source of Information for Visit: Patient, [facility] EMR, [facility] EMR Chief Complaint: No complaints Chief Complaint  Patient presents with  . Regulatory Visit   Review of old [facility] records  2008 - [place] Admission (Dr Sabra Heck) for Alcohol Dependence, Alcohol Withdrawal, and Depression (NOS)   2012 - Advanced Directive (signed 05/09/1998) scanned into [place] document (signed and notarized 05/09/1998) scanned into [facility] EMR.  Connie Fitzpatrick designated HCPOA agent by patient. - On chronic benzodiazipine and opiates per PCP note - Urinary incotninence - Hypothyroidism - Asthma  - On Aricept  2013 - Followed at [facility] for leg wounds - Hospitalization 10/26/11 thru 11/04/11 for Small bowel perforation with Acute respiratory failure.  Treated with exploratory lap and small bowel resection. Discharged to [place] and [place] home service.  - Fall from [facility] - Pneumonia while at [place] with forehead injury - MMSE 24 out of 30  2014 - Memantine added to improve memory for safety awareness - Hospitalization for dehydration - Confusion from scheduled tramadol - Fall from [facility] with right shoulder injury. Xray showed severe glenohumeral arthropathy c/w RA as well as presence of  chronic anterior dislocation of right glenohumeral joint.  - Fall from bed onto face.  Closed Nasal bone fracture on Ct.  - Weight loss concern - MMSE 16 out of 28  2015  - Fall when getting out of bed 07/2013 - Start of Alprazolam 0.25 mg daily as needed anxiety by Dr Bridgett Larsson at request of dgt. Pt not on alprazolam currently.  - Fall from [facility] when leaning forward to pick up item off  floor.   Problem List Items Addressed This Visit      Rheumatoid arthritis - Longstanding issue.  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 Denies excess sedation. - On Duloxetine 30 mg daily for chronic pain.      Gout  - Longstanding problem - On febuxostat. Uric Acid 3.0 mg/dL on 04/07/14 - No recent flares of wrist or ankle pain.    Essential hypertension - Longstanding problem - Currently on metoprolol XL 50 mg daily  - No chest pain.  No new weakness in limbs or difficulty with speech.   Dementia, vascular - Diagnosis since at least 2012.  - On Aricept since 2012.  Started Memantine January 2014 with goal of improving safety behaviors - Pt has had three falls since start of Memantine.  - Predisposing conditions for falls: orthostasis, peripheral balance difficulties, osteoarthritis, rheumatoid arthritis with foot deformities, polypharmacy, ACHEI therapy, general muscle disuse weakness, balance impairment, dementia with impaired safety awareness          Outpatient Encounter Prescriptions as of 05/14/2014  Medication Sig  . acetaminophen (TYLENOL) 650 MG CR tablet Take 650 mg by mouth 2 (two) times daily.   . Amino Acids-Protein Hydrolys (FEEDING SUPPLEMENT, PRO-STAT SUGAR FREE 64,) LIQD Take 30 mLs by mouth daily.  . bisacodyl (DULCOLAX) 5 MG EC tablet Take 5  mg by mouth daily as needed (for constipation).   . calcium citrate (CALCITRATE - DOSED IN MG ELEMENTAL CALCIUM) 950 MG tablet Take 400 mg of elemental calcium by mouth daily.  . cetirizine (ZYRTEC) 10 MG tablet Take 1 tablet (10 mg total) by mouth daily.  Marland Kitchen donepezil (ARICEPT) 10 MG tablet Take 10 mg by mouth daily with breakfast.  . DULoxetine (CYMBALTA)  30 MG capsule Take 30 mg by mouth daily at 8 pm.   . ENSURE (ENSURE) Take 237 mLs by mouth daily before lunch.   . febuxostat (ULORIC) 40 MG tablet Take 40 mg by mouth daily.  Marland Kitchen gabapentin (NEURONTIN) 300 MG capsule Take 300 mg by mouth 2 (two) times daily.  Marland Kitchen levothyroxine (SYNTHROID, LEVOTHROID) 75 MCG tablet Take 1 tablet (75 mcg total) by mouth daily with breakfast. (Patient taking differently: Take 75 mcg by mouth daily. At 4pm)  . Melatonin 5 MG CAPS Take 5 mg by mouth daily at 8 pm.   . memantine (NAMENDA) 10 MG tablet Take 10 mg by mouth 2 (two) times daily.   . metoprolol succinate (TOPROL-XL) 50 MG 24 hr tablet Take 50 mg by mouth at bedtime. Take with or immediately following a meal.  . Multiple Vitamin (MULTIVITAMIN WITH MINERALS) TABS tablet Take 1 tablet by mouth daily at 8 pm.  . multivitamin-lutein (OCUVITE-LUTEIN) CAPS Take 1 capsule by mouth daily with breakfast.  . predniSONE (DELTASONE) 5 MG tablet Take 5 mg by mouth daily.  . Vitamin D, Ergocalciferol, (DRISDOL) 50000 UNITS CAPS Take 50,000 Units by mouth every 30 (thirty) days.  . vitamin E (VITAMIN E) 400 UNIT capsule Take 1 capsule (400 Units total) by mouth daily.  Marland Kitchen albuterol (PROVENTIL HFA;VENTOLIN HFA) 108 (90 BASE) MCG/ACT inhaler Inhale 2 puffs into the lungs every 4 (four) hours as needed. For shortness of breath  . guaifenesin (ROBITUSSIN) 100 MG/5ML syrup Take 200 mg by mouth every 4 (four) hours as needed for cough.  . Influenza vac split quadrivalent PF (FLUARIX QUADRIVALENT) 0.5 ML injection Inject 0.5 mLs into the muscle once.   History Patient Active Problem List   Diagnosis Date Noted  . Abrasion 02/02/2014  . Dysphagia, oropharyngeal 12/15/2013  . Frequent falls 09/01/2013  . Neuropathy of both feet 07/12/2013  . Nursing home resident 05/10/2013  . Orthostatic hypotension 07/28/2012  . Trigger finger, acquired 07/12/2012  . Wheelchair dependent 06/29/2012  . Dementia, vascular 05/02/2012  . Gout  synovitis   . Hypothyroidism 08/13/2010  . Rheumatoid arthritis 08/13/2010  . HYPERLIPIDEMIA 08/11/2010  . Essential hypertension 08/11/2010  . ALLERGIC RHINITIS 08/11/2010  . ASTHMA 08/11/2010  . GERD 08/11/2010  . URINARY INCONTINENCE 08/11/2010  . History of alcohol abuse 08/04/2010  . Depression 08/04/2010  . Osteoarthritis 08/04/2010  . Insomnia 08/04/2010  . KNEE REPLACEMENT, BILATERAL, HX OF 08/04/2010   Past Medical History  Diagnosis Date  . Gout   . INSOMNIA   . ALLERGIC RHINITIS   . ASTHMA   . OSTEOARTHRITIS   . Rheumatoid arthritis(714.0)   . HYPERTENSION   . HYPERLIPIDEMIA   . GERD (gastroesophageal reflux disease)   . Depression   . ALCOHOL ABUSE     hx detox, dry since 2010  . HYPOTHYROIDISM   . URINARY INCONTINENCE   . Dementia   . Recurrent falls   . SDH (subdural hematoma)     a. 2008 in setting of fall.  . Chronic pain   . Perforated small intestine     a. From  swallowed foreign body - s/p exlap with small bowel resection 09/2011.  Marland Kitchen Dysphagia, oropharyngeal 12/15/2013  . Stroke, lacunar     Old lacunar infarct left basal ganglia    Past Surgical History  Procedure Laterality Date  . Abdominal hysterectomy    . Breast surgery    . Total knee arthroplasty    . Vaginal prolapse repair    . Total knee arthroplasty  07/2002    right  . Total knee arthroplasty  09/1998    Left  . Cataract extraction  2001  . L-spine  1998  . Colpocleisis vaginal [place] fort  06/2010  . Laparotomy  10/26/2011    Procedure: EXPLORATORY LAPAROTOMY;  Surgeon: Imogene Burn. Georgette Dover, MD;  Location: [facility];  Service: General;  Laterality: N/A;  . Bowel resection  10/26/2011    Procedure: SMALL BOWEL RESECTION;  Surgeon: Imogene Burn. Georgette Dover, MD;  Location: [place];  Service: General;  Laterality: N/A;  . Carpal tunnel release   before 2008  . Lumbar laminectomy  2007    Dr Joya Salm   Family History  Problem Relation Age of Onset  . Arthritis Mother   . Diabetes Mother   . Hypertension  Mother   . Lung cancer Father   . Stroke Father      Instrumental Activities of Daily Living Dependent or needs assistance in finances, housekeeping, telephoning and travel outside home    Falls in the past six months:   Yes, Fall leaning forward from [facility] on 04/27/14.  Pt evaluation 05/02/14 by [facility] geriatric service and ED.  No acute findings on Head CT, Cervical Spine CT and Maxiollofacial CT.   Diet:  general Supplemental shakes:  yes  Review of Systems  See HPI General: Denies fevers and pain Eyes: Denies change in  vision Cardiovascular: Denies  chest pains, orthopnea, peripheral edema.  Respiratory: Denies cough  Gastrointestinal: Denies diarrhea.  Genitourinary: Denies dysuria Musculoskeletal: Denies swelling   Neurologic: Denies transient paralysis Psychiatric: Denies anxiety   PHYSICAL EXAM:. Wt Readings from Last 3 Encounters:  05/01/14 104 lb 3.2 oz (47.265 kg)  10/21/13 99 lb 12.8 oz (45.269 kg)  10/21/13 99 lb 12.8 oz (45.269 kg)   Temp Readings from Last 3 Encounters:  05/02/14 97.6 F (36.4 C) Oral  04/07/14 97.8 F (36.6 C)   04/01/14 97.9 F (36.6 C) Oral   BP Readings from Last 3 Encounters:  05/03/14 136/66  05/02/14 208/79  05/02/14 140/75   Pulse Readings from Last 3 Encounters:  05/03/14 62  05/02/14 73  05/02/14 68    General: No acute distress, thin, seated in WC with easy to open seat belt in place, pleasant, groomed HEENT: Resolving raccoon eyes, no battle signs Neck:  Supple, able to move FROM actively with rotation and flex/ext without pain CV:  RRR, no ankle swelling RESP: No resp distress or accessory muscle use.  Clear to ausc bilat. No wheezing, no rales, no rhonchi.  ABD:  Soft, Non-tender, non-distended, +bowel sounds, no masses MSK:  No back pain, no joint pain.  No joint swelling or redness EXT:     Gauze wrap dressing clean and intact left proximal leg Psych: Speech clear, Language concrete and reality based. Mood is  social and engaging.   MMSE or MoCa:  16 / 28 in October 2014.  Years of Education:> 12 years  Assessment and Plan:   See Problem List for individual problem's assessment and plans.   Code Status:    Limited Code  Pt has an advance directive     - Advanced Directive (signed 05/09/1998) scanned into [place] document (signed and notarized 05/09/1998) scanned into [facility] EMR.  Connie Fitzpatrick designated HCPOA agent by patient.  Follow Up:  Next 60 days unless acute issues arise.

## 2014-05-15 NOTE — Assessment & Plan Note (Signed)
Variable cognitive testing results Currently dependent in iADLs.  ADL dependence driven more by musculoskelatal issues than cognition Would place patient in moderate stage of dementia. Palliative Performance Scale of 50%. No significant Behavioral or  Psychological Symptoms of Dementia On ACHEI and NMDA antagonist without directly attributable adverse effects.  Recommend continue Donepezil and Memantine for now.

## 2014-05-15 NOTE — Assessment & Plan Note (Signed)
Stable Has not needed as needed albuterol during December Not on an ICS.  Has been on QVAr in past. Continue current as needed rescue inhaler for now.

## 2014-05-15 NOTE — Assessment & Plan Note (Signed)
Healing left leg superficial laceration after fall 04/27/14 Discussed with Wound Care nurse. Topical care currently.

## 2014-06-08 ENCOUNTER — Encounter: Payer: Self-pay | Admitting: Pharmacist

## 2014-06-08 ENCOUNTER — Encounter: Payer: Self-pay | Admitting: Family Medicine

## 2014-06-08 LAB — TSH: TSH: 0.768 u[IU]/mL (ref 0.350–4.500)

## 2014-06-19 ENCOUNTER — Non-Acute Institutional Stay (INDEPENDENT_AMBULATORY_CARE_PROVIDER_SITE_OTHER): Payer: Medicare Other | Admitting: Family Medicine

## 2014-06-19 DIAGNOSIS — Z993 Dependence on wheelchair: Secondary | ICD-10-CM

## 2014-06-19 DIAGNOSIS — I10 Essential (primary) hypertension: Secondary | ICD-10-CM

## 2014-06-19 DIAGNOSIS — M069 Rheumatoid arthritis, unspecified: Secondary | ICD-10-CM

## 2014-06-19 DIAGNOSIS — J309 Allergic rhinitis, unspecified: Secondary | ICD-10-CM

## 2014-06-19 DIAGNOSIS — E039 Hypothyroidism, unspecified: Secondary | ICD-10-CM

## 2014-06-19 DIAGNOSIS — M1009 Idiopathic gout, multiple sites: Secondary | ICD-10-CM

## 2014-06-19 DIAGNOSIS — F329 Major depressive disorder, single episode, unspecified: Secondary | ICD-10-CM

## 2014-06-19 DIAGNOSIS — M109 Gout, unspecified: Secondary | ICD-10-CM

## 2014-06-19 DIAGNOSIS — F0150 Vascular dementia without behavioral disturbance: Secondary | ICD-10-CM

## 2014-06-19 DIAGNOSIS — F32A Depression, unspecified: Secondary | ICD-10-CM

## 2014-06-19 DIAGNOSIS — J4520 Mild intermittent asthma, uncomplicated: Secondary | ICD-10-CM

## 2014-06-19 DIAGNOSIS — G4700 Insomnia, unspecified: Secondary | ICD-10-CM

## 2014-06-19 DIAGNOSIS — M1990 Unspecified osteoarthritis, unspecified site: Secondary | ICD-10-CM

## 2014-06-19 NOTE — Assessment & Plan Note (Signed)
Controlled with intermittent mild L hand stiffness Continue chronic prednisone

## 2014-06-19 NOTE — Assessment & Plan Note (Signed)
Controlled on metop Continue Likely would not tolerate more BB with low pulse occasionally

## 2014-06-19 NOTE — Assessment & Plan Note (Signed)
Stable, continue zyrtec.  

## 2014-06-19 NOTE — Assessment & Plan Note (Signed)
Continued use/need Good mobility in the chair using her legs Has repeated bumping of her L BL, clean dry intact bandages present today on BL presumably from this.

## 2014-06-19 NOTE — Progress Notes (Addendum)
Patient ID: Quincie Haroon, female   DOB: 1928/01/16, 81 y.o.   MRN: [id number]   Subjective:  Patient seen in the nursing home for regulatory visit  Today she denies any complaints. She does have mild intermittent stiffness of her left hand. She's tolerating all of her medicines well.  She reports good by mouth intake and states the food is okay in the nursing home.  She denies any chest pain, dyspnea, or difficulties with stooling.  BP range: 122-176/64-81 Compliant with meds  ROS: Per history of present illness  Objective: Filed Vitals:   06/17/14 1217  BP: 122/68  Pulse: 74  Temp: 98.7 F (37.1 C)  Resp: 22   Gen: NAD, alert, cooperative with exam HEENT: NCAT, EOMI, MMM CV: RRR, good S1/S2, no murmur Resp: CTABL, no wheezes, non-labored Abd: SNTND, BS present, no guarding or organomegaly Ext: No edema, warm, clean dry intact bandages on bilateral lower extremities Neuro: Alert and oriented to person , nursing home, and day of the peak  Assessment and plan:  Rheumatoid arthritis Controlled with intermittent mild L hand stiffness Continue chronic prednisone    Wheelchair dependent Continued use/need Good mobility in the chair using her legs Has repeated bumping of her L BL, clean dry intact bandages present today on BL presumably from this.    Osteoarthritis Pain controlled with Tylenol, continue   Insomnia No complaints and melatonin not on her med list, will ask NH team to look into this with pharmacy team.    Hypothyroidism TSH on 06/07/14 WNL Continue current dose of synthroid   Gout synovitis No flares reported Continue uloric and prednisone   Essential hypertension Controlled on metop Continue Likely would not tolerate more BB with low pulse occasionally   Depression Stable, continue cymbalta   Dementia, vascular Last MMSE 3 months ago Appears stable currently Continue namenda and aricept, if bradycardia becomes and issue (few low  pulses) then DC aricept   Allergic rhinitis Stable, continue zyrtec   Asthma Controlled Continue PRN albuterol

## 2014-06-19 NOTE — Assessment & Plan Note (Signed)
No flares reported Continue uloric and prednisone

## 2014-06-19 NOTE — Assessment & Plan Note (Signed)
Pain controlled with Tylenol, continue

## 2014-06-19 NOTE — Assessment & Plan Note (Signed)
TSH on 06/07/14 WNL Continue current dose of synthroid

## 2014-06-19 NOTE — Assessment & Plan Note (Signed)
Last MMSE 3 months ago Appears stable currently Continue namenda and aricept, if bradycardia becomes and issue (few low pulses) then DC aricept

## 2014-06-19 NOTE — Assessment & Plan Note (Signed)
No complaints and melatonin not on her med list, will ask NH team to look into this with pharmacy team.

## 2014-06-19 NOTE — Assessment & Plan Note (Signed)
Stable, continue cymbalta  ?

## 2014-06-19 NOTE — Assessment & Plan Note (Signed)
Controlled Continue PRN albuterol

## 2014-06-20 NOTE — Progress Notes (Signed)
Patient ID: Connie Fitzpatrick, female   DOB: [birth year], 81 y.o.   MRN: [id number] I reviewed Dr Alen Bleacher plans for management of [name]'s chronic conditions and they appear appropriate.

## 2014-07-03 ENCOUNTER — Other Ambulatory Visit: Payer: Self-pay | Admitting: Family Medicine

## 2014-07-04 ENCOUNTER — Other Ambulatory Visit: Payer: Self-pay | Admitting: Family Medicine

## 2014-07-04 NOTE — Addendum Note (Signed)
Addended by: Emmaline Kluver on: 07/04/2014 02:30 PM   Modules accepted: Orders, Medications

## 2014-07-13 ENCOUNTER — Encounter: Payer: Self-pay | Admitting: Family Medicine

## 2014-07-13 ENCOUNTER — Non-Acute Institutional Stay: Payer: Medicare Other | Admitting: Family Medicine

## 2014-07-13 DIAGNOSIS — I10 Essential (primary) hypertension: Secondary | ICD-10-CM

## 2014-07-13 DIAGNOSIS — F0150 Vascular dementia without behavioral disturbance: Secondary | ICD-10-CM

## 2014-07-13 DIAGNOSIS — G5792 Unspecified mononeuropathy of left lower limb: Secondary | ICD-10-CM

## 2014-07-13 DIAGNOSIS — G5791 Unspecified mononeuropathy of right lower limb: Secondary | ICD-10-CM | POA: Diagnosis not present

## 2014-07-13 DIAGNOSIS — M109 Gout, unspecified: Secondary | ICD-10-CM

## 2014-07-13 DIAGNOSIS — G5793 Unspecified mononeuropathy of bilateral lower limbs: Secondary | ICD-10-CM

## 2014-07-13 DIAGNOSIS — E038 Other specified hypothyroidism: Secondary | ICD-10-CM | POA: Diagnosis not present

## 2014-07-13 DIAGNOSIS — M069 Rheumatoid arthritis, unspecified: Secondary | ICD-10-CM | POA: Diagnosis not present

## 2014-07-13 DIAGNOSIS — M1009 Idiopathic gout, multiple sites: Secondary | ICD-10-CM

## 2014-07-13 NOTE — Assessment & Plan Note (Signed)
Stable. Recent TSH WNL Continue Levothyroxine 75 mcg daily

## 2014-07-13 NOTE — Assessment & Plan Note (Signed)
Adequate blood pressure control.  No evidence of new end organ damage.  Tolerating medication without significant adverse effects.  Plan to continue current blood pressure regiment.   

## 2014-07-13 NOTE — Assessment & Plan Note (Signed)
FAST Stage: 6 (moderate-severe) Palliative Performance Score: 40%.  Progression since last assessment: none Behavioral and Psychological Symptoms of Dementia: none  Pharmacologic treatments of patient: Aricept for ~3 years, Memantine for ~2 years.  Response: Unknown Adverse effects from pharmacologic treatments: urinary incontinence, though probably not only source of this condition  Once Dementia stage 7 with start of loss of language, would try trial of antidementia medications

## 2014-07-13 NOTE — Assessment & Plan Note (Signed)
Stable.  No flares Uric Acid down to 3 with febuxoistat 40 mg daily Tolerating medication Continue current therapy

## 2014-07-13 NOTE — Assessment & Plan Note (Addendum)
Stable Tolerating Gabapentin and Duloxetine  Continue current therapy

## 2014-07-13 NOTE — Progress Notes (Signed)
Patient ID: Connie Fitzpatrick, female   DOB: 11-01-1927, 81 y.o.   MRN: [id number] [facility]  Visit  Primary Care Provider:  Location of Care: [facility] Visit Information: a scheduled routine follow-up visit Patient accompanied by patient Source(s) of information for visit: patient, nursing home and past medical records  Chief Complaint:  Chief Complaint  Patient presents with  . Regulatory Visit    Nursing Concerns: No concerns Nutrition Concerns: Taking Ensure daily, Magic Cup twice daily , prostat 30 mL daily, Self-feeds with cueing, eating 75-100% of meals.  Wound Care Nurse Concerns: skin tears bilateral shins, healing. [place] dressing PT / OT Concerns: Participating in Restorative care 3 x /week for AROM/PROM  HISTORY OF PRESENT ILLNESS:  RA Longstanding On prednisone 5 mg daily No abdominal pain, no indigestion No active pain or stiffness in hands or feet.   Hypothyroidism Longstanding issue for patient Patient presents for evaluation of thyroid function.  Symptoms consist of denies fatigue, weight changes, heat/cold intolerance, bowel/skin changes or CVS symptoms.  The problem has been stable.   Previous thyroid studies include TSH.  The hypothyroidism is due to Hashimoto's disease.  Dementia / Cognitive impairment - Onset: Diagnosis in 2012, Started on Donepezil - Change in ADLs / iADLs: dependent in all iADLs and needs assistance with dressing and bathing and toileting,  Needs cueing for eating but self-feeds. Able to propel WC with feet - No changes in personality or behaviors. -  no delusions. none hallucinations. - no aggression / inappropriate behaviors - no day / night disturbance - no wandering / pacing - no tearful / sad appearing - no anxiousness / repeated queries / money concerns / abandonment fears / perseverating  - Dementia Medications : Donepezil 10 mg since 2012, Memantine since  2014 - Adverse effects:(+) urinary  incontinence.  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 No reports of daytime sedation. - On Duloxetine 30 mg daily for chronic pain.  Gout with Tophi - Longstanding problem - On febuxostat. Uric Acid 3.0 mg/dL on 04/07/14 - No recent flares of wrist or ankle pain.   Essential hypertension - Longstanding problem - Currently on metoprolol XL 50 mg daily  - No chest pain. No new weakness in limbs or difficulty with speech.     Outpatient Encounter Prescriptions as of 07/13/2014  Medication Sig  . acetaminophen (TYLENOL) 650 MG CR tablet Take 650 mg by mouth 2 (two) times daily.   . Amino Acids-Protein Hydrolys (FEEDING SUPPLEMENT, PRO-STAT SUGAR FREE 64,) LIQD Take 30 mLs by mouth daily.  . bisacodyl (DULCOLAX) 5 MG EC tablet Take 5 mg by mouth daily as needed (for constipation).   . calcium citrate (CALCITRATE - DOSED IN MG ELEMENTAL CALCIUM) 950 MG tablet Take 400 mg of elemental calcium by mouth daily.  . cetirizine (ZYRTEC) 10 MG tablet Take 1 tablet (10 mg total) by mouth daily.  Marland Kitchen donepezil (ARICEPT) 10 MG tablet Take 10 mg by mouth daily with breakfast.  . DULoxetine (CYMBALTA) 30 MG capsule Take 30 mg by mouth daily at 8 pm.   . ENSURE (ENSURE) Take 237 mLs by mouth daily before lunch.   . febuxostat (ULORIC) 40 MG  tablet Take 40 mg by mouth daily.  Marland Kitchen gabapentin (NEURONTIN) 300 MG capsule Take 300 mg by mouth 2 (two) times daily.  Marland Kitchen levothyroxine (SYNTHROID, LEVOTHROID) 75 MCG tablet Take 1 tablet (75 mcg total) by mouth daily with breakfast. (Patient taking differently: Take 75 mcg by mouth daily. At 4pm)  .  memantine (NAMENDA) 10 MG tablet Take 10 mg by mouth 2 (two) times daily.  . metoprolol succinate (TOPROL-XL) 50 MG 24 hr tablet Take 75 mg by mouth at bedtime.   . Multiple Vitamin (MULTIVITAMIN WITH MINERALS) TABS tablet Take 1 tablet by mouth daily at 8 pm.  . multivitamin-lutein (OCUVITE-LUTEIN) CAPS Take 1 capsule by mouth daily with breakfast.  . predniSONE (DELTASONE) 5 MG tablet Take 5 mg by mouth daily.  . Vitamin D, Ergocalciferol, (DRISDOL) 50000 UNITS CAPS Take 50,000 Units by mouth every 30 (thirty) days.  . vitamin E (VITAMIN E) 400 UNIT capsule Take 1 capsule (400 Units total) by mouth daily.  Marland Kitchen albuterol (PROVENTIL HFA;VENTOLIN HFA) 108 (90 BASE) MCG/ACT inhaler Inhale 2 puffs into the lungs every 4 (four) hours as needed. For shortness of breath   Allergies  Allergen Reactions  . Enalapril Maleate Anaphylaxis and Swelling    From MAR  . Azithromycin     From MAR  . Morphine And Related Other (See Comments)    Possible hallucinations ( 07/12) skin crawling off of body. From [facility]  . Nsaids     Ulcer on EGD 01/01/10 - stopped celebrex From MAR  . Valacyclovir Hcl     From MAR  . Allopurinol Rash    From [facility]   History Patient Active Problem List   Diagnosis Date Noted  . Cervical spondylosis without myelopathy 05/15/2014  . Abrasion 02/02/2014  . Dysphagia, oropharyngeal 12/15/2013  . Frequent falls 09/01/2013  . Neuropathy of both feet 07/12/2013  . Nursing home resident 05/10/2013  . Wheelchair dependent 06/29/2012  . Dementia, vascular 05/02/2012  . Gout synovitis   . Hypothyroidism 08/13/2010  . Rheumatoid arthritis 08/13/2010  . HYPERLIPIDEMIA 08/11/2010  . Essential hypertension 08/11/2010  . Allergic rhinitis 08/11/2010  . Asthma 08/11/2010  . GERD 08/11/2010  . URINARY INCONTINENCE 08/11/2010  . Depression 08/04/2010  . Osteoarthritis 08/04/2010  . Insomnia 08/04/2010   Past Medical History  Diagnosis Date  . Gout   . INSOMNIA   . ALLERGIC  RHINITIS   . ASTHMA   . OSTEOARTHRITIS   . Rheumatoid arthritis(714.0)   . HYPERTENSION   . HYPERLIPIDEMIA   . GERD (gastroesophageal reflux disease)   . Depression   . ALCOHOL ABUSE     hx detox, dry since 2010  . HYPOTHYROIDISM   . URINARY INCONTINENCE   . Dementia   . Recurrent falls   . SDH (subdural hematoma)     a. 2008 in setting of fall.  . Chronic pain   . Perforated small intestine     a. From swallowed foreign body - s/p exlap with small bowel resection 09/2011.  Marland Kitchen Dysphagia, oropharyngeal 12/15/2013  . Stroke, lacunar     Old lacunar infarct left basal ganglia   . Chronic dislocation of right shoulder 05/15/2014  . Cervical spondylosis without myelopathy 05/15/2014    05/02/14 Cervical spine CT: Severe degenerative disc disease is noted at C3-4, C4-5, C5-6 and C6-7. Erosive degenerative changes are again noted involving the odontoid process which are unchanged compared to prior exam. Degenerative changes seen involving the posterior facet joints at multiple levels posteriorly.  Marland Kitchen  Orthostatic hypotension 07/28/2012  . Trigger finger, acquired 07/12/2012    Left 3rd, symptomatic   . KNEE REPLACEMENT, BILATERAL, HX OF 08/04/2010    Qualifier: Diagnosis of  By: Marca Ancona RMA, Lucy    . History of alcohol abuse 08/04/2010    Qualifier: Diagnosis of  By: Marca Ancona RMA, Lucy    . Asthma 08/11/2010    Qualifier: Diagnosis of  By: Asa Lente MD, Jannifer Rodney Allergic rhinitis 08/11/2010    Qualifier: Diagnosis of  By: Asa Lente MD, Jannifer Rodney    Past Surgical History  Procedure Laterality Date  . Abdominal hysterectomy    . Breast surgery    . Total knee arthroplasty    . Vaginal prolapse repair    . Total knee arthroplasty  07/2002    right  . Total knee arthroplasty  09/1998    Left  . Cataract extraction  2001  . L-spine  1998  . Colpocleisis vaginal [place] fort  06/2010  . Laparotomy  10/26/2011    Procedure: EXPLORATORY LAPAROTOMY;  Surgeon: Imogene Burn. Georgette Dover, MD;  Location: [place];   Service: General;  Laterality: N/A;  . Bowel resection  10/26/2011    Procedure: SMALL BOWEL RESECTION;  Surgeon: Imogene Burn. Georgette Dover, MD;  Location: [place];  Service: General;  Laterality: N/A;  . Carpal tunnel release   before 2008  . Lumbar laminectomy  2007    Dr Joya Salm   Family History  Problem Relation Age of Onset  . Arthritis Mother   . Diabetes Mother   . Hypertension Mother   . Lung cancer Father   . Stroke Father     reports that she has never smoked. She does not have any smokeless tobacco history on file. She reports that she does not drink alcohol or use illicit drugs.  Basic Activities of Daily Living   ADLs Independent Needs Assistance Dependent  Bathing   x  Dressing   x  Ambulation x    Toileting  x   Eating  x      Falls in the past six months:   yes  Diet:  general Supplemental shakes:  yes  Review of Systems  Patient has ability to communicate answers to ROS: yes See HPI  Geriatric Syndromes: Constipation no ,   Incontinence yes  Dizziness no   Syncope no   Skin problems yes   Visual Impairment yes   Hearing impairment no  Eating impairment no  Impaired Memory or Cognition yes   Behavioral problems no   Sleep problems yes   Weight loss no      General: Denies weight loss, fatigue,  Eyes: Denies blurred vision  Ears/Nose/Throat: Denies ear pain,rhinorrhea, nasal congestion.  Cardiovascular: Denies chest pains, dyspnea on exertion, orthopnea, peripheral edema.  Respiratory: Denies cough, sputum, dyspnea.  Gastrointestinal: Denies abd pain, bloating, constipation, diarrhea.  Musculoskeletal: Denies joint pain, swelling, weakness.   PHYSICAL EXAM:. Wt Readings from Last 3 Encounters:  07/12/14 104 lb 3.2 oz (47.265 kg)  05/01/14 104 lb 3.2 oz (47.265 kg)  10/21/13 99 lb 12.8 oz (45.269 kg)   Temp Readings from Last 3 Encounters:  07/08/14 97.8 F (36.6 C)   06/17/14 98.7 F (37.1 C)   05/02/14 97.6 F (36.4 C) Oral   BP Readings  from Last 3 Encounters:  07/08/14 126/74  06/17/14 122/68  05/03/14 136/66   Pulse Readings from Last 3 Encounters:  07/08/14 74  06/17/14 74  05/03/14 62    General:  alert, cooperative, no distress, well nourished, pleasant, clean, groomed HEENT:  No scleral icterus, no nasal secretions, EACs not occluded, TMs clear bilat, [place] moist and no erythema or lesion with Fair dentition Neck:  Supple, No JVD, no lymphadenopathy CV:  RRR, Early decrescendo diastolic murmur left lateral sternal border RESP: No resp distress or accessory muscle use.  Clear to ausc bilat. No wheezing, no rales, no rhonchi.  ABD:  Soft, Non-tender,  +bowel sounds,   EXT: Warm and well perfused   no edema, no erythema, pulses WNL and wearing support hose.  Dressing over shins dry and intact.  Gait:  In [facility]  Neurologic: Muscle Tone within normal limits; Psych: Attention Normal;  Mood appropriate; Speech normal; Language barrier none ; Thought Coherent and Relevant    MMSE - Mini Mental State Exam 03/28/2013 04/13/2012  Orientation to time 5 4  Orientation to Place 2 5  Registration 1 3  Attention/ Calculation 0 2  Recall 0 1  Language- name 2 objects 1 2  Language- repeat 1 1  Language- follow 3 step command 3 3  Language- read & follow direction 1 1  Write a sentence 1 1  Copy design 1 1  Total score 16 24     BIMS (04/15/14)  11/15 PHQ-9 (04/15/14) 0/27 Years of Education: 16 +  Assessment and Plan:   See Problem List for individual problem's assessment and plans.      Code Status: Limited Code Pt has an advance directive - Advanced Directive (signed 05/09/1998) scanned into [place] document (signed and notarized 05/09/1998) scanned into [facility] EMR. Mignon Bechler Pack Manetta designated HCPOA agent by patient.  Follow Up:  Next 60 days unless acute issues arise.

## 2014-07-13 NOTE — Assessment & Plan Note (Signed)
Stable. No active arthritis. Tolerating Prednisone 5 mg daily Scheduled APAP

## 2014-07-18 ENCOUNTER — Telehealth: Payer: Self-pay | Admitting: Family Medicine

## 2014-07-18 NOTE — Telephone Encounter (Signed)
Received a page from [facility] this evening on 24 hour line concerning patient's behavior. They state that patient is more confused, being more aggressive and family has concern she has a urinary tract infection. Nursing assistant stated that she also feels the patient's personality has changed. Her vitals are currently stable. Patient denies urinary frequency or dysuria. - Ordered urine analysis and culture, were afford to geriatrics resident and Follow-up in the morning. Howard Pouch DO PGY3 [facility]

## 2014-07-18 NOTE — Telephone Encounter (Signed)
error 

## 2014-07-19 ENCOUNTER — Non-Acute Institutional Stay: Payer: Medicare Other | Admitting: Family Medicine

## 2014-07-19 ENCOUNTER — Telehealth: Payer: Self-pay | Admitting: Family Medicine

## 2014-07-19 DIAGNOSIS — R451 Restlessness and agitation: Secondary | ICD-10-CM | POA: Diagnosis not present
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
## 2014-07-19 NOTE — Assessment & Plan Note (Signed)
Patient with some agitation overnight. Appears stable at this time. No signs of infection on exam and afebrile. Urine already collected so will follow up on this. Potentially related to sundowning and dementia. Will need to monitor closely and consider addition of seroquel at night time if continues to be an issue. Discussed with Dr Ree Kida.

## 2014-07-19 NOTE — Telephone Encounter (Signed)
Emergency Line Call  RN at [facility] calls to report pt with a few days of "verbal agressiveness" with shouting at staff and nurses. Urine (clean catch) was obtained early this morning; UA shows large leukocyte esterase, positive nitrite, WBC 21-50, many bacteria, but culture / sensitivity has not yet resulted. She has not had any complaints of urinary burning or abdominal pain but does have some increased frequency / questionable urge. She has not had fevers. Per RN, pt "denies that anything is wrong."  No WBC or leukocytosis and no frank suprapubic tenderness or gross hematuria; questionable increase in urge / frequency, and no urine culture result yet, so per McGeer criteria unable to diagnose this as a UTI as of yet. Will at least await urine culture result and continue to monitor. Asked RN to report any further concerns / changes tonight.  Note FYI to Dr. Wendy Poet and current geri resident Dr. Caryl Bis for F/U tomorrow. FYI also to PCP Dr. Wendi Snipes.  Emmaline Kluver, MD PGY-3, [facility] 07/19/2014, 7:36 PM
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
## 2014-07-21 ENCOUNTER — Telehealth: Payer: Self-pay | Admitting: Family Medicine

## 2014-07-21 NOTE — Telephone Encounter (Signed)
Entered in delay. Phone call received at 4:50 pm 07/20/14.  Urine culture returned with 100,000 CFU E coli. Patient is asymptomatic at this time with no complaints of dysuria, urinary frequency, urgency, hematuria, fevers, abdominal pain, or flank pain. Per McGeer criteria she does not meet treatment criteria for UTI in nursing home patient. Advised nursing of this and we will continue to monitor. If she develops symptoms will consider treatment of UTI.  FYI to Dr McDiarmid   Tommi Rumps, MD

## 2014-07-23 ENCOUNTER — Telehealth: Payer: Self-pay | Admitting: Family Medicine

## 2014-07-23 NOTE — Telephone Encounter (Signed)
I agree with plan to monitor for development of further symptoms/signs of UTI before initiating antibiotics.  Please follow up with Ms Kimple this week.

## 2014-07-23 NOTE — Telephone Encounter (Signed)
Called by [facility] for UA cx results of >100K of E.Coli , Cipro sensitive . Pt allergic to penicillin. Normal Creatinine. Will cover with Cipro 500 mg PO BID for 7 days.  Will forward to geri resident to follow up in the morning.  Howard Pouch DO  PGY3 [facility]

## 2014-07-24 NOTE — Telephone Encounter (Signed)
Noted that patient started on cipro over night for UCx results. Patient has been asymptomatic with regards to urinary complaints and it was previously discussed with nursing that the patient did not meet McGeer's criteria for UTI and did not warrant treatment with antibiotics at this time. I discussed this with Dr McDiarmid today and he agreed. I spoke to nursing and advised them to discontinue the cipro at this time.   Tommi Rumps, MD

## 2014-08-17 ENCOUNTER — Non-Acute Institutional Stay (INDEPENDENT_AMBULATORY_CARE_PROVIDER_SITE_OTHER): Payer: Medicare Other | Admitting: Family Medicine

## 2014-08-17 ENCOUNTER — Encounter: Payer: Self-pay | Admitting: Family Medicine

## 2014-08-17 DIAGNOSIS — J4520 Mild intermittent asthma, uncomplicated: Secondary | ICD-10-CM

## 2014-08-17 DIAGNOSIS — F329 Major depressive disorder, single episode, unspecified: Secondary | ICD-10-CM

## 2014-08-17 DIAGNOSIS — M109 Gout, unspecified: Secondary | ICD-10-CM

## 2014-08-17 DIAGNOSIS — G5791 Unspecified mononeuropathy of right lower limb: Secondary | ICD-10-CM

## 2014-08-17 DIAGNOSIS — N390 Urinary tract infection, site not specified: Secondary | ICD-10-CM

## 2014-08-17 DIAGNOSIS — E038 Other specified hypothyroidism: Secondary | ICD-10-CM

## 2014-08-17 DIAGNOSIS — F32A Depression, unspecified: Secondary | ICD-10-CM

## 2014-08-17 DIAGNOSIS — Z993 Dependence on wheelchair: Secondary | ICD-10-CM

## 2014-08-17 DIAGNOSIS — M069 Rheumatoid arthritis, unspecified: Secondary | ICD-10-CM

## 2014-08-17 DIAGNOSIS — M1009 Idiopathic gout, multiple sites: Secondary | ICD-10-CM

## 2014-08-17 DIAGNOSIS — G4700 Insomnia, unspecified: Secondary | ICD-10-CM

## 2014-08-17 DIAGNOSIS — F0150 Vascular dementia without behavioral disturbance: Secondary | ICD-10-CM

## 2014-08-17 DIAGNOSIS — J309 Allergic rhinitis, unspecified: Secondary | ICD-10-CM

## 2014-08-17 DIAGNOSIS — G5792 Unspecified mononeuropathy of left lower limb: Secondary | ICD-10-CM

## 2014-08-17 DIAGNOSIS — I10 Essential (primary) hypertension: Secondary | ICD-10-CM

## 2014-08-17 DIAGNOSIS — M1990 Unspecified osteoarthritis, unspecified site: Secondary | ICD-10-CM

## 2014-08-17 DIAGNOSIS — R8271 Bacteriuria: Secondary | ICD-10-CM | POA: Insufficient documentation

## 2014-08-17 DIAGNOSIS — G5793 Unspecified mononeuropathy of bilateral lower limbs: Secondary | ICD-10-CM

## 2014-08-17 NOTE — Assessment & Plan Note (Signed)
Good mobility in wheelchair,  safely operates but frequently bumps her legs, no evidence of lesions seen today

## 2014-08-17 NOTE — Assessment & Plan Note (Signed)
Stable, Continue cymbalta

## 2014-08-17 NOTE — Assessment & Plan Note (Signed)
No complaints and now off of melatonin

## 2014-08-17 NOTE — Assessment & Plan Note (Signed)
No current symptoms, stable Tophus on L arm unchanged to smaller Continue Uloric, uric acid last check was 3

## 2014-08-17 NOTE — Assessment & Plan Note (Signed)
Elevated lately Likely due to dc metoprolol which was necessary due to intermittent bradycardia Consider 5 mg amlodipine, but with slight edema today may cause worsening edema

## 2014-08-17 NOTE — Assessment & Plan Note (Signed)
Changed to claritin, I'm assuming due to formulary preference Continue claritin

## 2014-08-17 NOTE — Assessment & Plan Note (Signed)
Non labored without wheezing today Continue PRN albuetrol

## 2014-08-17 NOTE — Assessment & Plan Note (Signed)
Agree with Dr. McDiarmid, possibly RA related neuropathy No complaints today Continue gabapentin and cymbalta

## 2014-08-17 NOTE — Assessment & Plan Note (Signed)
Stable TSH Jan 0.78 Continue synthroid

## 2014-08-17 NOTE — Assessment & Plan Note (Signed)
No complaints, continue scheduled tylenol

## 2014-08-17 NOTE — Assessment & Plan Note (Signed)
Stable with only mild stiffness in L hand she says that may have been there for months Continue prednisone 5 mg daily Scheduled tylenol

## 2014-08-17 NOTE — Progress Notes (Signed)
Patient ID: Katalea Ucci, female   DOB: [birth year], 81 y.o.   MRN: [id number]   Subjective:  Pt seen in her room at the Nursing Home for regulatory visit  Pt feeling well today without complaints  She states he feels she is in good health with a good appetite. She denies dysuria or abd pain. She at one point stated she was dizzy but on further question said she couldn't remember and wasn't dizzy any longer.   She has some mild stiffness of her L hand she says has not changed for months  BP No chest pain, palpitations, dyspnea BP range 124-157/60-84, Today elevated above usual at 170/92 Pulse 61-102 Has stopped toprol since last visit  Objective: Filed Vitals:   08/11/14 1223  BP: 170/92  Pulse: 68  Temp: 96.6 F (35.9 C)  Resp: 18   Gen: NAD, alert, cooperative with exam HEENT: NCAT, EOMI CV: RRR, good S1/S2, no murmur Resp: CTABL, no wheezes, non-labored Abd: SNTND, BS present, no guarding Ext: trace pitting edema BL, No bandages or obvious abrasions Neuro: Alert and conversational, Oriented to person, place and August 06, 1950  Assessment and plan:  Wheelchair dependent Good mobility in wheelchair,  safely operates but frequently bumps her legs, no evidence of lesions seen today    Rheumatoid arthritis Stable with only mild stiffness in L hand she says that may have been there for months Continue prednisone 5 mg daily Scheduled tylenol   Osteoarthritis No complaints, continue scheduled tylenol    Neuropathy of both feet Agree with Dr. McDiarmid, possibly RA related neuropathy No complaints today Continue gabapentin and cymbalta   Insomnia No complaints and now off of melatonin   Hypothyroidism Stable TSH Jan 0.78 Continue synthroid   Gout synovitis No current symptoms, stable Tophus on L arm unchanged to smaller Continue Uloric, uric acid last check was 3    Essential hypertension Elevated lately Likely due to dc metoprolol which was  necessary due to intermittent bradycardia Consider 5 mg amlodipine, but with slight edema today may cause worsening edema    Depression Stable, Continue cymbalta   Dementia, vascular Constinue aricepta nd namenda Watch for bradycardia with aricept but has improved off of BB   Asthma Non labored without wheezing today Continue PRN albuetrol   Allergic rhinitis Changed to claritin, I'm assuming due to formulary preference Continue claritin   Asymptomatic bacteriuria Recent culture with E coli collected after agitation  No symptoms of UTI then or now, no need for Antibiotics Continue to follow for symptoms

## 2014-08-17 NOTE — Assessment & Plan Note (Addendum)
Recent culture with E coli collected after agitation  No symptoms of UTI then or now, no need for Antibiotics Continue to follow for symptoms

## 2014-08-17 NOTE — Assessment & Plan Note (Signed)
Constinue aricepta nd namenda Watch for bradycardia with aricept but has improved off of BB

## 2014-08-20 ENCOUNTER — Encounter: Payer: Self-pay | Admitting: Pharmacist

## 2014-08-22 ENCOUNTER — Non-Acute Institutional Stay (INDEPENDENT_AMBULATORY_CARE_PROVIDER_SITE_OTHER): Payer: Medicare Other | Admitting: Family Medicine

## 2014-08-22 DIAGNOSIS — M7500 Adhesive capsulitis of unspecified shoulder: Secondary | ICD-10-CM

## 2014-08-22 NOTE — Progress Notes (Signed)
Patient ID: Connie Fitzpatrick, female   DOB: 1928-03-03, 81 y.o.   MRN: [id number] S: patients family noted decreased ROM of left shoulder recently and nursing informed us of their desire to see if anything additional could be done for this. Patient denies issues with shoulder and states she has no pain in her shoulders.   O: PE MSK: patient with bilateral shoulder ROM that is decreased in abduction to 90 degrees in active and passive motion, she has mildly decreased ROM in external and internal rotation bilaterally, 5/5 grip strength, when attempting to complete the rest of the UE neuro exam patient stated "I don't want to do this anymore" and stopped cooperating.   A/P: patient with frozen shoulder based on exam. No complaints from patient. Will have patient see PT for further evaluation of this issue. Could consider shoulder inject in the future if this continues to be an issue.   Tommi Rumps, MD

## 2014-09-05 NOTE — Progress Notes (Signed)
I discussed with  Dr Wendi Snipes.  I agree with their plans documented in their regulatory visit note.

## 2014-09-05 NOTE — Progress Notes (Signed)
I discussed with  Dr Caryl Bis.  I agree with their plans documented in their acute visit note.

## 2014-09-10 ENCOUNTER — Non-Acute Institutional Stay (INDEPENDENT_AMBULATORY_CARE_PROVIDER_SITE_OTHER): Payer: Medicare Other | Admitting: Family Medicine

## 2014-09-10 ENCOUNTER — Encounter: Payer: Self-pay | Admitting: Family Medicine

## 2014-09-10 DIAGNOSIS — F329 Major depressive disorder, single episode, unspecified: Secondary | ICD-10-CM | POA: Diagnosis not present

## 2014-09-10 DIAGNOSIS — M7502 Adhesive capsulitis of left shoulder: Secondary | ICD-10-CM | POA: Diagnosis not present

## 2014-09-10 DIAGNOSIS — I10 Essential (primary) hypertension: Secondary | ICD-10-CM

## 2014-09-10 DIAGNOSIS — R011 Cardiac murmur, unspecified: Secondary | ICD-10-CM

## 2014-09-10 DIAGNOSIS — F0150 Vascular dementia without behavioral disturbance: Secondary | ICD-10-CM

## 2014-09-10 DIAGNOSIS — I38 Endocarditis, valve unspecified: Secondary | ICD-10-CM | POA: Insufficient documentation

## 2014-09-10 DIAGNOSIS — F32A Depression, unspecified: Secondary | ICD-10-CM

## 2014-09-10 NOTE — Assessment & Plan Note (Signed)
Slight improvement in left shoulder abduction per dgt and patient after starting PT No pain currently. Plan to continue PT

## 2014-09-10 NOTE — Assessment & Plan Note (Signed)
Adequate blood pressure control.  No evidence of new end organ damage.  Tolerating medication without significant adverse effects.  Plan to continue current blood pressure regiment.   

## 2014-09-10 NOTE — Assessment & Plan Note (Signed)
Likely Aortic regurgitation origin of early diastolic murmur. There was pulmonary Artery hypertension (mild) on echo 07/08/12.  Not symptomatic.

## 2014-09-10 NOTE — Progress Notes (Signed)
Patient ID: Connie Fitzpatrick, female   DOB: 08/30/1927, 81 y.o.   MRN: [id number] Connie Fitzpatrick  Visit  Primary Care Provider:  Location of Care: [facility] Visit Information: a scheduled routine follow-up visit Patient accompanied by patient and dgt Source(s) of information for visit: patient, family, nursing home and past medical records  Chief Complaint:  Chief Complaint  Patient presents with  . Regulatory Visit    Nursing Concerns: No concerns Nutrition Concerns: Mechanical Soft diet. Taking Ensure daily, Magic Cup twice daily , prostat 30 mL daily, Self-feeds with cueing, eating 50-100% of meals. Has own teeth with some missing. Wound Care Nurse Concerns: none PT / OT Concerns: Participating in Physical Therapy for left frozen shoulder.   HISTORY OF PRESENT ILLNESS:  RA Longstanding On prednisone 5 mg daily No abdominal pain, no indigestion No active pain or stiffness in hands or feet.   Dementia / Cognitive impairment - Onset: Diagnosis in 2012, Started on Donepezil - Change in ADLs / iADLs: dependent in all iADLs and needs assistance with dressing and bathing and toileting,  Needs cueing for eating but self-feeds. Able to propel WC with feet - No changes in personality or behaviors. -  no delusions. none hallucinations. - no aggression / inappropriate behaviors - no day / night disturbance - no wandering / pacing - no tearful / sad appearing - repeatedly asks about going home - Dementia Medications : Donepezil 10 mg since 2012, Memantine since  2014 - Adverse effects:(+) urinary incontinence.  None of these symptoms: syncope, urinary frequency,  nausea/diarrhea, anorexia, weight loss - Behavoral and Psychiatric Medications: none - MMSE or MoCa: 16/30 (2014) - BIMS (06/2014): 11/15 - PHQ9 ()06/2014): 0/27  Neuropathy of both feet - Longstanding issue - possibly RA-related peripheral neuropathy - Adequate pain control - Decreased sensation in feet  bilaterally - Taking gabapentin twice daily. No reports of daytime sedation. - On Duloxetine 30 mg daily for chronic pain.  Gout with Tophi - Longstanding problem - On febuxostat. Uric Acid 3.0 mg/dL on 04/07/14 - No recent flares of wrist or ankle pain.   Essential hypertension - Longstanding problem - Currently on metoprolol XL 50 mg daily  - No chest pain. No new weakness in limbs or difficulty with speech.  Personal History of Rheumatic Fever per dgt.   Left shoulder pain - longstanding stiffness. Longstanding history of Rheumatoid Arthritis - Participating in PT with some improvement in abduction per dgt - Able to dress with assistance - No pain causing limitation of shoulders.      Outpatient Encounter Prescriptions as of 09/10/2014  Medication Sig  . acetaminophen (TYLENOL) 650 MG CR tablet Take 650 mg by mouth 2 (two) times daily.   Marland Kitchen albuterol (PROVENTIL HFA;VENTOLIN HFA) 108 (90 BASE) MCG/ACT inhaler Inhale 2 puffs into the lungs every 4 (four) hours as needed. For shortness of breath  . Amino Acids-Protein Hydrolys (FEEDING SUPPLEMENT, PRO-STAT SUGAR FREE 64,) LIQD Take 30 mLs by mouth daily.  . bisacodyl (DULCOLAX) 5 MG EC tablet Take 5 mg by mouth daily as needed (for constipation).   . calcium citrate (CALCITRATE - DOSED IN MG ELEMENTAL CALCIUM) 950 MG tablet Take 400 mg of elemental calcium by mouth daily.  Marland Kitchen donepezil (ARICEPT) 10 MG tablet Take 10 mg by mouth daily with breakfast.  . DULoxetine (CYMBALTA) 30 MG capsule Take 30 mg by mouth daily at 8 pm.   . ENSURE (ENSURE) Take 237 mLs by mouth daily before lunch.   . febuxostat (  ULORIC) 40 MG tablet Take 40 mg by mouth daily.  Marland Kitchen gabapentin (NEURONTIN) 300 MG capsule Take 300 mg by mouth 2 (two) times daily.  Marland Kitchen levothyroxine (SYNTHROID, LEVOTHROID) 75 MCG tablet Take 1 tablet (75 mcg total) by mouth daily with breakfast. (Patient taking differently: Take 75 mcg by mouth daily. At 4pm)  . loratadine  (CLARITIN) 10 MG tablet Take 10 mg by mouth daily.  . memantine (NAMENDA) 10 MG tablet Take 10 mg by mouth 2 (two) times daily.  . Multiple Vitamin (MULTIVITAMIN WITH MINERALS) TABS tablet Take 1 tablet by mouth daily at 8 pm.  . multivitamin-lutein (OCUVITE-LUTEIN) CAPS Take 1 capsule by mouth daily with breakfast.  . predniSONE (DELTASONE) 5 MG tablet Take 5 mg by mouth daily.  . Vitamin D, Ergocalciferol, (DRISDOL) 50000 UNITS CAPS Take 50,000 Units by mouth every 30 (thirty) days.  . [DISCONTINUED] vitamin E (VITAMIN E) 400 UNIT capsule Take 1 capsule (400 Units total) by mouth daily.   Allergies  Allergen Reactions  . Enalapril Maleate Anaphylaxis and Swelling    From [facility]  . Azithromycin     From [facility]  . Morphine And Related Other (See Comments)    Possible hallucinations ( 07/12) skin crawling off of body. From [facility]  . Nsaids     Ulcer on EGD 01/01/10 - stopped celebrex From [facility]  . Valacyclovir Hcl     From [facility]  . Allopurinol Rash    From [facility]   History Patient Active Problem List   Diagnosis Date Noted  . Diastolic murmur 11/22/7626  . Frozen shoulder, bilateral 08/22/2014  . Asymptomatic bacteriuria 08/17/2014  . Cervical spondylosis without myelopathy 05/15/2014  . Dysphagia, oropharyngeal 12/15/2013  . Frequent falls 09/01/2013  . Neuropathy of both feet 07/12/2013  . Nursing home resident 05/10/2013  . Wheelchair dependent 06/29/2012  . Dementia, vascular 05/02/2012  . Gout synovitis   . Hypothyroidism 08/13/2010  . Rheumatoid arthritis 08/13/2010  . HLD (hyperlipidemia) 08/11/2010  . Essential hypertension 08/11/2010  . Allergic rhinitis 08/11/2010  . Asthma 08/11/2010  . GERD 08/11/2010  . URINARY INCONTINENCE 08/11/2010  . Depression 08/04/2010  . Osteoarthritis 08/04/2010  . Insomnia 08/04/2010   Past Medical History  Diagnosis Date  . Gout   . INSOMNIA   . ALLERGIC RHINITIS   . ASTHMA   . OSTEOARTHRITIS   . Rheumatoid arthritis(714.0)   .  HYPERTENSION   . HYPERLIPIDEMIA   . GERD (gastroesophageal reflux disease)   . Depression   . ALCOHOL ABUSE     hx detox, dry since 2010  . HYPOTHYROIDISM   . URINARY INCONTINENCE   . Dementia   . Recurrent falls   . SDH (subdural hematoma)     a. 2008 in setting of fall.  . Chronic pain   . Perforated small intestine     a. From swallowed foreign body - s/p exlap with small bowel resection 09/2011.  Marland Kitchen Dysphagia, oropharyngeal 12/15/2013  . Stroke, lacunar     Old lacunar infarct left basal ganglia   . Chronic dislocation of right shoulder 05/15/2014  . Cervical spondylosis without myelopathy 05/15/2014    05/02/14 Cervical spine CT: Severe degenerative disc disease is noted at C3-4, C4-5, C5-6 and C6-7. Erosive degenerative changes are again noted involving the odontoid process which are unchanged compared to prior exam. Degenerative changes seen involving the posterior facet joints at multiple levels posteriorly.  . Orthostatic hypotension 07/28/2012  . Trigger finger, acquired 07/12/2012    Left 3rd, symptomatic   .  KNEE REPLACEMENT, BILATERAL, HX OF 08/04/2010    Qualifier: Diagnosis of  By: Marca Ancona RMA, Lucy    . History of alcohol abuse 08/04/2010    Qualifier: Diagnosis of  By: Marca Ancona RMA, Lucy    . Asthma 08/11/2010    Qualifier: Diagnosis of  By: Asa Lente MD, Jannifer Rodney Allergic rhinitis 08/11/2010    Qualifier: Diagnosis of  By: Asa Lente MD, Jannifer Rodney H/O: rheumatic fever    Past Surgical History  Procedure Laterality Date  . Abdominal hysterectomy    . Breast surgery    . Total knee arthroplasty    . Vaginal prolapse repair    . Total knee arthroplasty  07/2002    right  . Total knee arthroplasty  09/1998    Left  . Cataract extraction  2001  . L-spine  1998  . Colpocleisis vaginal [place] fort  06/2010  . Laparotomy  10/26/2011    Procedure: EXPLORATORY LAPAROTOMY;  Surgeon: Imogene Burn. Georgette Dover, MD;  Location: [place];  Service: General;  Laterality: N/A;  . Bowel resection   10/26/2011    Procedure: SMALL BOWEL RESECTION;  Surgeon: Imogene Burn. Georgette Dover, MD;  Location: [place];  Service: General;  Laterality: N/A;  . Carpal tunnel release   before 2008  . Lumbar laminectomy  2007    Dr Joya Salm   Family History  Problem Relation Age of Onset  . Arthritis Mother   . Diabetes Mother   . Hypertension Mother   . Lung cancer Father   . Stroke Father     reports that she has never smoked. She does not have any smokeless tobacco history on file. She reports that she does not drink alcohol or use illicit drugs.  Basic Activities of Daily Living   ADLs Independent Needs Assistance Dependent  Bathing   x  Dressing   x  Ambulation X (propels WC w/feet    Toileting  x   Eating  x      Falls in the past six months:   yes  Diet:  Mechanical soft Supplemental shakes:  yes  Review of Systems  Patient has ability to communicate answers to ROS: yes See HPI  Geriatric Syndromes: Constipation no ,   Incontinence yes  Dizziness no   Syncope no   Skin problems No   Visual Impairment yes   Hearing impairment no  Eating impairment needs cueing and assistance  Impaired Memory or Cognition yes   Behavioral problems no   Sleep problems no Weight loss no        .  Cardiovascular: dyspnea on exertion, orthopnea, peripheral edema.  Respiratory: Denies cough,   Gastrointestinal: Denies abd pain, constipation, diarrhea.  Musculoskeletal: Denies joint swelling, weakness.   PHYSICAL EXAM:. Wt Readings from Last 3 Encounters:  09/05/14 105 lb 6.4 oz (47.809 kg)  07/12/14 104 lb 3.2 oz (47.265 kg)  05/01/14 104 lb 3.2 oz (47.265 kg)   Temp Readings from Last 3 Encounters:  09/01/14 97.3 F (36.3 C)   08/11/14 96.6 F (35.9 C)   07/19/14 96.8 F (36 C)    BP Readings from Last 3 Encounters:  09/01/14 131/89  08/11/14 170/92  07/19/14 151/64   Pulse Readings from Last 3 Encounters:  09/01/14 79  08/11/14 68  07/19/14 61    General: alert, cooperative,  no distress, well nourished, pleasant, clean, groomed HEENT:  No scleral icterus, no nasal secretions, Fair dentition Neck:  Supple, No JVD, no lymphadenopathy CV:  RRR, Early diastolic murmur left lateral sternal border, no JVD, no gallup RESP: No resp distress or accessory muscle use.  Clear to ausc bilat. No wheezing, no rales, no rhonchi.  ABD:  Soft, Non-tender,  +bowel sounds,  EXT: Warm and well perfused   no edema, wearing support hose.  Left foreleg chronically larger than right  Gait:  In WC Neurologic: Muscle Tone within normal limits; Psych: Attention Normal;  Mood appropriate; Speech normal; Language barrier none ; Language concrete, moments of perseveration.     MMSE - Mini Mental State Exam 03/28/2013 04/13/2012  Orientation to time 5 4  Orientation to Place 2 5  Registration 1 3  Attention/ Calculation 0 2  Recall 0 1  Language- name 2 objects 1 2  Language- repeat 1 1  Language- follow 3 step command 3 3  Language- read & follow direction 1 1  Write a sentence 1 1  Copy design 1 1  Total score 16 24     BIMS (04/15/14)  11/15 PHQ-9 (04/15/14) 0/27 Years of Education: 16 +  Assessment and Plan:   See Problem List for individual problem's assessment and plans.      Code Status: Limited Code Pt has an advance directive - Advanced Directive (signed 05/09/1998) scanned into [employer] document (signed and notarized 05/09/1998) scanned into [employer] EMR. Connie Fitzpatrick designated HCPOA agent by patient.  Follow Up:  Next 60 days unless acute issues arise.

## 2014-09-10 NOTE — Assessment & Plan Note (Signed)
Stable. Tolerating Cymbalta.  Continue Cymbalta 30 mg daily.  May titrate up to 60 mg if needed for neuropathy or mood disturbance.

## 2014-09-10 NOTE — Assessment & Plan Note (Signed)
FAST Stage: 6 (moderate-severe) Palliative Performance Score: 40%. Progression since last assessment: none Behavioral and Psychological Symptoms of Dementia: none  Pharmacologic treatments of patient: Aricept for ~3 years, Memantine for ~2 years. Response: Unknown Adverse effects from pharmacologic treatments: urinary incontinence, though probably not only source of this condition  Once Dementia stage 7 with start of loss of language, would try trial off of anti-dementia medications

## 2014-09-21 ENCOUNTER — Encounter: Payer: Self-pay | Admitting: Pharmacist

## 2014-09-26 ENCOUNTER — Non-Acute Institutional Stay: Payer: Medicare Other | Admitting: Family Medicine

## 2014-09-26 DIAGNOSIS — J029 Acute pharyngitis, unspecified: Secondary | ICD-10-CM

## 2014-09-26 NOTE — Progress Notes (Signed)
Connie Fitzpatrick is a 81 y.o. female seen in nursing home today for sore throat.  Pharyngitis - Ongoing now for 2 days,denies fever/chills/sweats.  Has not tried anything for this at this point but has noticed slight improvement since beginning.  Denies dysphagia or intermittent sore throat (more constant for her).  Slight postnasal drip but denies other allergic rhinitis Sx.  No LAD per her report.     Current Outpatient Prescriptions on File Prior to Visit  Medication Sig Dispense Refill  . acetaminophen (TYLENOL) 650 MG CR tablet Take 650 mg by mouth 2 (two) times daily.     Marland Kitchen albuterol (PROVENTIL HFA;VENTOLIN HFA) 108 (90 BASE) MCG/ACT inhaler Inhale 2 puffs into the lungs every 4 (four) hours as needed. For shortness of breath    . Amino Acids-Protein Hydrolys (FEEDING SUPPLEMENT, PRO-STAT SUGAR FREE 64,) LIQD Take 30 mLs by mouth daily.    . bisacodyl (DULCOLAX) 5 MG EC tablet Take 5 mg by mouth daily as needed (for constipation).     . calcium citrate (CALCITRATE - DOSED IN MG ELEMENTAL CALCIUM) 950 MG tablet Take 400 mg of elemental calcium by mouth daily.    Marland Kitchen donepezil (ARICEPT) 10 MG tablet Take 10 mg by mouth daily with breakfast.    . DULoxetine (CYMBALTA) 30 MG capsule Take 30 mg by mouth daily at 8 pm.     . ENSURE (ENSURE) Take 237 mLs by mouth daily before lunch.     . febuxostat (ULORIC) 40 MG tablet Take 40 mg by mouth daily.    Marland Kitchen gabapentin (NEURONTIN) 300 MG capsule Take 300 mg by mouth 2 (two) times daily.    Marland Kitchen levothyroxine (SYNTHROID, LEVOTHROID) 75 MCG tablet Take 1 tablet (75 mcg total) by mouth daily with breakfast. (Patient taking differently: Take 75 mcg by mouth daily. At 4pm) 30 tablet 0  . loratadine (CLARITIN) 10 MG tablet Take 10 mg by mouth daily.    . memantine (NAMENDA) 10 MG tablet Take 10 mg by mouth 2 (two) times daily.    . Multiple Vitamin (MULTIVITAMIN WITH MINERALS) TABS tablet Take 1 tablet by mouth daily at 8 pm.    . multivitamin-lutein (OCUVITE-LUTEIN)  CAPS Take 1 capsule by mouth daily with breakfast.    . predniSONE (DELTASONE) 5 MG tablet Take 5 mg by mouth daily.    . Vitamin D, Ergocalciferol, (DRISDOL) 50000 UNITS CAPS Take 50,000 Units by mouth every 30 (thirty) days.     No current facility-administered medications on file prior to visit.    ROS: Per HPI.  All other systems reviewed and are negative.   Physical Exam Filed Vitals:   09/26/14 1125  BP: 148/87  Pulse: 95  Temp: 98.1 F (36.7 C)  Resp: 16    Physical Examination: General appearance - alert, well appearing, and in no distress Nose - normal and patent, no erythema, discharge or polyps Mouth - MMM, O/P clear Neck - supple, no significant adenopathy Chest - clear to auscultation, no wheezes, rales or rhonchi, symmetric air entry Heart - normal rate and regular rhythm  A/P 1) Pharyngitis - Not concerning for bacterial on exam.  Most likely viral in origin or postnasal drip as nidus.  Recommend Sx tx including Mucinex 600 mg ES BID, Tylenol 650 mg TID, tea w/ honey and lozenges PRN.  F/U if no improvement.

## 2014-09-28 NOTE — Progress Notes (Signed)
I have seen and examined this patient with Dr Awanda Mink. I have reviewed labs and imaging results.  I have discussed with Dr Awanda Mink.  I agree with their findings and plans as documented in their acute visit note.

## 2014-09-28 NOTE — Addendum Note (Signed)
Addended byWendy Poet, Carsynn Bethune D on: 09/28/2014 11:56 AM   Modules accepted: Level of Service

## 2014-10-15 ENCOUNTER — Encounter (HOSPITAL_COMMUNITY): Payer: Self-pay | Admitting: Emergency Medicine

## 2014-10-15 ENCOUNTER — Inpatient Hospital Stay (HOSPITAL_COMMUNITY): Payer: Medicare Other

## 2014-10-15 ENCOUNTER — Emergency Department (HOSPITAL_COMMUNITY): Payer: Medicare Other

## 2014-10-15 ENCOUNTER — Non-Acute Institutional Stay: Payer: Medicare Other | Admitting: Family Medicine

## 2014-10-15 ENCOUNTER — Inpatient Hospital Stay (HOSPITAL_COMMUNITY)
Admission: EM | Admit: 2014-10-15 | Discharge: 2014-10-31 | DRG: 388 | Disposition: E | Payer: Medicare Other | Attending: Family Medicine | Admitting: Family Medicine

## 2014-10-15 ENCOUNTER — Encounter: Payer: Self-pay | Admitting: Family Medicine

## 2014-10-15 ENCOUNTER — Encounter (HOSPITAL_COMMUNITY): Admission: EM | Disposition: E | Payer: Self-pay | Source: Home / Self Care | Attending: Family Medicine

## 2014-10-15 DIAGNOSIS — F0390 Unspecified dementia without behavioral disturbance: Secondary | ICD-10-CM

## 2014-10-15 DIAGNOSIS — R630 Anorexia: Secondary | ICD-10-CM | POA: Diagnosis present

## 2014-10-15 DIAGNOSIS — R296 Repeated falls: Secondary | ICD-10-CM | POA: Diagnosis present

## 2014-10-15 DIAGNOSIS — Z515 Encounter for palliative care: Secondary | ICD-10-CM

## 2014-10-15 DIAGNOSIS — Z9049 Acquired absence of other specified parts of digestive tract: Secondary | ICD-10-CM | POA: Diagnosis present

## 2014-10-15 DIAGNOSIS — Z885 Allergy status to narcotic agent status: Secondary | ICD-10-CM

## 2014-10-15 DIAGNOSIS — Z66 Do not resuscitate: Secondary | ICD-10-CM | POA: Diagnosis present

## 2014-10-15 DIAGNOSIS — I959 Hypotension, unspecified: Secondary | ICD-10-CM | POA: Diagnosis present

## 2014-10-15 DIAGNOSIS — F0150 Vascular dementia without behavioral disturbance: Secondary | ICD-10-CM | POA: Diagnosis present

## 2014-10-15 DIAGNOSIS — K5660 Unspecified intestinal obstruction: Principal | ICD-10-CM | POA: Diagnosis present

## 2014-10-15 DIAGNOSIS — Z886 Allergy status to analgesic agent status: Secondary | ICD-10-CM

## 2014-10-15 DIAGNOSIS — K5669 Other intestinal obstruction: Secondary | ICD-10-CM

## 2014-10-15 DIAGNOSIS — K56609 Unspecified intestinal obstruction, unspecified as to partial versus complete obstruction: Secondary | ICD-10-CM | POA: Diagnosis present

## 2014-10-15 DIAGNOSIS — K559 Vascular disorder of intestine, unspecified: Secondary | ICD-10-CM | POA: Diagnosis present

## 2014-10-15 DIAGNOSIS — Z888 Allergy status to other drugs, medicaments and biological substances status: Secondary | ICD-10-CM

## 2014-10-15 DIAGNOSIS — M064 Inflammatory polyarthropathy: Secondary | ICD-10-CM

## 2014-10-15 DIAGNOSIS — M109 Gout, unspecified: Secondary | ICD-10-CM | POA: Diagnosis present

## 2014-10-15 DIAGNOSIS — K6389 Other specified diseases of intestine: Secondary | ICD-10-CM | POA: Diagnosis present

## 2014-10-15 DIAGNOSIS — Z881 Allergy status to other antibiotic agents status: Secondary | ICD-10-CM | POA: Diagnosis not present

## 2014-10-15 DIAGNOSIS — Z993 Dependence on wheelchair: Secondary | ICD-10-CM

## 2014-10-15 DIAGNOSIS — J45909 Unspecified asthma, uncomplicated: Secondary | ICD-10-CM | POA: Diagnosis present

## 2014-10-15 DIAGNOSIS — M069 Rheumatoid arthritis, unspecified: Secondary | ICD-10-CM | POA: Diagnosis present

## 2014-10-15 DIAGNOSIS — I2109 ST elevation (STEMI) myocardial infarction involving other coronary artery of anterior wall: Secondary | ICD-10-CM | POA: Diagnosis present

## 2014-10-15 DIAGNOSIS — Z8249 Family history of ischemic heart disease and other diseases of the circulatory system: Secondary | ICD-10-CM

## 2014-10-15 DIAGNOSIS — I011 Acute rheumatic endocarditis: Secondary | ICD-10-CM | POA: Insufficient documentation

## 2014-10-15 DIAGNOSIS — I10 Essential (primary) hypertension: Secondary | ICD-10-CM | POA: Diagnosis present

## 2014-10-15 DIAGNOSIS — K219 Gastro-esophageal reflux disease without esophagitis: Secondary | ICD-10-CM | POA: Diagnosis present

## 2014-10-15 DIAGNOSIS — Z7952 Long term (current) use of systemic steroids: Secondary | ICD-10-CM

## 2014-10-15 DIAGNOSIS — F439 Reaction to severe stress, unspecified: Secondary | ICD-10-CM | POA: Diagnosis present

## 2014-10-15 DIAGNOSIS — F329 Major depressive disorder, single episode, unspecified: Secondary | ICD-10-CM | POA: Diagnosis present

## 2014-10-15 DIAGNOSIS — Z79899 Other long term (current) drug therapy: Secondary | ICD-10-CM | POA: Diagnosis not present

## 2014-10-15 DIAGNOSIS — R55 Syncope and collapse: Secondary | ICD-10-CM | POA: Diagnosis present

## 2014-10-15 DIAGNOSIS — Z8673 Personal history of transient ischemic attack (TIA), and cerebral infarction without residual deficits: Secondary | ICD-10-CM | POA: Diagnosis not present

## 2014-10-15 DIAGNOSIS — E860 Dehydration: Secondary | ICD-10-CM | POA: Diagnosis present

## 2014-10-15 DIAGNOSIS — Z823 Family history of stroke: Secondary | ICD-10-CM

## 2014-10-15 DIAGNOSIS — Z593 Problems related to living in residential institution: Secondary | ICD-10-CM

## 2014-10-15 DIAGNOSIS — Z9849 Cataract extraction status, unspecified eye: Secondary | ICD-10-CM | POA: Diagnosis not present

## 2014-10-15 DIAGNOSIS — I9589 Other hypotension: Secondary | ICD-10-CM | POA: Diagnosis not present

## 2014-10-15 DIAGNOSIS — E039 Hypothyroidism, unspecified: Secondary | ICD-10-CM | POA: Diagnosis present

## 2014-10-15 DIAGNOSIS — Z96653 Presence of artificial knee joint, bilateral: Secondary | ICD-10-CM | POA: Diagnosis present

## 2014-10-15 DIAGNOSIS — R1031 Right lower quadrant pain: Secondary | ICD-10-CM

## 2014-10-15 DIAGNOSIS — E785 Hyperlipidemia, unspecified: Secondary | ICD-10-CM | POA: Diagnosis present

## 2014-10-15 DIAGNOSIS — R109 Unspecified abdominal pain: Secondary | ICD-10-CM | POA: Diagnosis present

## 2014-10-15 DIAGNOSIS — R9431 Abnormal electrocardiogram [ECG] [EKG]: Secondary | ICD-10-CM | POA: Insufficient documentation

## 2014-10-15 DIAGNOSIS — R0989 Other specified symptoms and signs involving the circulatory and respiratory systems: Secondary | ICD-10-CM | POA: Diagnosis not present

## 2014-10-15 DIAGNOSIS — Z90710 Acquired absence of both cervix and uterus: Secondary | ICD-10-CM

## 2014-10-15 HISTORY — DX: Unspecified intestinal obstruction, unspecified as to partial versus complete obstruction: K56.609

## 2014-10-15 HISTORY — DX: Family history of other specified conditions: Z84.89

## 2014-10-15 LAB — CBC WITH DIFFERENTIAL/PLATELET
Basophils Absolute: 0 10*3/uL (ref 0.0–0.1)
Basophils Relative: 0 % (ref 0–1)
Eosinophils Absolute: 0 10*3/uL (ref 0.0–0.7)
Eosinophils Relative: 0 % (ref 0–5)
HEMATOCRIT: 51.7 % — AB (ref 36.0–46.0)
HEMOGLOBIN: 16.8 g/dL — AB (ref 12.0–15.0)
LYMPHS ABS: 2.3 10*3/uL (ref 0.7–4.0)
Lymphocytes Relative: 13 % (ref 12–46)
MCH: 32.3 pg (ref 26.0–34.0)
MCHC: 32.5 g/dL (ref 30.0–36.0)
MCV: 99.4 fL (ref 78.0–100.0)
MONO ABS: 1.2 10*3/uL — AB (ref 0.1–1.0)
MONOS PCT: 7 % (ref 3–12)
NEUTROS PCT: 80 % — AB (ref 43–77)
Neutro Abs: 13.5 10*3/uL — ABNORMAL HIGH (ref 1.7–7.7)
Platelets: 313 10*3/uL (ref 150–400)
RBC: 5.2 MIL/uL — AB (ref 3.87–5.11)
RDW: 12.6 % (ref 11.5–15.5)
WBC: 17.1 10*3/uL — AB (ref 4.0–10.5)

## 2014-10-15 LAB — I-STAT TROPONIN, ED: TROPONIN I, POC: 0.01 ng/mL (ref 0.00–0.08)

## 2014-10-15 LAB — BASIC METABOLIC PANEL
Anion gap: 16 — ABNORMAL HIGH (ref 5–15)
BUN: 38 mg/dL — AB (ref 6–20)
CALCIUM: 9.7 mg/dL (ref 8.9–10.3)
CHLORIDE: 107 mmol/L (ref 101–111)
CO2: 20 mmol/L — AB (ref 22–32)
Creatinine, Ser: 1.37 mg/dL — ABNORMAL HIGH (ref 0.44–1.00)
GFR calc Af Amer: 40 mL/min — ABNORMAL LOW (ref >60)
GFR calc non Af Amer: 34 mL/min — ABNORMAL LOW (ref >60)
Glucose, Bld: 209 mg/dL — ABNORMAL HIGH (ref 65–99)
Potassium: 4.3 mmol/L (ref 3.5–5.1)
Sodium: 143 mmol/L (ref 135–145)

## 2014-10-15 LAB — TROPONIN I: Troponin I: 0.04 ng/mL — ABNORMAL HIGH (ref <0.031)

## 2014-10-15 LAB — MRSA PCR SCREENING: MRSA by PCR: NEGATIVE

## 2014-10-15 SURGERY — LEFT HEART CATH AND CORONARY ANGIOGRAPHY

## 2014-10-15 MED ORDER — SODIUM CHLORIDE 0.9 % IV SOLN
INTRAVENOUS | Status: DC
  Administered 2014-10-15 (×2): via INTRAVENOUS

## 2014-10-15 MED ORDER — ONDANSETRON HCL 4 MG PO TABS
4.0000 mg | ORAL_TABLET | Freq: Four times a day (QID) | ORAL | Status: DC | PRN
Start: 2014-10-15 — End: 2014-10-16

## 2014-10-15 MED ORDER — METHYLPREDNISOLONE SODIUM SUCC 125 MG IJ SOLR
60.0000 mg | Freq: Once | INTRAMUSCULAR | Status: AC
  Administered 2014-10-15: 60 mg via INTRAVENOUS
  Filled 2014-10-15: qty 2

## 2014-10-15 MED ORDER — SODIUM CHLORIDE 0.9 % IJ SOLN
3.0000 mL | Freq: Two times a day (BID) | INTRAMUSCULAR | Status: DC
  Administered 2014-10-15: 3 mL via INTRAVENOUS

## 2014-10-15 MED ORDER — SODIUM CHLORIDE 0.9 % IV BOLUS (SEPSIS)
1000.0000 mL | Freq: Once | INTRAVENOUS | Status: AC
  Administered 2014-10-15: 1000 mL via INTRAVENOUS

## 2014-10-15 MED ORDER — SODIUM CHLORIDE 0.9 % IV BOLUS (SEPSIS)
500.0000 mL | Freq: Once | INTRAVENOUS | Status: AC
  Administered 2014-10-15: 500 mL via INTRAVENOUS

## 2014-10-15 MED ORDER — HEPARIN SODIUM (PORCINE) 5000 UNIT/ML IJ SOLN
5000.0000 [IU] | Freq: Three times a day (TID) | INTRAMUSCULAR | Status: DC
  Filled 2014-10-15 (×3): qty 1

## 2014-10-15 MED ORDER — ONDANSETRON HCL 4 MG/2ML IJ SOLN: 4.0000 mg | Freq: Four times a day (QID) | INTRAMUSCULAR | Status: DC | PRN

## 2014-10-15 MED ORDER — IOHEXOL 300 MG/ML  SOLN
70.0000 mL | Freq: Once | INTRAMUSCULAR | Status: AC | PRN
  Administered 2014-10-15: 70 mL via INTRAVENOUS

## 2014-10-15 NOTE — H&P (Signed)
[facility] Admission History and Physical Service Pager: [phone]  Patient name: Connie Fitzpatrick Medical record number: [id number] Date of birth: 1927/08/21 Age: 81 y.o. Gender: female  Primary Care Provider: Kenn File, MD Consultants: Cardiology, General surgery Code Status: DNR  Chief Complaint: stomach pain  Assessment and Plan: Connie Fitzpatrick is a 81 y.o. female presenting with abdominal pain and SBO. PMH is significant for dementia, prior perforated small intestine from ingestion of foreign body s/p ex-lap, asthma, ?history of rheumatic fever, hypothyroidism, alcohol abuse, GERD, rheumatoid arthritis   # Small bowel obstruction: vitals with hypotension to 59F systolic (though most recent improved). KUB at [facility] and here showing evidence of SBO; pt has risk factor of prior small bowel perf from ingested foreign body and ex-lap so likely has surgical adhesions. Of note labs also showing leukocytosis, but feel this does not represent infection but rather stress reaction to SBO and suspected STEMI; she is afebrile. - admit to inpatient - NG tube to be placed in ED, intermittent suction - bowel rest - NS @ 125 cc/hr - will hold on antibiotics for now, trend fever curve - repeat KUB, labs in AM - appreciate gen surg recs, likely not a surgical candidate... Obtain CT abdomen.  # Hypotension: multifactorial SBO/STEMI. Need to keep sepsis on differential and as above, will reconsider antibiotics.  - bolus IVF as needed - NS @ 125cc/hr   # Probable STEMI: anterolateral ST elevation on EKG in ED new compared to prior, initially brought up to cath lab but in light of current status family and cardiology felt she should be managed medically. Initial i-stat troponin 0.01. CXR with no acute disease. - trend troponins, if trending up will discus with family if would want to anticoagulate.  - repeat EKG In AM  # Hypothyroidism - hold synthroid dose while  NPO  # Rheumatoid arthritis/prednisone use: appears per EMR to have been on 5mg  prednisone daily since 2013.  - consider stress dose of steroids, IV conversion since she will be NPO and unable to take prednisone  Chronic medical issues: # Depression: - hold duloxetine # Dementia:  - hold namenda and aricept  FEN/GI: NPO / NS @ 125cc/hr Prophylaxis: heparin sq  Disposition: admit  History of Present Illness: Connie Fitzpatrick is a 81 y.o. female presenting with abdominal pain. History is limited due to dementia, most of history is per daughter at the bedside. Pt is a resident at [facility] and was complaining of abdominal pain x 1 day with gradual onset, located all over and pt unable to localize. Last reported to have a very small BM yesterday, no blood or dark stool noted. She did eat breakfast this morning. She does not complain of any chest pain, and has not had a cough or felt short of breath. She has not been eating as much due to poor appetite, but daughter states she hasn't vomited.   Review Of Systems:  ROS limited by baseline dementia, but pt did not endorse any fevers or chills, no cough, no CP  Patient Active Problem List   Diagnosis Date Noted  . Abdominal pain 10/12/2014  . Diastolic murmur 63/84/6659  . Frozen shoulder, bilateral 08/22/2014  . Asymptomatic bacteriuria 08/17/2014  . Cervical spondylosis without myelopathy 05/15/2014  . Dysphagia, oropharyngeal 12/15/2013  . Frequent falls 09/01/2013  . Neuropathy of both feet 07/12/2013  . Nursing home resident 05/10/2013  . Wheelchair dependent 06/29/2012  . Dementia, vascular 05/02/2012  . Gout synovitis   .  Hypothyroidism 08/13/2010  . Rheumatoid arthritis 08/13/2010  . HLD (hyperlipidemia) 08/11/2010  . Essential hypertension 08/11/2010  . Allergic rhinitis 08/11/2010  . Asthma 08/11/2010  . GERD 08/11/2010  . URINARY INCONTINENCE 08/11/2010  . Depression 08/04/2010  . Osteoarthritis 08/04/2010  . Insomnia  08/04/2010   Past Medical History: Past Medical History  Diagnosis Date  . Gout   . INSOMNIA   . ALLERGIC RHINITIS   . ASTHMA   . OSTEOARTHRITIS   . Rheumatoid arthritis(714.0)   . HYPERTENSION   . HYPERLIPIDEMIA   . GERD (gastroesophageal reflux disease)   . Depression   . ALCOHOL ABUSE     hx detox, dry since 2010  . HYPOTHYROIDISM   . URINARY INCONTINENCE   . Dementia   . Recurrent falls   . SDH (subdural hematoma)     a. 2008 in setting of fall.  . Chronic pain   . Perforated small intestine     a. From swallowed foreign body - s/p exlap with small bowel resection 09/2011.  Marland Kitchen Dysphagia, oropharyngeal 12/15/2013  . Stroke, lacunar     Old lacunar infarct left basal ganglia   . Chronic dislocation of right shoulder 05/15/2014  . Cervical spondylosis without myelopathy 05/15/2014    05/02/14 Cervical spine CT: Severe degenerative disc disease is noted at C3-4, C4-5, C5-6 and C6-7. Erosive degenerative changes are again noted involving the odontoid process which are unchanged compared to prior exam. Degenerative changes seen involving the posterior facet joints at multiple levels posteriorly.  . Orthostatic hypotension 07/28/2012  . Trigger finger, acquired 07/12/2012    Left 3rd, symptomatic   . KNEE REPLACEMENT, BILATERAL, HX OF 08/04/2010    Qualifier: Diagnosis of  By: Marca Ancona RMA, Lucy    . History of alcohol abuse 08/04/2010    Qualifier: Diagnosis of  By: Marca Ancona RMA, Lucy    . Asthma 08/11/2010    Qualifier: Diagnosis of  By: Asa Lente MD, Jannifer Rodney Allergic rhinitis 08/11/2010    Qualifier: Diagnosis of  By: Asa Lente MD, Jannifer Rodney H/O: rheumatic fever    Past Surgical History: Past Surgical History  Procedure Laterality Date  . Abdominal hysterectomy    . Breast surgery    . Total knee arthroplasty    . Vaginal prolapse repair    . Total knee arthroplasty  07/2002    right  . Total knee arthroplasty  09/1998    Left  . Cataract extraction  2001  . L-spine  1998   . Colpocleisis vaginal le fort  06/2010  . Laparotomy  10/26/2011    Procedure: EXPLORATORY LAPAROTOMY;  Surgeon: Imogene Burn. Georgette Dover, MD;  Location: [place];  Service: General;  Laterality: N/A;  . Bowel resection  10/26/2011    Procedure: SMALL BOWEL RESECTION;  Surgeon: Imogene Burn. Georgette Dover, MD;  Location: [place];  Service: General;  Laterality: N/A;  . Carpal tunnel release   before 2008  . Lumbar laminectomy  2007    Dr Joya Salm   Social History: History  Substance Use Topics  . Smoking status: Never Smoker   . Smokeless tobacco: Not on file  . Alcohol Use: No     Comment: Previous hx of alcohol abuse   Additional social history: came from [facility]  Please also refer to relevant sections of EMR.  Family History: Family History  Problem Relation Age of Onset  . Arthritis Mother   . Diabetes Mother   . Hypertension Mother   .  Lung cancer Father   . Stroke Father    Allergies and Medications: Allergies  Allergen Reactions  . Enalapril Maleate Anaphylaxis and Swelling    From [facility]  . Azithromycin     From [facility]  . Morphine And Related Other (See Comments)    Possible hallucinations ( 07/12) skin crawling off of body. From [facility]  . Nsaids     Ulcer on EGD 01/01/10 - stopped celebrex From [facility]  . Valacyclovir Hcl     From [facility]  . Allopurinol Rash    From [facility]   No current facility-administered medications on file prior to encounter.   Current Outpatient Prescriptions on File Prior to Encounter  Medication Sig Dispense Refill  . acetaminophen (TYLENOL) 650 MG CR tablet Take 650 mg by mouth 2 (two) times daily.     Marland Kitchen albuterol (PROVENTIL HFA;VENTOLIN HFA) 108 (90 BASE) MCG/ACT inhaler Inhale 2 puffs into the lungs every 4 (four) hours as needed. For shortness of breath    . Amino Acids-Protein Hydrolys (FEEDING SUPPLEMENT, PRO-STAT SUGAR FREE 64,) LIQD Take 30 mLs by mouth daily.    . bisacodyl (DULCOLAX) 5 MG EC tablet Take 5 mg by mouth daily as needed (for constipation).     .  calcium citrate (CALCITRATE - DOSED IN MG ELEMENTAL CALCIUM) 950 MG tablet Take 400 mg of elemental calcium by mouth daily.    Marland Kitchen donepezil (ARICEPT) 10 MG tablet Take 10 mg by mouth daily with breakfast.    . DULoxetine (CYMBALTA) 30 MG capsule Take 30 mg by mouth daily at 8 pm.     . ENSURE (ENSURE) Take 237 mLs by mouth daily before lunch.     . febuxostat (ULORIC) 40 MG tablet Take 40 mg by mouth daily.    Marland Kitchen gabapentin (NEURONTIN) 300 MG capsule Take 300 mg by mouth 2 (two) times daily.    Marland Kitchen levothyroxine (SYNTHROID, LEVOTHROID) 75 MCG tablet Take 1 tablet (75 mcg total) by mouth daily with breakfast. (Patient taking differently: Take 75 mcg by mouth daily. At 4pm) 30 tablet 0  . loratadine (CLARITIN) 10 MG tablet Take 10 mg by mouth daily.    . memantine (NAMENDA) 10 MG tablet Take 10 mg by mouth 2 (two) times daily.    . Multiple Vitamin (MULTIVITAMIN WITH MINERALS) TABS tablet Take 1 tablet by mouth daily at 8 pm.    . multivitamin-lutein (OCUVITE-LUTEIN) CAPS Take 1 capsule by mouth daily with breakfast.    . predniSONE (DELTASONE) 5 MG tablet Take 5 mg by mouth daily.    . Vitamin D, Ergocalciferol, (DRISDOL) 50000 UNITS CAPS Take 50,000 Units by mouth every 30 (thirty) days.      Objective: BP 119/72 mmHg  Pulse 106  Resp 25  SpO2 95% Exam: General: NAD Eyes: PERRL, EOMI. Sclera anicteric ENTM: Normal appearance of nares, no oropharyngeal lesions Neck: supple, no thyromegaly noted Cardiovascular: tachycardic, regular rhythm, heart sounds nl, no murmur appreciated. 2+ radial, PT pulses. All 4 distal extremities are cool to touch (not under blanket). No peripheral edema. Respiratory: CTAB, normal effort Abdomen: slightly distended, diffusely tender with voluntary guarding, bowel sounds hypoactive MSK: no digital clubbing. Tone decreased throughout.  Skin: multiple ecchymoses bilateral lower legs, chronic venous stasis changes noted with glistened skin Neuro: sleeping but does  awaken and respond to some questions, will follow some commands. Able to move all 4 distal limbs.  Labs and Imaging: CBC BMET   Recent Labs Lab 10/27/2014 1430  WBC 17.1*  HGB  16.8*  HCT 51.7*  PLT 313    Recent Labs Lab 10/09/2014 1430  NA 143  K 4.3  CL 107  CO2 20*  BUN 38*  CREATININE 1.37*  GLUCOSE 209*  CALCIUM 9.7     Dg Chest Portable 1 View  10/10/2014   CLINICAL DATA:  Abdominal pain, headache.  EXAM: PORTABLE CHEST - 1 VIEW  COMPARISON:  05/02/2014  FINDINGS: There are low lung volumes. There is no focal parenchymal opacity. There is no pleural effusion or pneumothorax. The heart and mediastinal contours are unremarkable.  There is a dextroscoliosis of the thoracic spine. There is severe osteoarthritis of the left glenohumeral joint with superior migration of the humeral head consistent with chronic rotator cuff tear. There is superior migration of the right humeral head consistent with chronic rotator cuff tear.  IMPRESSION: No active disease.   Electronically Signed   By: Kathreen Devoid   On: 10/29/2014 15:30   Dg Abd Portable 1v  10/03/2014   CLINICAL DATA:  Abdominal pain, right lower quadrant pain on palpation.  EXAM: PORTABLE ABDOMEN - 1 VIEW  COMPARISON:  10/26/2011  FINDINGS: There are dilated small bowel loops. Gaseous distention of the stomach. Findings concerning for small bowel obstruction. No free air. No organomegaly. Heavily calcified splenic artery in the left upper quadrant. Calcified phleboliths in the left pelvis. Severe leftward lumbar scoliosis with advanced degenerative changes.  IMPRESSION: Small bowel dilatation with gaseous distention of the stomach. Findings concerning for small bowel obstruction.   Electronically Signed   By: Rolm Baptise M.D.   On: 10/22/2014 15:31     Leone Brand, MD 10/29/2014, 3:30 PM PGY-2, [place] Intern pager: [phone], text pages welcome

## 2014-10-15 NOTE — Progress Notes (Addendum)
09/30/2014 patient blood pressure was retaken after bolus was given and it was in the systolic blood pressure was in the 80's after report was given to [employer] banker, corporate) on third shift. Family medicine was called again and another 500 cc bolus was given.  Md was also made aware of NG tube and a KUB was done and RN on first shift was notified by the Radiologist the tube was coil in patient esophagus and it needed to be pull back 40 cm or 50 cm. Rn reported to [employer] banker, corporate)  What was said by the radiologist. Rico Sheehan (RN)

## 2014-10-15 NOTE — Consult Note (Signed)
[facility] Surgery Consult Note  Kemoni Quesenberry January 17, 1928  [id number].    Requesting MD: Dr. Colin Rhein Chief Complaint/Reason for Consult: SBO  HPI:  81 y/o white female with PMH of dementia, alcohol abuse, SDH, recurrent falls, stroke presents to [facility] with abdominal pain and N/V from [place] facility. She c/o diffuse severe abdominal pain most severe in the RUQ since 3am this morning which woke her from sleep.  Pain is intermittently sharp and waxes and wanes.  It has progressively worsened through the day.  It is associated with slight SOB, nausea, dry heaving, fatigue, anorexia.  She denies vomiting or fever/chills.  She was able to eat some oatmeal this am and tolerating some liquids.  She had a BM this am and has had some flatus today.  No radiating pain, no precipitating or alleviating factors.  She's never had a bowel obstruction in the past.  Surgeries include perforated viscus with bowel resection from foreign body (the corner of a pill pack swallowed with her daily pills) and a hysterectomy.  No other abdominal surgeries.  No h/o hernias.  Daughter is at bedside.    Abdominal xray showed SB dilatation and gaseous distension of the stomach.  WBC is 17.1, Cr. Is 1.37, Glucose 209.  Initial ECG with concern for ST elevation. No chest pain, no dyspnea. Cardiology consulted and Code stemi called and pt taken to cath lab. Family decided to defer cath.  She has been admitted to the family medicine service.  We were asked to consult regarding her SBO.  Family confirms DNR status. The family is unsure at this time whether they would want surgical interventions if her SBO would not improve.    ROS: All systems reviewed and otherwise negative except for as above  Family History  Problem Relation Age of Onset  . Arthritis Mother   . Diabetes Mother   . Hypertension Mother   . Lung cancer Father   . Stroke Father     Past Medical History  Diagnosis Date  . Gout   . INSOMNIA    . ALLERGIC RHINITIS   . ASTHMA   . OSTEOARTHRITIS   . Rheumatoid arthritis(714.0)   . HYPERTENSION   . HYPERLIPIDEMIA   . GERD (gastroesophageal reflux disease)   . Depression   . ALCOHOL ABUSE     hx detox, dry since 2010  . HYPOTHYROIDISM   . URINARY INCONTINENCE   . Dementia   . Recurrent falls   . SDH (subdural hematoma)     a. 2008 in setting of fall.  . Chronic pain   . Perforated small intestine     a. From swallowed foreign body - s/p exlap with small bowel resection 09/2011.  Marland Kitchen Dysphagia, oropharyngeal 12/15/2013  . Stroke, lacunar     Old lacunar infarct left basal ganglia   . Chronic dislocation of right shoulder 05/15/2014  . Cervical spondylosis without myelopathy 05/15/2014    05/02/14 Cervical spine CT: Severe degenerative disc disease is noted at C3-4, C4-5, C5-6 and C6-7. Erosive degenerative changes are again noted involving the odontoid process which are unchanged compared to prior exam. Degenerative changes seen involving the posterior facet joints at multiple levels posteriorly.  . Orthostatic hypotension 07/28/2012  . Trigger finger, acquired 07/12/2012    Left 3rd, symptomatic   . KNEE REPLACEMENT, BILATERAL, HX OF 08/04/2010    Qualifier: Diagnosis of  By: Marca Ancona RMA, Lucy    . History of alcohol abuse 08/04/2010    Qualifier: Diagnosis of  By: Larose Kells    . Asthma 08/11/2010    Qualifier: Diagnosis of  By: Asa Lente MD, Jannifer Rodney Allergic rhinitis 08/11/2010    Qualifier: Diagnosis of  By: Asa Lente MD, Jannifer Rodney H/O: rheumatic fever     Past Surgical History  Procedure Laterality Date  . Abdominal hysterectomy    . Breast surgery    . Total knee arthroplasty    . Vaginal prolapse repair    . Total knee arthroplasty  07/2002    right  . Total knee arthroplasty  09/1998    Left  . Cataract extraction  2001  . L-spine  1998  . Colpocleisis vaginal [place] fort  06/2010  . Laparotomy  10/26/2011    Procedure: EXPLORATORY LAPAROTOMY;  Surgeon:  Imogene Burn. Georgette Dover, MD;  Location: [place];  Service: General;  Laterality: N/A;  . Bowel resection  10/26/2011    Procedure: SMALL BOWEL RESECTION;  Surgeon: Imogene Burn. Georgette Dover, MD;  Location: [place];  Service: General;  Laterality: N/A;  . Carpal tunnel release   before 2008  . Lumbar laminectomy  2007    Dr Joya Salm    Social History:  reports that she has never smoked. She does not have any smokeless tobacco history on file. She reports that she does not drink alcohol or use illicit drugs.  Allergies:  Allergies  Allergen Reactions  . Enalapril Maleate Anaphylaxis and Swelling    From [facility]  . Azithromycin     From [facility]  . Morphine And Related Other (See Comments)    Possible hallucinations ( 07/12) skin crawling off of body. From [facility]  . Nsaids     Ulcer on EGD 01/01/10 - stopped celebrex From [facility]  . Valacyclovir Hcl     From [facility]  . Allopurinol Rash    From [facility]     (Not in a hospital admission)  Blood pressure 119/72, pulse 106, resp. rate 25, SpO2 95 %. Physical Exam: General: pleasant, WD/WN white female who is laying in bed in NAD HEENT: head is normocephalic, atraumatic.  Sclera are noninjected.  PERRL.  Ears and nose without any masses or lesions.  Mouth is pink and moist Heart: tachycardic, abnormal rhythm.  No obvious murmurs, gallops, or rubs noted.  Palpable pedal pulses bilaterally Lungs: CTAB, no wheezes, rhonchi, or rales noted.  Respiratory effort nonlabored Abd: soft, distended, tender diffusely but most significantly in the RUQ, no rebound or guarding, +BS, no masses, hernias, or organomegaly, midline surgical scar noted and well healed, I do not appreciate a hysterectomy scar. MS: all 4 extremities are symmetrical with no cyanosis, clubbing, or edema. Skin: warm and dry with no masses, lesions, or rashes Psych: Alert, but very sleepy, oriented to date, name and place, with an appropriate affect.   Results for orders placed or performed during the hospital encounter of  10/24/2014 (from the past 48 hour(s))  CBC with Differential     Status: Abnormal   Collection Time: 10/03/2014  2:30 PM  Result Value Ref Range   WBC 17.1 (H) 4.0 - 10.5 K/uL   RBC 5.20 (H) 3.87 - 5.11 MIL/uL   Hemoglobin 16.8 (H) 12.0 - 15.0 g/dL   HCT 51.7 (H) 36.0 - 46.0 %   MCV 99.4 78.0 - 100.0 fL   MCH 32.3 26.0 - 34.0 pg   MCHC 32.5 30.0 - 36.0 g/dL   RDW 12.6 11.5 - 15.5 %   Platelets 313 150 - 400  K/uL   Neutrophils Relative % 80 (H) 43 - 77 %   Neutro Abs 13.5 (H) 1.7 - 7.7 K/uL   Lymphocytes Relative 13 12 - 46 %   Lymphs Abs 2.3 0.7 - 4.0 K/uL   Monocytes Relative 7 3 - 12 %   Monocytes Absolute 1.2 (H) 0.1 - 1.0 K/uL   Eosinophils Relative 0 0 - 5 %   Eosinophils Absolute 0.0 0.0 - 0.7 K/uL   Basophils Relative 0 0 - 1 %   Basophils Absolute 0.0 0.0 - 0.1 K/uL  Basic metabolic panel     Status: Abnormal   Collection Time: 10/08/2014  2:30 PM  Result Value Ref Range   Sodium 143 135 - 145 mmol/L   Potassium 4.3 3.5 - 5.1 mmol/L   Chloride 107 101 - 111 mmol/L   CO2 20 (L) 22 - 32 mmol/L   Glucose, Bld 209 (H) 65 - 99 mg/dL   BUN 38 (H) 6 - 20 mg/dL   Creatinine, Ser 1.37 (H) 0.44 - 1.00 mg/dL   Calcium 9.7 8.9 - 10.3 mg/dL   GFR calc non Af Amer 34 (L) >60 mL/min   GFR calc Af Amer 40 (L) >60 mL/min    Comment: (NOTE) The eGFR has been calculated using the CKD EPI equation. This calculation has not been validated in all clinical situations. eGFR's persistently <60 mL/min signify possible Chronic Kidney Disease.    Anion gap 16 (H) 5 - 15  I-stat troponin, ED     Status: None   Collection Time: 10/10/2014  2:34 PM  Result Value Ref Range   Troponin i, poc 0.01 0.00 - 0.08 ng/mL   Comment 3            Comment: Due to the release kinetics of cTnI, a negative result within the first hours of the onset of symptoms does not rule out myocardial infarction with certainty. If myocardial infarction is still suspected, repeat the test at appropriate intervals.    Dg  Chest Portable 1 View  10/01/2014   CLINICAL DATA:  Abdominal pain, headache.  EXAM: PORTABLE CHEST - 1 VIEW  COMPARISON:  05/02/2014  FINDINGS: There are low lung volumes. There is no focal parenchymal opacity. There is no pleural effusion or pneumothorax. The heart and mediastinal contours are unremarkable.  There is a dextroscoliosis of the thoracic spine. There is severe osteoarthritis of the left glenohumeral joint with superior migration of the humeral head consistent with chronic rotator cuff tear. There is superior migration of the right humeral head consistent with chronic rotator cuff tear.  IMPRESSION: No active disease.   Electronically Signed   By: Kathreen Devoid   On: 10/10/2014 15:30   Dg Abd Portable 1v  10/12/2014   CLINICAL DATA:  Abdominal pain, right lower quadrant pain on palpation.  EXAM: PORTABLE ABDOMEN - 1 VIEW  COMPARISON:  10/26/2011  FINDINGS: There are dilated small bowel loops. Gaseous distention of the stomach. Findings concerning for small bowel obstruction. No free air. No organomegaly. Heavily calcified splenic artery in the left upper quadrant. Calcified phleboliths in the left pelvis. Severe leftward lumbar scoliosis with advanced degenerative changes.  IMPRESSION: Small bowel dilatation with gaseous distention of the stomach. Findings concerning for small bowel obstruction.   Electronically Signed   By: Rolm Baptise M.D.   On: 10/22/2014 15:31      Assessment/Plan SBO, likely adhesions secondary to previous ex lap -Admit to [facility] medicine service, cardiology following, we will consult -  Obtain CT scan to better characterize the cause of the SBO, likely adhesive.  The family is unsure whether they will want to proceed with surgical interventions if she does not improve or acutely worsens -NPO, NG tube, bowel rest, IVF, pain control, antiemetics -SCD's -PT/OT to aid in recovery -Repeat abdominal xray and labs in am  Ex lap with SBR from foreign body (plastic pill  pack piece) - Dr. Georgette Dover 10/26/2011 Anterior STEMI - deferred Cath by family Multiple medical problems    Coralie Keens, [facility] Surgery 10/11/2014, 4:28 PM Pager: [phone]

## 2014-10-15 NOTE — ED Notes (Signed)
Per EMS- pt from [facility]. Pt woke up "not feeling well" complaint of abd pain and headache. RLQ, tender on palpation. BP 105/85. HR 110.

## 2014-10-15 NOTE — Progress Notes (Signed)
FPTS Interim Progress Note  S: Pt sleeping, not easily arousable  O: BP 81/53 mmHg  Pulse 114  Temp(Src) 97.1 F (36.2 C) (Axillary)  Resp 26  Ht 5\' 3"  (1.6 m)  Wt 105 lb 6.1 oz (47.8 kg)  BMI 18.67 kg/m2  SpO2 95%  General: NAD, sleeping upright Abdomen: distended, tender diffusely with some visible wincing, bowel sounds hypoactive/minimal  A/P: 81 y.o. female with SBO, initially suspected STEMI, hypotension.   Current main issue is patient's low BP. She was dehydrated on presentation and is in the process of getting her 3rd 1L bolus, and 4th liter ordered. Was noted and confirmed that pt was on prednisone chronically 5mg , wonder if she may be not having a good adrenal response so will trial 60mg  IV solumedrol. CT abdomen ordered to further characterize her intraabdominal process, as her presentation may be more than just SBO (concern for mesenteric ischemia), but low BP has prevented her from going down to radiology thus far. Nurse did report some dark appearing fluid in diaper when changing, no frank red blood.  NG tube has been removed as KUB showed multiple coils; family decision made that if NG tube was going to be placed again to be done under fluoro guidance which they were informed would not be done until the morning at the earliest.  Repeat troponin only minimally elevated at 0.04, so suspect earlier EKG may have been artifact, will continue to trend troponin.   Updated family at the bedside, will continue with fluid resuscitation, IV solumedrol, and family is in agreement that if needed would like to pursue pressor support.  Leone Brand, MD 10/20/2014, 11:09 PM PGY-2, [facility] pager [phone]

## 2014-10-15 NOTE — Progress Notes (Signed)
Subjective:    Patient ID: Connie Fitzpatrick, female    DOB: 04-24-1928, 81 y.o.   MRN: [id number]  HPI  Patient is a [facility] home resident. Received a call on the teres major this morning for an acute episode of unresponsiveness.  Patient has a past medical history of rheumatoid arthritis, chronic steroid use, depression, GERD, asthma, gout, osteoarthritis, hypertension, hyperlipidemia, urinary incontinence, frequent falls, hypothyroidism, small bowel resection and weight loss.  Nursing home staff states that patient had a 5 second episode this morning and which she was unresponsive, eyes open and fixed and drooling. Staff states that the patient was on the commode having a bowel movement at the time of this episode. The staff felt that the pts lower extremities were cold and they could not find a pulse bilaterally. Her vital signs at that time were stable, a CBG was not collected. Reportedly, stool was of normal consistency, nonbloody. Patient had been complaining of stomach pain last night since 3 am, but has been having regular bowel movements and present but decreased appetite. In transferring patient from commode to floor and bed, patient did sustain some skin tears on her right lower leg. Patient is now sitting up in bed, alert and oriented eating her breakfast. She states she recalls having cramping, stomach pains that started at 3 AM last night, she called the nurse this morning to be placed on the commode to have a bowel movement. She does not recall the actual bowel movement. She reports continued cramping sensation in her abdomen, pointing to epigastric region.  Past Medical History  Diagnosis Date  . Gout   . INSOMNIA   . ALLERGIC RHINITIS   . ASTHMA   . OSTEOARTHRITIS   . Rheumatoid arthritis(714.0)   . HYPERTENSION   . HYPERLIPIDEMIA   . GERD (gastroesophageal reflux disease)   . Depression   . ALCOHOL ABUSE     hx detox, dry since 2010  . HYPOTHYROIDISM   . URINARY  INCONTINENCE   . Dementia   . Recurrent falls   . SDH (subdural hematoma)     a. 2008 in setting of fall.  . Chronic pain   . Perforated small intestine     a. From swallowed foreign body - s/p exlap with small bowel resection 09/2011.  Marland Kitchen Dysphagia, oropharyngeal 12/15/2013  . Stroke, lacunar     Old lacunar infarct left basal ganglia   . Chronic dislocation of right shoulder 05/15/2014  . Cervical spondylosis without myelopathy 05/15/2014    05/02/14 Cervical spine CT: Severe degenerative disc disease is noted at C3-4, C4-5, C5-6 and C6-7. Erosive degenerative changes are again noted involving the odontoid process which are unchanged compared to prior exam. Degenerative changes seen involving the posterior facet joints at multiple levels posteriorly.  . Orthostatic hypotension 07/28/2012  . Trigger finger, acquired 07/12/2012    Left 3rd, symptomatic   . KNEE REPLACEMENT, BILATERAL, HX OF 08/04/2010    Qualifier: Diagnosis of  By: Marca Ancona RMA, Lucy    . History of alcohol abuse 08/04/2010    Qualifier: Diagnosis of  By: Marca Ancona RMA, Lucy    . Asthma 08/11/2010    Qualifier: Diagnosis of  By: Asa Lente MD, Jannifer Rodney Allergic rhinitis 08/11/2010    Qualifier: Diagnosis of  By: Asa Lente MD, Jannifer Rodney H/O: rheumatic fever    Allergies  Allergen Reactions  . Enalapril Maleate Anaphylaxis and Swelling    From [facility]  . Azithromycin  From [facility]  . Morphine And Related Other (See Comments)    Possible hallucinations ( 07/12) skin crawling off of body. From [facility]  . Nsaids     Ulcer on EGD 01/01/10 - stopped celebrex From [facility]  . Valacyclovir Hcl     From [facility]  . Allopurinol Rash    From [facility]   Past Surgical History  Procedure Laterality Date  . Abdominal hysterectomy    . Breast surgery    . Total knee arthroplasty    . Vaginal prolapse repair    . Total knee arthroplasty  07/2002    right  . Total knee arthroplasty  09/1998    Left  . Cataract extraction  2001  . L-spine  1998  .  Colpocleisis vaginal [place]  06/2010  . Laparotomy  10/26/2011    Procedure: EXPLORATORY LAPAROTOMY;  Surgeon: Imogene Burn. Georgette Dover, MD;  Location: [place];  Service: General;  Laterality: N/A;  . Bowel resection  10/26/2011    Procedure: SMALL BOWEL RESECTION;  Surgeon: Imogene Burn. Georgette Dover, MD;  Location: [place];  Service: General;  Laterality: N/A;  . Carpal tunnel release   before 2008  . Lumbar laminectomy  2007    Dr Joya Salm    Review of Systems Per history of present illness    Objective:   Physical Exam BP 154/81 mmHg  Pulse 91  Temp(Src) 97 F (36.1 C)  Resp 20  SpO2 94% Gen: NAD. Nontoxic in appearance, well-developed, well-nourished, elderly Caucasian female HEENT: AT. [place]. Bilateral eyes without injections or icterus. MMM.  CV: RRR no murmur appreciated Chest: CTAB, no wheeze or crackles Abd: Soft. Flat, adult diaper in place, tenderness to epigastric, and mild tenderness right lower quadrant . Mildly distended. BS hypoactive. No Masses palpated.  Ext: No erythema. No edema. WWP. Thin skin, multiple bruises and small lacerations. 2 dressings applied to right posterior calf, clean dry and intact. +2/4 PT bilaterally. Neuro: EOMi. Alert. Oriented, no gross focal deficits. Psych: Normal affect. Normal demeanor. Normal thought content.    Assessment & Plan:  Connie Fitzpatrick is a 81 y.o. afebrile female with acute onset epigastric pain, cramping and unresponsive event while moving her bowels last 5s.  - Unresponsive event (5s): likely vasovagal, patient has returned to her baseline and is attempting to eat breakfast this morning. - Abdominal pain: Exam today with mild to moderate abdominal discomfort, mostly epigastric region but also including right lower quadrant. Patient appears mildly distended. Patient has undergone a small bowel resection in 2013 secondary to perforation. Patient is afebrile, and attempting to eat her breakfast, which she states she's had decreased appetite this morning,  but was able to eat her oatmeal.       - Ordered DG ABD xray, to be completed this morning.       - Updated husband on event and plan. Will continue to update him as we get results.        - XRAY with SBO. Pt made NPO, CMET and CBC ordered.  - Skin laceration: 2 small skin breaks secondary to event. Area are well dressed.  Howard Pouch DO PGY3 [facility]

## 2014-10-15 NOTE — Consult Note (Signed)
Reason for Consult: STEMI  Referring Physician:   Miho Monda is an 81 y.o. female.  HPI:   The patient is an 81 yo pleasantly demented female with a history of CVA, subdural hematoma, asthma, OA, HTN, HLD, GERD, ETOH abuse, hypothyroidism.  She presents with abdominal pain and possible SBO.  Her EKG was concerning for STEMI.  The patient denies chest and only reports abd pain.  No vomiting,  LEE, SOB, orthopnea. + nausea.  She had syncope/presyncope will on the toilet.  Initial troponin is negative.    Past Medical History  Diagnosis Date  . Gout   . INSOMNIA   . ALLERGIC RHINITIS   . ASTHMA   . OSTEOARTHRITIS   . Rheumatoid arthritis(714.0)   . HYPERTENSION   . HYPERLIPIDEMIA   . GERD (gastroesophageal reflux disease)   . Depression   . ALCOHOL ABUSE     hx detox, dry since 2010  . HYPOTHYROIDISM   . URINARY INCONTINENCE   . Dementia   . Recurrent falls   . SDH (subdural hematoma)     a. 2008 in setting of fall.  . Chronic pain   . Perforated small intestine     a. From swallowed foreign body - s/p exlap with small bowel resection 09/2011.  Marland Kitchen Dysphagia, oropharyngeal 12/15/2013  . Stroke, lacunar     Old lacunar infarct left basal ganglia   . Chronic dislocation of right shoulder 05/15/2014  . Cervical spondylosis without myelopathy 05/15/2014    05/02/14 Cervical spine CT: Severe degenerative disc disease is noted at C3-4, C4-5, C5-6 and C6-7. Erosive degenerative changes are again noted involving the odontoid process which are unchanged compared to prior exam. Degenerative changes seen involving the posterior facet joints at multiple levels posteriorly.  . Orthostatic hypotension 07/28/2012  . Trigger finger, acquired 07/12/2012    Left 3rd, symptomatic   . KNEE REPLACEMENT, BILATERAL, HX OF 08/04/2010    Qualifier: Diagnosis of  By: Marca Ancona RMA, Lucy    . History of alcohol abuse 08/04/2010    Qualifier: Diagnosis of  By: Marca Ancona RMA, Lucy    . Asthma 08/11/2010   Qualifier: Diagnosis of  By: Asa Lente MD, Jannifer Rodney Allergic rhinitis 08/11/2010    Qualifier: Diagnosis of  By: Asa Lente MD, Jannifer Rodney H/O: rheumatic fever     Past Surgical History  Procedure Laterality Date  . Abdominal hysterectomy    . Breast surgery    . Total knee arthroplasty    . Vaginal prolapse repair    . Total knee arthroplasty  07/2002    right  . Total knee arthroplasty  09/1998    Left  . Cataract extraction  2001  . L-spine  1998  . Colpocleisis vaginal [place]  06/2010  . Laparotomy  10/26/2011    Procedure: EXPLORATORY LAPAROTOMY;  Surgeon: Imogene Burn. Georgette Dover, MD;  Location: [place];  Service: General;  Laterality: N/A;  . Bowel resection  10/26/2011    Procedure: SMALL BOWEL RESECTION;  Surgeon: Imogene Burn. Georgette Dover, MD;  Location: [place];  Service: General;  Laterality: N/A;  . Carpal tunnel release   before 2008  . Lumbar laminectomy  2007    Dr Joya Salm    Family History  Problem Relation Age of Onset  . Arthritis Mother   . Diabetes Mother   . Hypertension Mother   . Lung cancer Father   . Stroke Father     Social History:  reports that she has never smoked. She does not have any smokeless tobacco history on file. She reports that she does not drink alcohol or use illicit drugs.  Allergies:  Allergies  Allergen Reactions  . Enalapril Maleate Anaphylaxis and Swelling    From [facility]  . Azithromycin     From [facility]  . Morphine And Related Other (See Comments)    Possible hallucinations ( 07/12) skin crawling off of body. From [facility]  . Nsaids     Ulcer on EGD 01/01/10 - stopped celebrex From [facility]  . Valacyclovir Hcl     From [facility]  . Allopurinol Rash    From [facility]    Medications: Medication Sig  acetaminophen (TYLENOL) 650 MG CR tablet Take 650 mg by mouth 2 (two) times daily.   albuterol (PROVENTIL HFA;VENTOLIN HFA) 108 (90 BASE) MCG/ACT inhaler Inhale 2 puffs into the lungs every 4 (four) hours as needed. For shortness of breath  Amino Acids-Protein  Hydrolys (FEEDING SUPPLEMENT, PRO-STAT SUGAR FREE 64,) LIQD Take 30 mLs by mouth daily.  bisacodyl (DULCOLAX) 5 MG EC tablet Take 5 mg by mouth daily as needed (for constipation).   calcium citrate (CALCITRATE - DOSED IN MG ELEMENTAL CALCIUM) 950 MG tablet Take 400 mg of elemental calcium by mouth daily.  donepezil (ARICEPT) 10 MG tablet Take 10 mg by mouth daily with breakfast.  DULoxetine (CYMBALTA) 30 MG capsule Take 30 mg by mouth daily at 8 pm.   ENSURE (ENSURE) Take 237 mLs by mouth daily before lunch.   febuxostat (ULORIC) 40 MG tablet Take 40 mg by mouth daily.  gabapentin (NEURONTIN) 300 MG capsule Take 300 mg by mouth 2 (two) times daily.  levothyroxine (SYNTHROID, LEVOTHROID) 75 MCG tablet Take 1 tablet (75 mcg total) by mouth daily with breakfast. Patient taking differently: Take 75 mcg by mouth daily. At 4pm  loratadine (CLARITIN) 10 MG tablet Take 10 mg by mouth daily.  memantine (NAMENDA) 10 MG tablet Take 10 mg by mouth 2 (two) times daily.  Multiple Vitamin (MULTIVITAMIN WITH MINERALS) TABS tablet Take 1 tablet by mouth daily at 8 pm.  multivitamin-lutein (OCUVITE-LUTEIN) CAPS Take 1 capsule by mouth daily with breakfast.  predniSONE (DELTASONE) 5 MG tablet Take 5 mg by mouth daily.  Vitamin D, Ergocalciferol, (DRISDOL) 50000 UNITS CAPS Take 50,000 Units by mouth every 30 (thirty) days.     Results for orders placed or performed during the hospital encounter of 10/23/2014 (from the past 48 hour(s))  CBC with Differential     Status: Abnormal   Collection Time: 10/06/2014  2:30 PM  Result Value Ref Range   WBC 17.1 (H) 4.0 - 10.5 K/uL   RBC 5.20 (H) 3.87 - 5.11 MIL/uL   Hemoglobin 16.8 (H) 12.0 - 15.0 g/dL   HCT 51.7 (H) 36.0 - 46.0 %   MCV 99.4 78.0 - 100.0 fL   MCH 32.3 26.0 - 34.0 pg   MCHC 32.5 30.0 - 36.0 g/dL   RDW 12.6 11.5 - 15.5 %   Platelets 313 150 - 400 K/uL   Neutrophils Relative % 80 (H) 43 - 77 %   Neutro Abs 13.5 (H) 1.7 - 7.7 K/uL   Lymphocytes Relative  13 12 - 46 %   Lymphs Abs 2.3 0.7 - 4.0 K/uL   Monocytes Relative 7 3 - 12 %   Monocytes Absolute 1.2 (H) 0.1 - 1.0 K/uL   Eosinophils Relative 0 0 - 5 %   Eosinophils Absolute 0.0 0.0 - 0.7 K/uL  Basophils Relative 0 0 - 1 %   Basophils Absolute 0.0 0.0 - 0.1 K/uL  I-stat troponin, ED     Status: None   Collection Time: 10/18/2014  2:34 PM  Result Value Ref Range   Troponin i, poc 0.01 0.00 - 0.08 ng/mL   Comment 3            Comment: Due to the release kinetics of cTnI, a negative result within the first hours of the onset of symptoms does not rule out myocardial infarction with certainty. If myocardial infarction is still suspected, repeat the test at appropriate intervals.     No results found.  Review of Systems  Constitutional: Negative for fever.  Cardiovascular: Negative for chest pain, orthopnea and leg swelling.  Gastrointestinal: Positive for nausea and abdominal pain. Negative for vomiting.  Neurological: Positive for weakness.   Blood pressure 85/54, resp. rate 21, SpO2 99 %. Physical Exam  Nursing note and vitals reviewed. Constitutional: She appears well-developed. No distress.  Very thin and frail appearing   HENT:  Head: Normocephalic and atraumatic.  Eyes: EOM are normal. Pupils are equal, round, and reactive to light.  Neck: Normal range of motion.  Cardiovascular: Normal rate, regular rhythm and normal heart sounds.  Exam reveals no gallop and no friction rub.   No murmur heard. Pulses:      Radial pulses are 1+ on the right side, and 1+ on the left side.  Respiratory: Effort normal and breath sounds normal. She has no wheezes. She has no rales.  GI: Soft. Bowel sounds are normal. She exhibits distension. There is tenderness.  Musculoskeletal: She exhibits no edema.  Neurological: She is alert. She exhibits abnormal muscle tone.  Skin: Skin is warm and dry.  Psychiatric: She has a normal mood and affect.    Assessment/Plan: EKG concerning for  STEMI in this pleasant 81 yo demented female with no CP and initial troponin of 0.01.  Given her age, dementia, comorbidities, and after discussion with the patient's family, it was decided not to take her to the cath lab.  Recommend continuing to cycle troponin.    Tarri Fuller, [place] 10/13/2014, 3:01 PM

## 2014-10-15 NOTE — Progress Notes (Signed)
Chaplain responded to code stemi, later cancelled. Chaplain provided emotional support to pt daughter and offered to keep her in prayers. Chaplain also brought pt daughter cup of water. Page chaplain as needed.    10/08/2014 1500  Clinical Encounter Type  Visited With Family  Visit Type ED;Spiritual support  Spiritual Encounters  Spiritual Needs Emotional;Prayer  Stress Factors  Family Stress Factors Loss of control;Major life changes  Jorden Minchey, Barbette Hair, Chaplain 10/12/2014 3:21 PM

## 2014-10-15 NOTE — Progress Notes (Signed)
10/22/2014 patient transfer from the emergency room to 2central at 1745. She is alert to person, was told she was in the hospital. She is weak when arrive on unit. Patient have a NG tube in right nare. Nurse in ED said it was hook up to suction, but nothing was coming out of tube. When patient arrive on unit RN on 2central was told patient need to get contrast for CT Scan. Nurse on unit was told patient had problem swallowing when  RN arrive on unit. Rn on 2central did call family medicine MD if he wanted patient to receive contrast via tube and he was also told patient was npo and had problem with swallowing. MD said he will check with surgery and nurse was told patient dont need to have the po contrast. Patient when got to the unit it was told she received a  1 Liter NS bolus in the ED. Patient vital was taken and blood pressure was 90/58.  Patient blood pressure was retaken at 1830 blood pressure was in the 70"s to 80"s Rn called Family medicine MD and a 500 cc bolus NS was ordered. She had three skintear on the back of right lower leg. Non adhesive dressing and tegaderm was placed at [place], with the help of [place]. Patient suction was hooked up but patient daughter felt the suction was not working. Hoyle Sauer and [place] came in the room and was working on NG tube and was not able to hear anything. [facility] RN.

## 2014-10-15 NOTE — ED Provider Notes (Signed)
CSN: [id number]     Arrival date & time 10/20/2014  1355 History   First MD Initiated Contact with Patient 10/04/2014 1403     Chief Complaint  Patient presents with  . Abdominal Pain     (Consider location/radiation/quality/duration/timing/severity/associated sxs/prior Treatment) Patient is a 81 y.o. female presenting with abdominal pain.  Abdominal Pain Pain location:  Generalized Pain quality: sharp   Pain radiates to:  Does not radiate Pain severity:  Severe Onset quality:  Gradual Duration:  1 day Timing:  Constant Progression:  Worsening Chronicity:  Recurrent Context comment:  Prior perforated sb Relieved by:  Nothing Worsened by:  Nothing tried Ineffective treatments:  None tried Associated symptoms: anorexia, nausea and vomiting   Associated symptoms: no cough, no diarrhea, no dysuria and no fever     Past Medical History  Diagnosis Date  . Gout   . INSOMNIA   . ALLERGIC RHINITIS   . ASTHMA   . OSTEOARTHRITIS   . Rheumatoid arthritis(714.0)   . HYPERTENSION   . HYPERLIPIDEMIA   . GERD (gastroesophageal reflux disease)   . Depression   . ALCOHOL ABUSE     hx detox, dry since 2010  . HYPOTHYROIDISM   . URINARY INCONTINENCE   . Dementia   . Recurrent falls   . SDH (subdural hematoma)     a. 2008 in setting of fall.  . Chronic pain   . Perforated small intestine     a. From swallowed foreign body - s/p exlap with small bowel resection 09/2011.  Marland Kitchen Dysphagia, oropharyngeal 12/15/2013  . Stroke, lacunar     Old lacunar infarct left basal ganglia   . Chronic dislocation of right shoulder 05/15/2014  . Cervical spondylosis without myelopathy 05/15/2014    05/02/14 Cervical spine CT: Severe degenerative disc disease is noted at C3-4, C4-5, C5-6 and C6-7. Erosive degenerative changes are again noted involving the odontoid process which are unchanged compared to prior exam. Degenerative changes seen involving the posterior facet joints at multiple levels posteriorly.   . Orthostatic hypotension 07/28/2012  . Trigger finger, acquired 07/12/2012    Left 3rd, symptomatic   . KNEE REPLACEMENT, BILATERAL, HX OF 08/04/2010    Qualifier: Diagnosis of  By: Marca Ancona RMA, Lucy    . History of alcohol abuse 08/04/2010    Qualifier: Diagnosis of  By: Marca Ancona RMA, Lucy    . Asthma 08/11/2010    Qualifier: Diagnosis of  By: Asa Lente MD, Jannifer Rodney Allergic rhinitis 08/11/2010    Qualifier: Diagnosis of  By: Asa Lente MD, Jannifer Rodney H/O: rheumatic fever    Past Surgical History  Procedure Laterality Date  . Abdominal hysterectomy    . Breast surgery    . Total knee arthroplasty    . Vaginal prolapse repair    . Total knee arthroplasty  07/2002    right  . Total knee arthroplasty  09/1998    Left  . Cataract extraction  2001  . L-spine  1998  . Colpocleisis vaginal [place]  06/2010  . Laparotomy  10/26/2011    Procedure: EXPLORATORY LAPAROTOMY;  Surgeon: Imogene Burn. Georgette Dover, MD;  Location: [place];  Service: General;  Laterality: N/A;  . Bowel resection  10/26/2011    Procedure: SMALL BOWEL RESECTION;  Surgeon: Imogene Burn. Georgette Dover, MD;  Location: [place];  Service: General;  Laterality: N/A;  . Carpal tunnel release   before 2008  . Lumbar laminectomy  2007    Dr Joya Salm  Family History  Problem Relation Age of Onset  . Arthritis Mother   . Diabetes Mother   . Hypertension Mother   . Lung cancer Father   . Stroke Father    History  Substance Use Topics  . Smoking status: Never Smoker   . Smokeless tobacco: Not on file  . Alcohol Use: No     Comment: Previous hx of alcohol abuse   OB History    No data available     Review of Systems  Constitutional: Negative for fever.  Respiratory: Negative for cough.   Gastrointestinal: Positive for nausea, vomiting, abdominal pain and anorexia. Negative for diarrhea.  Genitourinary: Negative for dysuria.  All other systems reviewed and are negative.     Allergies  Enalapril maleate; Azithromycin; Morphine and related;  Nsaids; Valacyclovir hcl; and Allopurinol  Home Medications   Prior to Admission medications   Medication Sig Start Date End Date Taking? Authorizing Provider  acetaminophen (TYLENOL) 650 MG CR tablet Take 650 mg by mouth 2 (two) times daily.  11/08/13   Josalyn Funches, MD  albuterol (PROVENTIL HFA;VENTOLIN HFA) 108 (90 BASE) MCG/ACT inhaler Inhale 2 puffs into the lungs every 4 (four) hours as needed. For shortness of breath 06/03/12   Candelaria Celeste, MD  Amino Acids-Protein Hydrolys (FEEDING SUPPLEMENT, PRO-STAT SUGAR FREE 64,) LIQD Take 30 mLs by mouth daily.    Historical Provider, MD  bisacodyl (DULCOLAX) 5 MG EC tablet Take 5 mg by mouth daily as needed (for constipation).     Historical Provider, MD  calcium citrate (CALCITRATE - DOSED IN MG ELEMENTAL CALCIUM) 950 MG tablet Take 400 mg of elemental calcium by mouth daily.    Historical Provider, MD  donepezil (ARICEPT) 10 MG tablet Take 10 mg by mouth daily with breakfast.    Historical Provider, MD  DULoxetine (CYMBALTA) 30 MG capsule Take 30 mg by mouth daily at 8 pm.     Historical Provider, MD  ENSURE (ENSURE) Take 237 mLs by mouth daily before lunch.     Historical Provider, MD  febuxostat (ULORIC) 40 MG tablet Take 40 mg by mouth daily.    Historical Provider, MD  gabapentin (NEURONTIN) 300 MG capsule Take 300 mg by mouth 2 (two) times daily.    Historical Provider, MD  levothyroxine (SYNTHROID, LEVOTHROID) 75 MCG tablet Take 1 tablet (75 mcg total) by mouth daily with breakfast. Patient taking differently: Take 75 mcg by mouth daily. At 4pm 08/08/12   Cletus Gash, MD  loratadine (CLARITIN) 10 MG tablet Take 10 mg by mouth daily.    Historical Provider, MD  memantine (NAMENDA) 10 MG tablet Take 10 mg by mouth 2 (two) times daily.    Historical Provider, MD  Multiple Vitamin (MULTIVITAMIN WITH MINERALS) TABS tablet Take 1 tablet by mouth daily at 8 pm.    Historical Provider, MD  multivitamin-lutein (OCUVITE-LUTEIN) CAPS Take 1  capsule by mouth daily with breakfast.    Historical Provider, MD  predniSONE (DELTASONE) 5 MG tablet Take 5 mg by mouth daily. 09/24/11   Rowe Clack, MD  Vitamin D, Ergocalciferol, (DRISDOL) 50000 UNITS CAPS Take 50,000 Units by mouth every 30 (thirty) days.    Historical Provider, MD   BP 119/72 mmHg  Pulse 106  Resp 25  SpO2 95% Physical Exam  Constitutional: She is oriented to person, place, and time. She appears well-developed and well-nourished.  HENT:  Head: Normocephalic and atraumatic.  Right Ear: External ear normal.  Left Ear: External ear normal.  Eyes: Conjunctivae and EOM are normal. Pupils are equal, round, and reactive to light.  Neck: Normal range of motion. Neck supple.  Cardiovascular: Normal rate, regular rhythm, normal heart sounds and intact distal pulses.   Pulmonary/Chest: Effort normal and breath sounds normal.  Abdominal: Soft. Bowel sounds are normal. She exhibits distension. There is generalized tenderness.  Musculoskeletal: Normal range of motion.  Neurological: She is alert and oriented to person, place, and time.  Skin: Skin is warm and dry.  Vitals reviewed.   ED Course  Procedures (including critical care time) Labs Review Labs Reviewed  CBC WITH DIFFERENTIAL/PLATELET - Abnormal; Notable for the following:    WBC 17.1 (*)    RBC 5.20 (*)    Hemoglobin 16.8 (*)    HCT 51.7 (*)    Neutrophils Relative % 80 (*)    Neutro Abs 13.5 (*)    Monocytes Absolute 1.2 (*)    All other components within normal limits  BASIC METABOLIC PANEL - Abnormal; Notable for the following:    CO2 20 (*)    Glucose, Bld 209 (*)    BUN 38 (*)    Creatinine, Ser 1.37 (*)    GFR calc non Af Amer 34 (*)    GFR calc Af Amer 40 (*)    Anion gap 16 (*)    All other components within normal limits  URINALYSIS, ROUTINE W REFLEX MICROSCOPIC  I-STAT TROPOININ, ED    Imaging Review Dg Chest Portable 1 View  10/19/2014   CLINICAL DATA:  Abdominal pain, headache.   EXAM: PORTABLE CHEST - 1 VIEW  COMPARISON:  05/02/2014  FINDINGS: There are low lung volumes. There is no focal parenchymal opacity. There is no pleural effusion or pneumothorax. The heart and mediastinal contours are unremarkable.  There is a dextroscoliosis of the thoracic spine. There is severe osteoarthritis of the left glenohumeral joint with superior migration of the humeral head consistent with chronic rotator cuff tear. There is superior migration of the right humeral head consistent with chronic rotator cuff tear.  IMPRESSION: No active disease.   Electronically Signed   By: Kathreen Devoid   On: 10/24/2014 15:30   Dg Abd Portable 1v  10/28/2014   CLINICAL DATA:  Abdominal pain, right lower quadrant pain on palpation.  EXAM: PORTABLE ABDOMEN - 1 VIEW  COMPARISON:  10/26/2011  FINDINGS: There are dilated small bowel loops. Gaseous distention of the stomach. Findings concerning for small bowel obstruction. No free air. No organomegaly. Heavily calcified splenic artery in the left upper quadrant. Calcified phleboliths in the left pelvis. Severe leftward lumbar scoliosis with advanced degenerative changes.  IMPRESSION: Small bowel dilatation with gaseous distention of the stomach. Findings concerning for small bowel obstruction.   Electronically Signed   By: Rolm Baptise M.D.   On: 10/24/2014 15:31     EKG Interpretation   Date/Time:  Monday Oct 15 2014 14:13:19 EDT Ventricular Rate:  113 PR Interval:  151 QRS Duration: 93 QT Interval:  361 QTC Calculation: 495 R Axis:   16 Text Interpretation:  Sinus tachycardia Biatrial enlargement Inferior  infarct, old Lateral leads are also involved STE in anterolateral leads,  new since prior Confirmed by Debby Freiberg [phone]) on 10/12/2014 2:21:05  PM      MDM   Final diagnoses:  Abdominal pain    80 y.o. female with pertinent PMH of dementia, prior perforated viscus with exlap presents with abd pain, n/v, xr with SBO from nursing facility. On  arrival vitals  and physical exam as above, consistent with likely SBO.  Initial ECG with concern for STE.  No chest pain, no dyspnea, although nausea could be anginal equivalent.  I spoke with cardiology who looked at ECG and suggested calling code STEMI.  Code stemi called and pt taken to cath lab.  With family discussion agreed to defer cath.  She returned.  XR obtained and with SBO. Consulted surgery who will evaluate pt.  Consulted family medicine for admission.  I spoke with family throughout pt care and confirmed DNR status.  I did discuss the gravity of the situation and how mortality was definitely possible in the short run.  They are unsure of whether or not they want pressors at this time.  BP improved with fluids.  Admitted in stable condition    I have reviewed all laboratory and imaging studies if ordered as above  1. Abdominal pain   2.      SBO 3.      STEMI     Debby Freiberg, MD 10/02/2014 [phone]

## 2014-10-15 NOTE — Progress Notes (Signed)
10/02/2014 when patient arrive on unit diaper was removed noted what look like old blood in diaper and when patient peri area was clean. Rn on first shift did let family medicine MD aware at 2140. [facility] RN.

## 2014-10-15 NOTE — ED Notes (Signed)
Phlebotomy at bedside.

## 2014-10-15 NOTE — Progress Notes (Signed)
I have seen and examined this patient. I have reviewed labs and imaging results.  I have discussed with Dr Raoul Pitch.  I agree with their findings and plans as documented in their acute NH visit note for today.  Acute Issues 1. Possible Small Bowel Obstruction - CC: intermittent right lower cramping pain with (+) small, nonbloody formed BM this moring. Onset of Pain at 3 am today. Awoke patient from sleep. No emesis. (+) nausea.  - Associated witnessed very brief defecational / micturitional near-syncopal event without loss of motor tone while on toilet this morning.  - portable 2 View abdomin obtained at [facility] read as mild small bowel dilation without gas or stool distal.  Patient with history of Expl lap with small bowel resection for small bowel perforati0on - Patient appears to feel ill, which is not her baseline.  - I spoke with pt's HCPOA agent, Clint Lipps, her daughter who agreed to transport of patient to [facility] ED for further evaluation and treatment. I also informed patient's husband, Mr Woolf, about our concerns and recommendation for transport to ED for further evaluation and treatment.  - Pt placed on NPO status this morning. Blood lab work at [facility] was cancelled because of transfer to ED.  Consideration of Abdominopelvic CT and possible General Surgical consultation contingent on further evaluation in ED.  - If hospital admission or observation recommended for patient, please contact the [facility] to admit patient.  - Patient is DNR/DNI but acceptable to transfer to hospital for evaluation and treatment of reversible conditions.

## 2014-10-15 NOTE — Progress Notes (Signed)
Code STEMI:   Pt is a 81 yo female pt of a SNF with advanced dementia, DNR who presents to the ED with c/o abd pain. Found to have SBO. EKG with 34mm anterior STEMI. Pt has no chest pain or SOB. Code STEMI called. After my assessment and discussion with her daughter, we cancelled the Code STEMI given her poor functional status, lack of symptoms. Pt returned to the ED. I communicated this with Dr. Colin Rhein.   Valery Amedee 10/09/2014 3:12 PM

## 2014-10-15 NOTE — Progress Notes (Signed)
In room to offer assistance. Primary nurse at bedside with dressing change. This nurse at bedside to check placement of NG tube. No bowels sounds auscultated and verified by second nurse Troyce RN. Family present.

## 2014-10-16 ENCOUNTER — Inpatient Hospital Stay (HOSPITAL_COMMUNITY): Payer: Medicare Other

## 2014-10-16 LAB — CBC
HCT: 43.1 % (ref 36.0–46.0)
Hemoglobin: 13.8 g/dL (ref 12.0–15.0)
MCH: 32.5 pg (ref 26.0–34.0)
MCHC: 32 g/dL (ref 30.0–36.0)
MCV: 101.7 fL — ABNORMAL HIGH (ref 78.0–100.0)
PLATELETS: 237 10*3/uL (ref 150–400)
RBC: 4.24 MIL/uL (ref 3.87–5.11)
RDW: 13.1 % (ref 11.5–15.5)
WBC: 8.9 10*3/uL (ref 4.0–10.5)

## 2014-10-16 LAB — COMPREHENSIVE METABOLIC PANEL
ALBUMIN: 1.7 g/dL — AB (ref 3.5–5.0)
ALK PHOS: 42 U/L (ref 38–126)
ALT: 12 U/L — ABNORMAL LOW (ref 14–54)
AST: 43 U/L — AB (ref 15–41)
Anion gap: 17 — ABNORMAL HIGH (ref 5–15)
BILIRUBIN TOTAL: 0.2 mg/dL — AB (ref 0.3–1.2)
BUN: 46 mg/dL — ABNORMAL HIGH (ref 6–20)
CHLORIDE: 113 mmol/L — AB (ref 101–111)
CO2: 13 mmol/L — ABNORMAL LOW (ref 22–32)
Calcium: 7.8 mg/dL — ABNORMAL LOW (ref 8.9–10.3)
Creatinine, Ser: 1.61 mg/dL — ABNORMAL HIGH (ref 0.44–1.00)
GFR calc Af Amer: 33 mL/min — ABNORMAL LOW (ref >60)
GFR calc non Af Amer: 28 mL/min — ABNORMAL LOW (ref >60)
Glucose, Bld: 99 mg/dL (ref 65–99)
POTASSIUM: 4.6 mmol/L (ref 3.5–5.1)
Sodium: 143 mmol/L (ref 135–145)
Total Protein: 3.3 g/dL — ABNORMAL LOW (ref 6.5–8.1)

## 2014-10-16 LAB — TROPONIN I: Troponin I: 0.12 ng/mL — ABNORMAL HIGH (ref <0.031)

## 2014-10-16 MED ORDER — HYDROMORPHONE HCL 1 MG/ML IJ SOLN
0.5000 mg | INTRAMUSCULAR | Status: DC | PRN
  Administered 2014-10-16: 0.5 mg via INTRAVENOUS
  Filled 2014-10-16: qty 1

## 2014-10-16 MED ORDER — CHLORHEXIDINE GLUCONATE 0.12 % MT SOLN
15.0000 mL | Freq: Two times a day (BID) | OROMUCOSAL | Status: DC
  Filled 2014-10-16 (×3): qty 15

## 2014-10-16 MED ORDER — CETYLPYRIDINIUM CHLORIDE 0.05 % MT LIQD: 7.0000 mL | Freq: Two times a day (BID) | OROMUCOSAL | Status: DC

## 2014-10-31 NOTE — Progress Notes (Signed)
Patient HR in the 30's ; O2 Sats in the 60's. Patient is a DNR. MD made aware. Teresita Madura RN

## 2014-10-31 NOTE — Progress Notes (Signed)
I received a call from Dr. Lamar Benes in regards to findings and the patient's most recent CT scan for a small bowel obstruction. According to CT scan results revealed findings of ischemic bowel with diffuse gastric pneumatosis, ischemic appearing small bowel loops in the pelvis with hypoperfusion, free air in the upper abdomen, mesenteric and portal venous gas.  According to the patient's chart the patient was having his difficulties maintaining her blood pressure requiring IV fluid boluses.  The patient's daughter and daughter-in-law were in the room and my exam. The patient's abdomen was distended the patient was taken to palpation times all 4 quadrants. There was some guarding.  I discussed with the patient's family the results of the CT scan likely there was  Ischemic bowel that could be secondary to adhesions or a closed loop obstruction. I discussed with him that the only possibility of reversing this would be an operation with small bowel resection. Secondary to the patient's comorbidities I discussed with her there is a very good likelihood that the patient could potentially not survive an operation, and/or would require intensive care treatment postoperatively to require medications to keep her blood pressure up her heart rate going, and likely intubation.  According to the patient's daughter the patient would not want to be intubated. She does not feel the patient would want an operation at this point, and deferred an operation. I discussed that I would speak with Dr. Lamar Benes in regards to comfort care measures at this time.  I have since discussed with Dr. Lamar Benes our conversation and the patient's family's wishes not to proceed with surgery.

## 2014-10-31 NOTE — Progress Notes (Signed)
MD notified of patient time of death. Family at bedside. No pulse verified by two nurses. Patients cardiac telemetry strips placed in chart. Teresita Madura RN

## 2014-10-31 NOTE — Discharge Summary (Signed)
[facility] Death Summary  Patient name: Tove Wideman Medical record number: [id number] Date of birth: 12/26/1927 Age: 81 y.o. Gender: female Date of Admission: Oct 23, 2014  Date of Death: 10/24/14 Admitting Physician: Lupita Dawn, MD  Primary Care Provider: Kenn File, MD Consultants: General surgery  Indication for Hospitalization: abdominal pain  Cause of Death: Severe ischemic small bowel secondary to; Small bowel obstruction  Secondary Diagnoses:  Hx of small intestine perforation and abdominal surgery Dementia Severe dehydration Hypothyroidism Asthma GERD Rheumatoid arthritis  Brief Hospital Course:  Hazelene Doten was a 81 y.o. female admitted from [facility] SNF for abdominal pain. Had KUB showing likely small bowel obstruction before being transferred to the ED. Upon presentation to the ED she had an EKG showing ST elevations in anterolateral leads for which code STEMI was called, brought to cath lab but procedure was canceled after cardiology discussion with the family regarding current health status. Initial troponin was negative. She had a repeat KUB which again showed a SBO, also while in the ED was intermittently hypotensive. Lab workup was significant for leukocytosis of 17.1 and Hgb of 16.8 which correlated with her clinical appearance of being severely dehydrated. Antibiotics were not started because leukocytosis was deemed likely from hemoconcentration, was afebrile and no other evidence of infection. General surgery was consulted in the ED and agreed with conservative management, bowel rest, IV rehydration, NG tube, and obtain CT abdomen. NG tube was placed after several attempts in the ED but did not return any fluid (subsequent KUB showed the tube was coiled and therefore removed and not replaced). She received 1L bolus before being brought up to the stepdown unit. While in SDU she continued to be hypotensive and received multiple fluid  boluses (4L total) and IV burst steroids. CT scan was delayed due to hypotension but was eventually obtained and showed ischemic bowel, diffuse gastric pneumatosis (full read below) with abdominal free air, mesenteric and portal venous gas. General surgery was again called and after discussion with the family it was decided not to bring her to the OR for surgical resection. She was started on IV dilaudid for pain management (family reported prior bad reaction with morphine and did not want to use this) and passed away several hours later with family at the bedside.  Significant Procedures: none  Significant Labs and Imaging:   Recent Labs Lab 10/23/14 1430 10-24-14 0005  WBC 17.1* 8.9  HGB 16.8* 13.8  HCT 51.7* 43.1  PLT 313 237    Recent Labs Lab 2014-10-23 1430 10/24/2014 0005  NA 143 143  K 4.3 4.6  CL 107 113*  CO2 20* 13*  GLUCOSE 209* 99  BUN 38* 46*  CREATININE 1.37* 1.61*  CALCIUM 9.7 7.8*  ALKPHOS  --  42  AST  --  43*  ALT  --  12*  ALBUMIN  --  1.7*   Ct Abdomen Pelvis W Contrast  Oct 23, 2014   CLINICAL DATA:  81 year old female small bowel obstruction. Multiple abdominal surgeries.  EXAM: CT ABDOMEN AND PELVIS WITH CONTRAST  TECHNIQUE: Multidetector CT imaging of the abdomen and pelvis was performed using the standard protocol following bolus administration of intravenous contrast.  CONTRAST:  44mL OMNIPAQUE IOHEXOL 300 MG/ML  SOLN  COMPARISON:  Radiographs earlier this day, most recent CT 10/26/2011  FINDINGS: There is retrocrural air adjacent to the distal esophagus. Heart is enlarged with dense coronary artery calcifications. Dependent atelectasis noted.  The stomach is distended with fluid with diffuse gastric pneumatosis.  There is diffuse portal venous gas throughout the left hepatic lobe and mesenteric vein. Mesenteric gas branches in the lower abdomen. Multiple small foci of free air in the left upper quadrant of the abdomen adjacent to the greater curvature of  stomach.  Stomach is markedly distended with fluid. The duodenum is fluid-filled and relatively decompressed. There is diffuse small bowel dilatation most significant distally. Edematous appearing small bowel loops in the pelvis, mesenteric edema and hypoenhancement of small bowel wall in the midline lower pelvis. Moderate volume of free fluid tracks distally in the pelvis, some of which is complex. Colon is relatively decompressed with multiple colonic diverticula in the sigmoid colon.  The spleen is small in appearance and heterogeneous with probable hypoperfusion and splenic infarct. Perisplenic fluid is seen in the left upper quadrant and tracking in the pericolic gutter.  Gallbladder and adrenal glands are normal. Pancreatic atrophy without ductal dilatation. There is symmetric renal enhancement. Diminished renal excretion on delayed phase imaging, suggest renal dysfunction.  The abdominal aorta is tortuous, normal in caliber. There is variant anatomy from the upper abdominal aorta with left gastric artery apparently arising directly from the aorta. Not a dedicated angiographic evaluation, however the celiac artery, superior mesenteric artery, and proximal inferior mesenteric artery are opacified with contrast, no abrupt occlusion noted.  Urinary bladder is decompressed, but thick walled. The rectum appears patulous, there is pelvic floor descent.  Scoliotic curvature in severe degenerative change in the lumbar spine.  IMPRESSION: 1. Findings of ischemic bowel with diffuse gastric pneumatosis, ischemic appearing small bowel loops in the pelvis with hypoperfusion, free air in the upper abdomen, mesenteric and portal venous gas. 2. Findings was consistent with splenic infarct with free fluid in the left upper quadrant and tracking in the pericolic gutter. 3. Thick-walled decompressed urinary bladder, correlation for urinary tract infection recommended. 4. Additional chronic findings as described.  Critical  Value/emergent results were called by telephone at the time of interpretation on 10/30/2014 at 11:47 pm to Dr. Lamar Benes , who verbally acknowledged these results.   Electronically Signed   By: Jeb Levering M.D.   On: 10/18/2014 23:53   Dg Chest Portable 1 View  10/27/2014   CLINICAL DATA:  Abdominal pain, headache.  EXAM: PORTABLE CHEST - 1 VIEW  COMPARISON:  05/02/2014  FINDINGS: There are low lung volumes. There is no focal parenchymal opacity. There is no pleural effusion or pneumothorax. The heart and mediastinal contours are unremarkable.  There is a dextroscoliosis of the thoracic spine. There is severe osteoarthritis of the left glenohumeral joint with superior migration of the humeral head consistent with chronic rotator cuff tear. There is superior migration of the right humeral head consistent with chronic rotator cuff tear.  IMPRESSION: No active disease.   Electronically Signed   By: Kathreen Devoid   On: 10/04/2014 15:30   Dg Abd Portable 1v  10/06/2014   CLINICAL DATA:  Abdominal pain, right lower quadrant pain on palpation.  EXAM: PORTABLE ABDOMEN - 1 VIEW  COMPARISON:  10/26/2011  FINDINGS: There are dilated small bowel loops. Gaseous distention of the stomach. Findings concerning for small bowel obstruction. No free air. No organomegaly. Heavily calcified splenic artery in the left upper quadrant. Calcified phleboliths in the left pelvis. Severe leftward lumbar scoliosis with advanced degenerative changes.  IMPRESSION: Small bowel dilatation with gaseous distention of the stomach. Findings concerning for small bowel obstruction.   Electronically Signed   By: Rolm Baptise M.D.   On: 10/18/2014 15:31  Results/Tests Pending at Time of Discharge: none    Leone Brand, MD 2014-10-29, 8:00 AM PGY-2, [place]

## 2014-10-31 DEATH — deceased
